# Patient Record
Sex: Male | Born: 1980 | Race: White | Hispanic: No | Marital: Single | Smoking: Former smoker
Health system: Southern US, Community
[De-identification: ages and names within clinical notes are randomized; demographics above are authoritative.]

## PROBLEM LIST (undated history)

## (undated) VITALS — BP 144/95 | HR 96 | Temp 97.9°F | Resp 18 | Ht 71.0 in | Wt 200.0 lb

## (undated) DIAGNOSIS — F319 Bipolar disorder, unspecified: Secondary | ICD-10-CM

## (undated) DIAGNOSIS — F32A Depression, unspecified: Secondary | ICD-10-CM

## (undated) DIAGNOSIS — R011 Cardiac murmur, unspecified: Secondary | ICD-10-CM

## (undated) DIAGNOSIS — F329 Major depressive disorder, single episode, unspecified: Secondary | ICD-10-CM

## (undated) DIAGNOSIS — F1910 Other psychoactive substance abuse, uncomplicated: Secondary | ICD-10-CM

---
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
## 2002-09-04 ENCOUNTER — Ambulatory Visit (HOSPITAL_COMMUNITY): Admission: RE | Admit: 2002-09-04 | Discharge: 2002-09-04 | Payer: Self-pay | Admitting: Family Medicine

## 2002-09-04 ENCOUNTER — Encounter: Payer: Self-pay | Admitting: Family Medicine

## 2003-05-18 ENCOUNTER — Inpatient Hospital Stay (HOSPITAL_COMMUNITY): Admission: EM | Admit: 2003-05-18 | Discharge: 2003-05-21 | Payer: Self-pay | Admitting: Psychiatry

## 2003-12-31 ENCOUNTER — Emergency Department (HOSPITAL_COMMUNITY): Admission: EM | Admit: 2003-12-31 | Discharge: 2003-12-31 | Payer: Self-pay

## 2006-07-13 ENCOUNTER — Ambulatory Visit: Payer: Self-pay | Admitting: Psychiatry

## 2006-07-13 ENCOUNTER — Inpatient Hospital Stay (HOSPITAL_COMMUNITY): Admission: AD | Admit: 2006-07-13 | Discharge: 2006-07-15 | Payer: Self-pay | Admitting: Psychiatry

## 2008-03-31 ENCOUNTER — Emergency Department (HOSPITAL_COMMUNITY): Admission: EM | Admit: 2008-03-31 | Discharge: 2008-03-31 | Payer: Self-pay | Admitting: Emergency Medicine

## 2009-01-22 ENCOUNTER — Emergency Department (HOSPITAL_COMMUNITY): Admission: EM | Admit: 2009-01-22 | Discharge: 2009-01-22 | Payer: Self-pay | Admitting: Emergency Medicine

## 2009-06-04 HISTORY — PX: BACK SURGERY: SHX140

## 2009-09-02 ENCOUNTER — Ambulatory Visit (HOSPITAL_COMMUNITY): Payer: Self-pay | Admitting: Psychiatry

## 2009-09-21 ENCOUNTER — Ambulatory Visit (HOSPITAL_COMMUNITY): Payer: Self-pay | Admitting: Psychiatry

## 2009-10-12 ENCOUNTER — Ambulatory Visit (HOSPITAL_COMMUNITY): Payer: Self-pay | Admitting: Psychiatry

## 2010-02-14 ENCOUNTER — Ambulatory Visit (HOSPITAL_COMMUNITY): Admission: RE | Admit: 2010-02-14 | Discharge: 2010-02-14 | Payer: Self-pay | Admitting: Psychiatry

## 2010-05-07 ENCOUNTER — Ambulatory Visit (HOSPITAL_COMMUNITY)
Admission: RE | Admit: 2010-05-07 | Discharge: 2010-05-07 | Payer: Self-pay | Source: Home / Self Care | Admitting: Psychiatry

## 2010-05-07 ENCOUNTER — Emergency Department (HOSPITAL_COMMUNITY)
Admission: EM | Admit: 2010-05-07 | Discharge: 2010-05-08 | Payer: Self-pay | Source: Home / Self Care | Admitting: Emergency Medicine

## 2010-06-25 ENCOUNTER — Encounter: Payer: Self-pay | Admitting: Family Medicine

## 2010-08-05 ENCOUNTER — Emergency Department (HOSPITAL_COMMUNITY)
Admission: EM | Admit: 2010-08-05 | Discharge: 2010-08-06 | Disposition: A | Discharge status: Home / Self Care | Payer: Medicare Other | Attending: Emergency Medicine | Admitting: Emergency Medicine

## 2010-08-05 DIAGNOSIS — S025XXA Fracture of tooth (traumatic), initial encounter for closed fracture: Secondary | ICD-10-CM | POA: Insufficient documentation

## 2010-08-05 DIAGNOSIS — Y9229 Other specified public building as the place of occurrence of the external cause: Secondary | ICD-10-CM | POA: Insufficient documentation

## 2010-08-05 DIAGNOSIS — S0083XA Contusion of other part of head, initial encounter: Secondary | ICD-10-CM | POA: Insufficient documentation

## 2010-08-05 DIAGNOSIS — S0003XA Contusion of scalp, initial encounter: Secondary | ICD-10-CM | POA: Insufficient documentation

## 2010-08-05 DIAGNOSIS — IMO0002 Reserved for concepts with insufficient information to code with codable children: Secondary | ICD-10-CM | POA: Insufficient documentation

## 2010-08-05 DIAGNOSIS — S01501A Unspecified open wound of lip, initial encounter: Secondary | ICD-10-CM | POA: Insufficient documentation

## 2010-08-06 ENCOUNTER — Emergency Department (HOSPITAL_COMMUNITY): Payer: Medicare Other

## 2010-08-15 LAB — COMPREHENSIVE METABOLIC PANEL WITH GFR
ALT: 54 U/L — ABNORMAL HIGH (ref 0–53)
AST: 30 U/L (ref 0–37)
Albumin: 4.8 g/dL (ref 3.5–5.2)
Alkaline Phosphatase: 74 U/L (ref 39–117)
BUN: 11 mg/dL (ref 6–23)
CO2: 26 meq/L (ref 19–32)
Calcium: 9.8 mg/dL (ref 8.4–10.5)
Chloride: 102 meq/L (ref 96–112)
Creatinine, Ser: 1.14 mg/dL (ref 0.4–1.5)
GFR calc Af Amer: >60 mL/min (ref >60)
GFR calc non Af Amer: >60 mL/min (ref >60)
Glucose, Bld: 93 mg/dL (ref 70–99)
Potassium: 4.6 meq/L (ref 3.5–5.1)
Sodium: 139 meq/L (ref 135–145)
Total Bilirubin: 1.4 mg/dL — ABNORMAL HIGH (ref 0.3–1.2)
Total Protein: 7.8 g/dL (ref 6.0–8.3)

## 2010-08-15 LAB — DIFFERENTIAL
Basophils Relative: 0 % (ref 0–1)
Lymphocytes Relative: 30 % (ref 12–46)
Monocytes Absolute: 0.9 10*3/uL (ref 0.1–1.0)
Monocytes Relative: 9 % (ref 3–12)
Neutro Abs: 6.2 10*3/uL (ref 1.7–7.7)
Neutrophils Relative %: 61 % (ref 43–77)

## 2010-08-15 LAB — RAPID URINE DRUG SCREEN, HOSP PERFORMED
Amphetamines: NOT DETECTED
Barbiturates: NOT DETECTED
Benzodiazepines: NOT DETECTED
Cocaine: POSITIVE — AB
Opiates: NOT DETECTED
Tetrahydrocannabinol: NOT DETECTED

## 2010-08-15 LAB — CBC
HCT: 46.5 % (ref 39.0–52.0)
Hemoglobin: 15.9 g/dL (ref 13.0–17.0)
MCH: 30.7 pg (ref 26.0–34.0)
MCHC: 34.2 g/dL (ref 30.0–36.0)
MCV: 89.8 fL (ref 78.0–100.0)
Platelets: 244 K/uL (ref 150–400)
RBC: 5.18 MIL/uL (ref 4.22–5.81)
RDW: 13.2 % (ref 11.5–15.5)
WBC: 10.2 K/uL (ref 4.0–10.5)

## 2010-10-20 NOTE — Discharge Summary (Signed)
[name], [name]              ACCOUNT NO.:  [id number]   MEDICAL RECORD NO.:  [id number]          PATIENT TYPE:  IPS   LOCATION:  0506                          FACILITY:  [facility]   PHYSICIAN:  Geoffery Lyons, M.D.      DATE OF BIRTH:  1981/04/08   DATE OF ADMISSION:  07/13/2006  DATE OF DISCHARGE:  07/15/2006                               DISCHARGE SUMMARY   CHIEF COMPLAINT AND PRESENT ILLNESS:  This was the first admission to  [facility] for this 30 year old single white male.  Presented as a walk-in.  Reported he has had a history of bipolar  disorder.  Outpatient treatment with [name] at [facility].  His medications were increased two weeks prior to  this admission despite this.  He still has increased depression,  irritability, insomnia, poor appetite.  Reporting weight loss.  Currently homeless, living in his car.  He text messaged his family  saying he was going to kill himself but he had no plan but he felt  unstable.   PAST PSYCHIATRIC HISTORY:  He was admitted in 2004.  Seeing [name]  on an outpatient basis.   ALCOHOL/DRUG HISTORY:  Smokes one-third pack of cigarettes per day.  Denies any other substances.   MEDICAL HISTORY:  Noncontributory.   MEDICATIONS:  Wellbutrin XL 300 mg in the morning, Depakote ER 1000 mg  at bedtime.   PHYSICAL EXAMINATION:  Performed and failed to show any acute findings.   LABORATORY DATA:  Not available in the chart.   MENTAL STATUS EXAM:  Alert, cooperative male.  Appropriately groomed and  dressed.  Speech was not pressured, normal rate, tempo and production.  Feeling depressed.  Affect was depressed.  Thought processes were clear,  rational and goal-oriented.  Wanted his medications to be changed.  No  active delusions.  No active suicidal or homicidal ideation.  No  hallucinations.  Cognition was well-preserved.   ADMISSION DIAGNOSES:  AXIS I:  Bipolar disorder, depressed  without  psychotic features.  AXIS II:  No diagnosis.  AXIS III:  No diagnosis.  AXIS IV:  Moderate.  AXIS V:  GAF upon admission 35; highest GAF in the last year 60.   HOSPITAL COURSE:  He was admitted.  He was started in individual and  group psychotherapy.  He refused the Depakote, started on Abilify 30 mg  per day.  He did endorse initially upon admission that he was suicidal.  Stressors were that he was homeless, kicked out from his mother's house  as he was accused by her sister that his stole $30.  Did admit to using  alcohol.  Decreased sleep, decreased appetite.  Felt Depakote was not  working.  He endorsed he felt better after one dose of Abilify 30 mg.  On July 14, 2006, he was not wanting to talk about any of his  issues.  Wanted to be discharged.  On the increased dose of Depakote, he  felt he was out of control.  It was prescribed by [name].  Doing  much better on Abilify,  motivated to pursue medications further.  Wanted  to leave as he was having to get back to his job.  Family session with  his parents.  They did say that he needed to take more responsibility  for his illness.  The family confronted him that it was very emotionally  draining when he was tries to hurt himself.  They wanted him to find a  job and be proactive in his treatments.  On July 15, 2006, he was in  full contact with reality.  There were no active suicidal or homicidal  ideation.  No hallucinations.  No delusions.  He endorsed he was not  doing very well on the Depakote.  Also he was with friends.  He drank  three beers.  Endorsed he is not used to drinking and this might set the  stage to have more mood fluctuation.  Stopped feeling like killing  himself.  Denied suicidal ideation.  Endorsed that he really did not  want to hurt himself.  Felt that the Abilify was working well.  He felt  he was ready to go.  Needed to get a job.  He was going to stay with a  friend.  Endorsed that he  was going to be in a stable place.   DISCHARGE DIAGNOSES:  AXIS I:  Bipolar disorder, depressed.  AXIS II:  No diagnosis.  AXIS III:  No diagnosis.  AXIS IV:  Moderate.  AXIS V:  GAF upon discharge 55-60.   DISCHARGE MEDICATIONS:  1. Abilify 30 mg per day.  2. Wellbutrin XL 300 mg in the morning.   FOLLOWUP:  [facility].      Geoffery Lyons, M.D.  Electronically Signed     IL/MEDQ  D:  08/05/2006  T:  08/06/2006  Job:  [id number]

## 2010-10-20 NOTE — H&P (Signed)
[name], [name]              ACCOUNT NO.:  [id number]   MEDICAL RECORD NO.:  [id number]          PATIENT TYPE:  IPS   LOCATION:  0506                          FACILITY:  BH   PHYSICIAN:  Geoffery Lyons, M.D.      DATE OF BIRTH:  10-11-1980   DATE OF ADMISSION:  07/13/2006  DATE OF DISCHARGE:                       PSYCHIATRIC ADMISSION ASSESSMENT   DIAGNOSES:   AXIS I:  Bipolar, depressed, without psychotic features.   AXIS II:  Deferred.   AXIS III:  None known.   AXIS IV:  Severe, homeless, unemployed.   AXIS V:  Thirty-five.   This is a voluntary admission to the services of Dr. Geoffery Lyons.   IDENTIFYING INFORMATION:  This is a 30 year old, single, white male.  He  presented as a walk-in last evening.  He reported that he has a history  for bipolar disorder.  He is on outpatient treatment with [name] at  [facility].  He states his meds were increased two  weeks ago and despite this, he still has increased depression,  irritability, insomnia, and poor appetite, and he is reporting weight  loss.  He is currently homeless and living in his car.  Apparently he  text-messaged his family saying he was going to kill himself, but he had  no plan, but he felt unstable.   PAST PSYCHIATRIC HISTORY:  He was here in 2004.  As already stated, he  is in outpatient therapy with [name] at [facility].   SOCIAL HISTORY:  He completed high school in '02, he has never married,  he has no children.  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   FAMILY HISTORY:  He states that both sides have depression.   ALCOHOL AND DRUG HISTORY:  He acknowledges smoking one-third pack of  cigarettes per day.   MEDICAL CARE [name]:  Dr. Tiburcio Pea at [place]; his psychiatrist is Dr.  Allyne Gee at [facility].   He has  no known medical problems.   CURRENTLY PRESCRIBED MEDICATIONS:  1. Wellbutrin-XL 300 mg p.o. q.a.m.  2. Depakote-ER 1000 mg at h.s.   DRUG ALLERGIES:  No known drug allergies.   PHYSICAL EXAMINATION:  GENERAL:  A well-developed, well-nourished, white  male in no acute distress.  He does not appear to have worrisome weight  loss.  He had no positive physical findings.  VITAL SIGNS:  His vital  signs on admission show he is 71 inches tall, he weighs 165, his  temperature is 97.2, blood pressure is 126/81, pulse is 85.   I have a few labs.  His CBC was within normal limits, and his other labs  are currently pending.   MENTAL STATUS EXAM:  Today, he is alert and oriented x3.  He is  appropriately groomed, dressed, and nourished.  His speech is not  pressured.  He reports feeling depressed 3 out of a scale of 10.  His  affect has a normal  range.  His thought processes are clear, rational,  and goal oriented.  He wants to change medications.  Judgment and  insight are intact.  Concentration and memory are intact.  Intelligence  is at least average.  He denies being suicidal or homicidal at this  time.  He denies auditory or visual hallucinations.   PLAN:  Admit for safety and stabilization.  As he has taken Wellbutrin  and Depakote for over a year and despite recent increase in dosage, he  denies any response to these meds.  We will change them; also, he  refuses to take them.  We will try Abilify and will have a family  session with the counselor for discharge planning.      Mickie Leonarda Salon, P.A.-C.    ______________________________  Geoffery Lyons, M.D.    MD/MEDQ  D:  07/13/2006  T:  07/13/2006  Job:  [id number]

## 2010-10-20 NOTE — Discharge Summary (Signed)
NAME:  [name], [name]                        ACCOUNT NO.:  [id number]   MEDICAL RECORD NO.:  [id number]                   PATIENT TYPE:  IPS   LOCATION:  0505                                 FACILITY:  BH   PHYSICIAN:  Geoffery Lyons, M.D.                   DATE OF BIRTH:  15-Sep-1980   DATE OF ADMISSION:  05/18/2003  DATE OF DISCHARGE:  05/21/2003                                 DISCHARGE SUMMARY   CHIEF COMPLAINT AND PRESENT ILLNESS:  This was the first admission to [facility] for this 30 year old single white male.  He  presented accompanied by the parents.  He told them he needed to go to the  hospital, feeling agitated.  He was fed up with life.  He felt that he would  hurt himself.  He endorsed depression and hopelessness after learning the  day before that he was rejected from [facility].   PAST PSYCHIATRIC HISTORY:  This was the first time at [facility].  He had seen Christene Slates. Katrinka Blazing, M.D., on an outpatient basis.  He was  in [facility] at age 27 and diagnosed bipolar.   SUBSTANCE ABUSE HISTORY:  Occasional use of alcohol; claimed no persistent  use.  No other substances.   PAST MEDICAL HISTORY:  Noncontributory.   MEDICATIONS:  1. Paxil 20 mg per day.  2. Topamax 100 mg at night.  3. Risperdal 2 mg twice a day.   PHYSICAL EXAMINATION:  Physical examination was performed, failed to show  any acute findings.   MENTAL STATUS EXAM:  Mental status exam revealed a fully alert, calm,  focused, polite, cooperative male.  Speech: Normal pace, tone.  Mood:  Depressed, frustrated.  Affect: Depressed.  Thought processes: Logical,  coherent, and relevant.  No evidence of delusions, positive suicidal  ideation without a plan, no homicidal ideas.  Cognitive: Cognition was well  preserved.   ADMISSION DIAGNOSES:   AXIS I:  1. Bipolar disorder.  2. Mood disorder, not otherwise specified.   AXIS II:  No diagnosis.   AXIS III:  No  diagnosis.   AXIS IV:  Moderate.   AXIS V:  Global assessment of functioning upon admission 36, highest global  assessment of functioning in the last year 65.   LABORATORY DATA:  CBC was within normal limits.  Blood chemistries were  within normal limits.  Hemoglobin A2 2.6, hemoglobin F 0.  Lipid profile:  Within normal limits.   HOSPITAL COURSE:  He was admitted and started in intensive individual and  group psychotherapy.  He was given Risperdal 2 mg twice a day and Paxil 20  mg per day.  He was placed on Topamax 100 mg at night.  He did endorse a  past history of irritability, loss of control, anger for not being able to  go to a rest home.  He has had other  episodes of anger and loss of control,  intimidating toward other people.  Still willing to be placed, expressed  some increased insight in terms of the need to learn of ways to control his  emotions.  He did quite well and on December 17, he was in full contact with  reality.  There were no suicidal ideas, no homicidal ideas, no  hallucinations, no delusions.  He was going to stay with the parents and  eventually make it to go to a group home.  He was positive about his plan,  had worked on Counsellor and anger management.  As  he was not suicidal and there was evidence of improvement, we went ahead and  discharged to outpatient followup.   DISCHARGE DIAGNOSES:   AXIS I:  Mood disorder, not otherwise specified.   AXIS II:  No diagnosis.   AXIS III:  No diagnosis.   AXIS IV:  Moderate.   AXIS V:  Global assessment of functioning upon discharge 50.   DISCHARGE MEDICATIONS:  1. Risperdal 2 mg twice a day.  2. Paxil 20 mg a day.  3. Topamax 100 mg at night.   FOLLOW UP:  He was to follow up with Christene Slates. Katrinka Blazing, M.D.                                               Geoffery Lyons, M.D.    IL/MEDQ  D:  06/16/2003  T:  06/17/2003  Job:  [id number]

## 2010-10-22 ENCOUNTER — Emergency Department (HOSPITAL_COMMUNITY)
Admission: EM | Admit: 2010-10-22 | Discharge: 2010-10-22 | Disposition: A | Discharge status: Home / Self Care | Payer: Medicare Other | Attending: Emergency Medicine | Admitting: Emergency Medicine

## 2010-10-22 ENCOUNTER — Emergency Department (HOSPITAL_COMMUNITY): Payer: Medicare Other

## 2010-10-22 DIAGNOSIS — W268XXA Contact with other sharp object(s), not elsewhere classified, initial encounter: Secondary | ICD-10-CM | POA: Insufficient documentation

## 2010-10-22 DIAGNOSIS — F3289 Other specified depressive episodes: Secondary | ICD-10-CM | POA: Insufficient documentation

## 2010-10-22 DIAGNOSIS — IMO0002 Reserved for concepts with insufficient information to code with codable children: Secondary | ICD-10-CM | POA: Insufficient documentation

## 2010-10-22 DIAGNOSIS — F329 Major depressive disorder, single episode, unspecified: Secondary | ICD-10-CM | POA: Insufficient documentation

## 2010-10-22 DIAGNOSIS — Y92009 Unspecified place in unspecified non-institutional (private) residence as the place of occurrence of the external cause: Secondary | ICD-10-CM | POA: Insufficient documentation

## 2011-08-06 ENCOUNTER — Emergency Department (HOSPITAL_COMMUNITY)
Admission: EM | Admit: 2011-08-06 | Discharge: 2011-08-06 | Disposition: A | Discharge status: Home / Self Care | Payer: Medicare Other | Attending: Emergency Medicine | Admitting: Emergency Medicine

## 2011-08-06 ENCOUNTER — Ambulatory Visit (HOSPITAL_COMMUNITY)
Admission: RE | Admit: 2011-08-06 | Discharge: 2011-08-06 | Disposition: A | Discharge status: Home / Self Care | Payer: Medicare Other | Source: Home / Self Care | Attending: Psychiatry | Admitting: Psychiatry

## 2011-08-06 ENCOUNTER — Encounter (HOSPITAL_COMMUNITY): Payer: Self-pay | Admitting: *Deleted

## 2011-08-06 DIAGNOSIS — F3289 Other specified depressive episodes: Secondary | ICD-10-CM | POA: Insufficient documentation

## 2011-08-06 DIAGNOSIS — F329 Major depressive disorder, single episode, unspecified: Secondary | ICD-10-CM | POA: Insufficient documentation

## 2011-08-06 DIAGNOSIS — F1410 Cocaine abuse, uncomplicated: Secondary | ICD-10-CM | POA: Insufficient documentation

## 2011-08-06 HISTORY — DX: Bipolar disorder, unspecified: F31.9

## 2011-08-06 LAB — RAPID URINE DRUG SCREEN, HOSP PERFORMED
Amphetamines: NOT DETECTED
Barbiturates: NOT DETECTED
Benzodiazepines: NOT DETECTED
Cocaine: POSITIVE — AB
Opiates: NOT DETECTED
Tetrahydrocannabinol: NOT DETECTED

## 2011-08-06 LAB — COMPREHENSIVE METABOLIC PANEL
ALT: 12 U/L (ref 0–53)
Alkaline Phosphatase: 81 U/L (ref 39–117)
BUN: 8 mg/dL (ref 6–23)
GFR calc Af Amer: >90 mL/min (ref >90)
Potassium: 4.3 mEq/L (ref 3.5–5.1)
Sodium: 140 mEq/L (ref 135–145)
Total Protein: 7.4 g/dL (ref 6.0–8.3)

## 2011-08-06 LAB — ETHANOL: Alcohol, Ethyl (B): <11 mg/dL (ref 0–11)

## 2011-08-06 LAB — CBC
HCT: 45.9 % (ref 39.0–52.0)
Hemoglobin: 15.5 g/dL (ref 13.0–17.0)
MCH: 30.8 pg (ref 26.0–34.0)
MCHC: 33.8 g/dL (ref 30.0–36.0)
MCV: 91.1 fL (ref 78.0–100.0)
Platelets: 282 K/uL (ref 150–400)
RBC: 5.04 MIL/uL (ref 4.22–5.81)
RDW: 13.3 % (ref 11.5–15.5)
WBC: 7.5 K/uL (ref 4.0–10.5)

## 2011-08-06 LAB — DIFFERENTIAL
Basophils Absolute: 0 K/uL (ref 0.0–0.1)
Basophils Relative: 0 % (ref 0–1)
Eosinophils Absolute: 0.1 K/uL (ref 0.0–0.7)
Eosinophils Relative: 2 % (ref 0–5)
Lymphocytes Relative: 24 % (ref 12–46)
Lymphs Abs: 1.8 K/uL (ref 0.7–4.0)
Monocytes Absolute: 0.3 K/uL (ref 0.1–1.0)
Monocytes Relative: 5 % (ref 3–12)
Neutro Abs: 5.2 K/uL (ref 1.7–7.7)
Neutrophils Relative %: 69 % (ref 43–77)

## 2011-08-06 MED ORDER — ACETAMINOPHEN 325 MG PO TABS: 650.0000 mg | ORAL_TABLET | ORAL | Status: DC | PRN

## 2011-08-06 MED ORDER — LORAZEPAM 1 MG PO TABS
1.0000 mg | ORAL_TABLET | Freq: Three times a day (TID) | ORAL | Status: DC | PRN
  Administered 2011-08-06: 1 mg via ORAL
  Filled 2011-08-06: qty 1

## 2011-08-06 MED ORDER — NICOTINE 21 MG/24HR TD PT24: 21.0000 mg | MEDICATED_PATCH | Freq: Every day | TRANSDERMAL | Status: DC

## 2011-08-06 MED ORDER — ESCITALOPRAM OXALATE 10 MG PO TABS: 10.0000 mg | ORAL_TABLET | Freq: Every day | ORAL | Status: DC

## 2011-08-06 MED ORDER — ZOLPIDEM TARTRATE 5 MG PO TABS: 5.0000 mg | ORAL_TABLET | Freq: Every evening | ORAL | Status: DC | PRN

## 2011-08-06 MED ORDER — IBUPROFEN 600 MG PO TABS: 600.0000 mg | ORAL_TABLET | Freq: Three times a day (TID) | ORAL | Status: DC | PRN

## 2011-08-06 MED ORDER — ONDANSETRON HCL 4 MG PO TABS: 4.0000 mg | ORAL_TABLET | Freq: Three times a day (TID) | ORAL | Status: DC | PRN

## 2011-08-06 NOTE — BH Assessment (Signed)
Assessment Note   [name] is an 31 y.o. male. PT PRESENTS WITH INCREASE DEPRESSION & SUICIDAL THOUGHTS WITH A PLAN TO OVERDOSE. PT EXPRESSED THAT HE FELT LIKE HE HAS HIT ROCK BOTTOM AFTER GIRLFRIEND OF 4 YRS BROKE UP WITH HIM & HIM WHICH DURING THAT TIME HE WAS ABUSING COCAINE TO COPE WITH NOT BEING ON MEDS. PT STATES HE ALSO JUST RECENTLY LOST HIS JOBS WHICH HAS ALSO CAUSE FINANCIAL STRAIN. PT WAS EMOTIONAL & TEARFUL, FEELS HELPLESS AS WELL AS LONELY. PT ADMITS HE HAS NT BEEN COMPLAINT WITH MEDS. PT SAYS HE HAS BEEN SUICIDAL X 3 WEEKS & RECENTLY FOUND A BOTTLE OF RESPERIDAL WHICH HE WANTED TO  OVERDOSE ON. PT SAYS HE DRINKS ETOH OCCASIONALLY ON WEEKENDS 2-3(24OZ) & THC 3(JOINTS); PT SAYS LAST USE OF COCAINE WAS 2 YRS AGO. PT DENIES HI OR AV. PT EXPRESSED THAT HE COULD NOT CONTRACT FOR SAFETY BUT ADMITS TO UNSTABLE MOODS. PT DENIES HI OR AV.   Axis I: Bipolar, mixed and Substance Induced Mood Disorder Axis II: Deferred Axis III: No past medical history on file. Axis IV: economic problems, occupational problems, other psychosocial or environmental problems, problems related to social environment and problems with primary support group Axis V: 11-20 some danger of hurting self or others possible OR occasionally fails to maintain minimal personal hygiene OR gross impairment in communication  Past Medical History: No past medical history on file.  No past surgical history on file.  Family History: No family history on file.  Social History:  does not have a smoking history on file. He does not have any smokeless tobacco history on file. His alcohol and drug histories not on file.  Additional Social History:    Allergies: Allergies not on file  Home Medications:  No current facility-administered medications on file as of 08/06/2011.   No current outpatient prescriptions on file as of 08/06/2011.    OB/GYN Status:  No LMP for male patient.  General Assessment Data Location of  Assessment: [facility] Living Arrangements: Alone Can pt return to current living arrangement?: Yes Admission Status: Voluntary Is patient capable of signing voluntary admission?: Yes Transfer from: Acute Hospital Referral Source: Self/Family/Friend     Risk to self Suicidal Ideation: Yes-Currently Present Suicidal Intent: Yes-Currently Present Is patient at risk for suicide?: Yes Suicidal Plan?: Yes-Currently Present Specify Current Suicidal Plan: OVERDOSE ON PILLS Access to Means: Yes Specify Access to Suicidal Means: PILLS What has been your use of drugs/alcohol within the last 12 months?: PT ADMITS TO OCCASIONAL THC & LAS T USE WAS 2 WEEKS AGO; ETOH USE WHICH HE DRINKS 3(24OZ) ON WEEKENDS & LAST USE WAS OVER THE WEEKEND Previous Attempts/Gestures: Yes How many times?: 8  Other Self Harm Risks: NA Triggers for Past Attempts: Unpredictable;Other personal contacts Intentional Self Injurious Behavior: None Family Suicide History: Unknown Recent stressful life event(s): Loss (Comment);Financial Problems;Turmoil (Comment) Persecutory voices/beliefs?: No Depression: Yes Depression Symptoms: Loss of interest in usual pleasures;Feeling worthless/self pity Substance abuse history and/or treatment for substance abuse?: Yes Suicide prevention information given to non-admitted patients: Not applicable  Risk to Others Homicidal Ideation: No Thoughts of Harm to Others: No Current Homicidal Intent: No Current Homicidal Plan: No Access to Homicidal Means: No Identified Victim: NA History of harm to others?: No Assessment of Violence: None Noted Violent Behavior Description: CALM, COOPERATIVE, EMOTIONAL & TEARFUL Does patient have access to weapons?: No Criminal Charges Pending?: No Does patient have a court date: No  Psychosis Hallucinations: None noted  Delusions: None noted  Mental Status Report Appear/Hygiene: Body odor Eye Contact: Good Motor Activity: Freedom  of movement Speech: Logical/coherent Level of Consciousness: Alert Mood: Depressed;Helpless;Sad Affect: Depressed;Sad Anxiety Level: Moderate Thought Processes: Coherent;Relevant Judgement: Unimpaired Orientation: Person;Place;Time;Situation Obsessive Compulsive Thoughts/Behaviors: None  Cognitive Functioning Concentration: Decreased Memory: Recent Intact;Remote Intact IQ: Average Insight: Poor Impulse Control: Poor Appetite: Fair Weight Loss: 0  Weight Gain: 0  Sleep: Decreased Total Hours of Sleep: 4  Vegetative Symptoms: None  Prior Inpatient Therapy Prior Inpatient Therapy: Yes Prior Therapy Dates: UNK Prior Therapy Facilty/Provider(s): [facility]; [place] Reason for Treatment: STABILIZATION  Prior Outpatient Therapy Prior Outpatient Therapy: No Prior Therapy Dates: NA Prior Therapy Facilty/Provider(s): NA Reason for Treatment: NA                     Additional Information 1:1 In Past 12 Months?: No CIRT Risk: No Elopement Risk: No Does patient have medical clearance?: Yes     Disposition:  Disposition Disposition of Patient: Inpatient treatment program;Referred to (ACCEPTED BY Rogelia Rohrer, NP PENDING MEDICAL CLEARANCE) Type of inpatient treatment program: Adult  On Site Evaluation by:   Reviewed with Physician:     Waldron Session 08/06/2011 1:40 PM

## 2011-08-06 NOTE — ED Notes (Signed)
Pt initially requested to leave, now says he wants to stay and get treatment, pt tearful/crying, feels like he has let his family down because he relasped on drugs and now has lost his girlfriend who he wants to marry.

## 2011-08-06 NOTE — ED Notes (Signed)
C/o +SI earlier today but at this time is no longer SI but does states he has increased depression. All this began about 3 weeks ago b/c he and his GF broke up, he denies Si or HI at this time as well as AV/H. Has been on Risperdal 2 mg every day. Is pleasant and cooperative. Hopes he can get things worked out w/his GF., needs to change his living arrangements, is FT on his job but needs to change to PT.

## 2011-08-06 NOTE — ED Provider Notes (Signed)
History     CSN: [id number]  Arrival date & time 08/06/11  1325   First MD Initiated Contact with Patient 08/06/11 1504      Chief Complaint  Patient presents with  . Medical Clearance    The history is provided by the patient.   patient reports a long-standing history of depression bipolar disorder.  He is currently on rest for down 2 mg daily.  He reports recently breaking up with his girlfriend and 4 days ago lost his current employment.  He reports ongoing depression and vague suicidal thoughts today.  He has no plan.  He reports "I'll never kill myself because I'm too chicken".  He denies daily alcohol use.  He denies drug abuse.  He comes the ER today requesting help with his depression and his medications.  Past Medical History  Diagnosis Date  . Bipolar 1 disorder     Past Surgical History  Procedure Date  . Back surgery     History reviewed. No pertinent family history.  History  Substance Use Topics  . Smoking status: Current Everyday Smoker  . Smokeless tobacco: Never Used  . Alcohol Use: Yes     occa      Review of Systems  All other systems reviewed and are negative.    Allergies  Review of patient's allergies indicates no known allergies.  Home Medications   Current Outpatient Rx  Name Route Sig Dispense Refill  . ALBUTEROL SULFATE HFA 108 (90 BASE) MCG/ACT IN AERS Inhalation Inhale 2 puffs into the lungs every 6 (six) hours as needed. For shortness of breath    . RISPERIDONE 2 MG PO TABS Oral Take 2 mg by mouth daily.    Marland Kitchen VITAMIN C 500 MG PO TABS Oral Take 500 mg by mouth daily.      BP 135/84  Pulse 68  Temp(Src) 97.7 F (36.5 C) (Oral)  Resp 20  Ht 5\' 11"  (1.803 m)  Wt 170 lb (77.111 kg)  BMI 23.71 kg/m2  SpO2 99%  Physical Exam  Nursing note and vitals reviewed. Constitutional: He is oriented to person, place, and time. He appears well-developed and well-nourished.  HENT:  Head: Normocephalic and atraumatic.  Eyes: EOM are  normal.  Neck: Normal range of motion.  Cardiovascular: Normal rate, regular rhythm, normal heart sounds and intact distal pulses.   Pulmonary/Chest: Effort normal and breath sounds normal. No respiratory distress.  Abdominal: Soft. He exhibits no distension. There is no tenderness.  Musculoskeletal: Normal range of motion.  Neurological: He is alert and oriented to person, place, and time.  Skin: Skin is warm and dry.  Psychiatric: Judgment normal.       Flat affect    ED Course  Procedures (including critical care time)  Labs Reviewed  URINE RAPID DRUG SCREEN (HOSP PERFORMED) - Abnormal; Notable for the following:    Cocaine POSITIVE (*)    All other components within normal limits  ETHANOL  CBC  DIFFERENTIAL  COMPREHENSIVE METABOLIC PANEL   No results found.   1. Depression 2. Cocaine abuse    MDM  Depression with vague SI.  Am not completely convinced this patient will require admission to behavioral health this am not convinced he is a threat to himself or to others.  I will ask the the psychiatrist to evaluate the patient  10:44 PM The patient was evaluated by Dr Sela Hua MD, telepsychiatry.  His recommendations are as follows X. #1 discharge to self #2 followup with  mental health #3 continuous results milligrams daily #4 start Lexapro 10 mg daily        Lyanne Co, MD 08/06/11 2248

## 2011-08-06 NOTE — ED Notes (Signed)
Pt states he has started to be depressed and si thoughts. Pt denies hi. Pt states he does not have a plan he wants help. Pt states he went to b[facility] he was accepted but does not have a bed

## 2011-09-19 ENCOUNTER — Telehealth (HOSPITAL_COMMUNITY): Payer: Self-pay

## 2011-09-19 NOTE — BH Assessment (Signed)
Assessment Note      [name] is an 31 y.o. male. PT PRESENTS WITH INCREASE DEPRESSION & SUICIDAL THOUGHTS WITH A PLAN TO OVERDOSE. PT EXPRESSED THAT HE FELT LIKE HE HAS HIT ROCK BOTTOM AFTER GIRLFRIEND OF 4 YRS BROKE UP WITH HIM & HIM WHICH DURING THAT TIME HE WAS ABUSING COCAINE TO COPE WITH NOT BEING ON MEDS. PT STATES HE ALSO JUST RECENTLY LOST HIS JOBS WHICH HAS ALSO CAUSE FINANCIAL STRAIN. PT WAS EMOTIONAL & TEARFUL, FEELS HELPLESS AS WELL AS LONELY. PT ADMITS HE HAS NT BEEN COMPLAINT WITH MEDS. PT SAYS HE HAS BEEN SUICIDAL X 3 WEEKS & RECENTLY FOUND A BOTTLE OF RESPERIDAL WHICH HE WANTED TO OVERDOSE ON. PT SAYS HE DRINKS ETOH OCCASIONALLY ON WEEKENDS 2-3(24OZ) & THC 3(JOINTS); PT SAYS LAST USE OF COCAINE WAS 2 YRS AGO. PT DENIES HI OR AV. PT EXPRESSED THAT HE COULD NOT CONTRACT FOR SAFETY BUT ADMITS TO UNSTABLE MOODS. PT DENIES HI OR AV.  Axis I: Bipolar, mixed and Substance Induced Mood Disorder  Axis II: Deferred  Axis III: No past medical history on file.  Axis IV: economic problems, occupational problems, other psychosocial or environmental problems, problems related to social environment and problems with primary support group  Axis V: 11-20 some danger of hurting self or others possible OR occasionally fails to maintain minimal personal hygiene OR gross impairment in communication  Past Medical History: No past medical history on file.  No past surgical history on file.  Family History: No family history on file.  Social History: does not have a smoking history on file. He does not have any smokeless tobacco history on file. His alcohol and drug histories not on file.  Additional Social History:  Allergies: Allergies not on file  Home Medications:   No current facility-administered medications on file as of 08/06/2011.   No current outpatient prescriptions on file as of 08/06/2011.    OB/GYN Status: No LMP for male patient.  General Assessment Data  Location of Assessment: [facility]  Assessment Services  Living Arrangements: Alone  Can pt return to current living arrangement?: Yes  Admission Status: Voluntary  Is patient capable of signing voluntary admission?: Yes  Transfer from: Acute Hospital  Referral Source: Self/Family/Friend   Risk to self  Suicidal Ideation: Yes-Currently Present  Suicidal Intent: Yes-Currently Present  Is patient at risk for suicide?: Yes  Suicidal Plan?: Yes-Currently Present  Specify Current Suicidal Plan: OVERDOSE ON PILLS  Access to Means: Yes  Specify Access to Suicidal Means: PILLS  What has been your use of drugs/alcohol within the last 12 months?: PT ADMITS TO OCCASIONAL THC & LAS T USE WAS 2 WEEKS AGO; ETOH USE WHICH HE DRINKS 3(24OZ) ON WEEKENDS & LAST USE WAS OVER THE WEEKEND  Previous Attempts/Gestures: Yes  How many times?: 8  Other Self Harm Risks: NA  Triggers for Past Attempts: Unpredictable;Other personal contacts  Intentional Self Injurious Behavior: None  Family Suicide History: Unknown  Recent stressful life event(s): Loss (Comment);Financial Problems;Turmoil (Comment)  Persecutory voices/beliefs?: No  Depression: Yes  Depression Symptoms: Loss of interest in usual pleasures;Feeling worthless/self pity  Substance abuse history and/or treatment for substance abuse?: Yes  Suicide prevention information given to non-admitted patients: Not applicable  Risk to Others  Homicidal Ideation: No  Thoughts of Harm to Others: No  Current Homicidal Intent: No  Current Homicidal Plan: No  Access to Homicidal Means: No  Identified Victim: NA  History of harm to others?: No  Assessment of  Violence: None Noted  Violent Behavior Description: CALM, COOPERATIVE, EMOTIONAL & TEARFUL  Does patient have access to weapons?: No  Criminal Charges Pending?: No  Does patient have a court date: No  Psychosis  Hallucinations: None noted  Delusions: None noted  Mental Status Report  Appear/Hygiene: Body odor  Eye Contact: Good  Motor  Activity: Freedom of movement  Speech: Logical/coherent  Level of Consciousness: Alert  Mood: Depressed;Helpless;Sad  Affect: Depressed;Sad  Anxiety Level: Moderate  Thought Processes: Coherent;Relevant  Judgement: Unimpaired  Orientation: Person;Place;Time;Situation  Obsessive Compulsive Thoughts/Behaviors: None  Cognitive Functioning  Concentration: Decreased  Memory: Recent Intact;Remote Intact  IQ: Average  Insight: Poor  Impulse Control: Poor  Appetite: Fair  Weight Loss: 0  Weight Gain: 0  Sleep: Decreased  Total Hours of Sleep: 4  Vegetative Symptoms: None  Prior Inpatient Therapy  Prior Inpatient Therapy: Yes  Prior Therapy Dates: UNK  Prior Therapy Facilty/Provider(s): [facility]; [place]  Reason for Treatment: STABILIZATION  Prior Outpatient Therapy  Prior Outpatient Therapy: No  Prior Therapy Dates: NA  Prior Therapy Facilty/Provider(s): NA  Reason for Treatment: NA          Additional Information  1:1 In Past 12 Months?: No  CIRT Risk: No  Elopement Risk: No  Does patient have medical clearance?: Yes   Disposition:  Disposition  Disposition of Patient: Inpatient treatment program;Referred to (ACCEPTED BY Rogelia Rohrer, NP PENDING MEDICAL CLEARANCE)  Type of inpatient treatment program: Adult  On Site Evaluation by:  Reviewed with Physician:  Waldron Session  08/06/2011 1:40 PM

## 2012-11-01 ENCOUNTER — Encounter (HOSPITAL_COMMUNITY): Payer: Self-pay | Admitting: *Deleted

## 2012-11-01 ENCOUNTER — Telehealth (HOSPITAL_COMMUNITY): Payer: Self-pay | Admitting: *Deleted

## 2012-11-01 ENCOUNTER — Encounter (HOSPITAL_COMMUNITY): Payer: Self-pay

## 2012-11-01 ENCOUNTER — Inpatient Hospital Stay (HOSPITAL_COMMUNITY)
Admission: AD | Admit: 2012-11-01 | Discharge: 2012-11-03 | DRG: 885 | Disposition: A | Discharge status: Home / Self Care | Payer: Medicare Other | Attending: Psychiatry | Admitting: Psychiatry

## 2012-11-01 DIAGNOSIS — F411 Generalized anxiety disorder: Secondary | ICD-10-CM

## 2012-11-01 DIAGNOSIS — Z79899 Other long term (current) drug therapy: Secondary | ICD-10-CM

## 2012-11-01 DIAGNOSIS — F3130 Bipolar disorder, current episode depressed, mild or moderate severity, unspecified: Principal | ICD-10-CM | POA: Diagnosis present

## 2012-11-01 DIAGNOSIS — F319 Bipolar disorder, unspecified: Secondary | ICD-10-CM

## 2012-11-01 LAB — CBC WITH DIFFERENTIAL/PLATELET
Basophils Absolute: 0 10*3/uL (ref 0.0–0.1)
Eosinophils Absolute: 0.2 10*3/uL (ref 0.0–0.7)
Eosinophils Relative: 2 % (ref 0–5)
HCT: 45 % (ref 39.0–52.0)
Lymphocytes Relative: 33 % (ref 12–46)
MCH: 30.5 pg (ref 26.0–34.0)
MCV: 92.6 fL (ref 78.0–100.0)
Monocytes Absolute: 0.8 10*3/uL (ref 0.1–1.0)
Platelets: 235 10*3/uL (ref 150–400)
RDW: 13.1 % (ref 11.5–15.5)
WBC: 9.1 10*3/uL (ref 4.0–10.5)

## 2012-11-01 MED ORDER — ACETAMINOPHEN 325 MG PO TABS: 650.0000 mg | ORAL_TABLET | Freq: Four times a day (QID) | ORAL | Status: DC | PRN

## 2012-11-01 MED ORDER — ALUM & MAG HYDROXIDE-SIMETH 200-200-20 MG/5ML PO SUSP: 30.0000 mL | ORAL | Status: DC | PRN

## 2012-11-01 MED ORDER — VITAMIN C 500 MG PO TABS
500.0000 mg | ORAL_TABLET | Freq: Every day | ORAL | Status: DC
  Administered 2012-11-01 – 2012-11-03 (×3): 500 mg via ORAL
  Filled 2012-11-01 (×7): qty 1

## 2012-11-01 MED ORDER — GABAPENTIN 100 MG PO CAPS
100.0000 mg | ORAL_CAPSULE | Freq: Three times a day (TID) | ORAL | Status: DC
  Administered 2012-11-01 – 2012-11-03 (×6): 100 mg via ORAL
  Filled 2012-11-01 (×4): qty 1
  Filled 2012-11-01 (×3): qty 9
  Filled 2012-11-01 (×7): qty 1

## 2012-11-01 MED ORDER — DIVALPROEX SODIUM 500 MG PO DR TAB
500.0000 mg | DELAYED_RELEASE_TABLET | Freq: Every day | ORAL | Status: DC
  Administered 2012-11-01 – 2012-11-03 (×3): 500 mg via ORAL
  Filled 2012-11-01: qty 3
  Filled 2012-11-01 (×6): qty 1

## 2012-11-01 MED ORDER — ESCITALOPRAM OXALATE 10 MG PO TABS
10.0000 mg | ORAL_TABLET | Freq: Every day | ORAL | Status: DC
  Administered 2012-11-01 – 2012-11-03 (×3): 10 mg via ORAL
  Filled 2012-11-01 (×5): qty 1
  Filled 2012-11-01: qty 3
  Filled 2012-11-01 (×2): qty 1

## 2012-11-01 MED ORDER — MAGNESIUM HYDROXIDE 400 MG/5ML PO SUSP: 30.0000 mL | Freq: Every day | ORAL | Status: DC | PRN

## 2012-11-01 MED ORDER — TRAZODONE HCL 50 MG PO TABS
50.0000 mg | ORAL_TABLET | Freq: Every evening | ORAL | Status: DC | PRN
  Filled 2012-11-01: qty 3

## 2012-11-01 MED ORDER — NICOTINE 21 MG/24HR TD PT24
21.0000 mg | MEDICATED_PATCH | Freq: Every day | TRANSDERMAL | Status: DC
  Filled 2012-11-01 (×5): qty 1

## 2012-11-01 MED ORDER — RISPERIDONE 1 MG PO TABS
1.0000 mg | ORAL_TABLET | Freq: Every day | ORAL | Status: DC
  Administered 2012-11-02: 1 mg via ORAL
  Filled 2012-11-01 (×3): qty 1

## 2012-11-01 MED ORDER — ALBUTEROL SULFATE HFA 108 (90 BASE) MCG/ACT IN AERS: 2.0000 | INHALATION_SPRAY | Freq: Four times a day (QID) | RESPIRATORY_TRACT | Status: DC | PRN

## 2012-11-01 MED ORDER — RISPERIDONE 2 MG PO TABS
2.0000 mg | ORAL_TABLET | Freq: Every day | ORAL | Status: DC
  Filled 2012-11-01 (×2): qty 1

## 2012-11-01 MED ORDER — RISPERIDONE 0.5 MG PO TABS
0.5000 mg | ORAL_TABLET | Freq: Two times a day (BID) | ORAL | Status: DC
  Administered 2012-11-01 – 2012-11-03 (×4): 0.5 mg via ORAL
  Filled 2012-11-01 (×2): qty 1
  Filled 2012-11-01: qty 12
  Filled 2012-11-01 (×5): qty 1
  Filled 2012-11-01: qty 12
  Filled 2012-11-01: qty 1

## 2012-11-01 NOTE — BH Assessment (Deleted)
Assessment Note   [name] is a 32 y.o. single white male.  He goes by "[name]."  He presents to [facility] unaccompanied complaining of SI.  Pt reports several recent stressors, mostly related to relocating from [place] to [place] in 09/2012 after having moved there with his girlfriend in 01/2012.  Their relationship had persisted for 5 years, but they broke up around the same time that pt moved back to [place].  Pt attributes the break-up to excessive partying on his part.  Pt decided to return to [place] and move in with his father to be a support for him after his mother left the father for another man.  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  As a result, pt states, "I'm just tired of everything."  Pt reports that he has had SI "since I was 32 years old."  Recently he has had a plan to hang himself or overdose "and just not wake up."  He has never made a suicide attempt or engaged in self-mutilation.  When asked what prompted him to seek treatment rather than hurt himself today, pt states, "I know that things are going to get better.  There's a light at the end of the tunnel, I just can't see it yet."  Pt endorses depressed mood with symptoms noted in the "risk to self" assessment below.  He also reports problems with anxiety, increasing at times to near-panic level.  Pt denies HI or physical aggression, and is cooperative and calm, other than fidgeting with his cap.  Pt denies any problems with hallucinations, and he exhibits no signs of delusional thought or internal stimuli; his reality testing appears to be intact.  Pt denies any current substance abuse problems, but later divulged that he had used a gram of  cocaine last night and the night before.  He does not appear to be intoxicated or in withdrawal at this time.  Pt identifies his father as his only social support.  While they generally get along well, they both get aggravated and yell at each other from time to time.  Pt is currently receiving outpatient psychiatry from [facility], and has been prescribed Depakote 500 mg daily since 07/2011.  He has not had a valproic acid level taken recently.  He would like to see an outpatient therapist, but has not seen one since [facility] last year.  He has been admitted to [facility] on three occasions, in 2004, 2008, and 2013, and was also admitted to [facility] and to [facility] about 4 years ago.  He is requesting to be admitted to [facility] today.   Axis I: Bipolar Disorder NOS 296.80 Axis II: Deferred 799.9 Axis III:  Past Medical History  Diagnosis Date  . Bipolar 1 disorder    Axis IV: economic problems, occupational problems, problems with access to health care services and problems with primary support group Axis V: GAF = 35  Past Medical History:  Past Medical History  Diagnosis Date  . Bipolar 1 disorder     Past Surgical History  Procedure Laterality Date  . Back surgery      Family History: No family history on file.  Social History:  reports that  he has been smoking Cigarettes.  He has a 2.5 pack-year smoking history. He has never used smokeless tobacco. He reports that  drinks alcohol. He reports that he does not use illicit drugs.  Additional Social History:  Alcohol / Drug Use Pain Medications: Denies Prescriptions: Denies Over the Counter: Denies  CIWA:   COWS:    Allergies: No Known Allergies  Home Medications:  Medications Prior to Admission  Medication Sig Dispense Refill  . albuterol (PROVENTIL HFA;VENTOLIN HFA) 108 (90 BASE) MCG/ACT inhaler Inhale 2 puffs into the lungs every 6 (six) hours as needed. For shortness of breath      . divalproex (DEPAKOTE) 500 MG DR tablet  Take 500 mg by mouth daily. Started in 07/2012.  Reports no recent valproic acid level taken.      Marland Kitchen escitalopram (LEXAPRO) 10 MG tablet Take 1 tablet (10 mg total) by mouth daily.  30 tablet  1  . risperiDONE (RISPERDAL) 2 MG tablet Take 2 mg by mouth daily.      . vitamin C (ASCORBIC ACID) 500 MG tablet Take 500 mg by mouth daily.        OB/GYN Status:  No LMP for male patient.  General Assessment Data Location of Assessment: [facility] Assessment Services Living Arrangements: Parent (Father) Can pt return to current living arrangement?: Yes Admission Status: Voluntary Is patient capable of signing voluntary admission?: Yes Transfer from: Home Referral Source: Self/Family/Friend  Education Status Is patient currently in school?: No  Risk to self Suicidal Ideation: Yes-Currently Present Suicidal Intent: No Is patient at risk for suicide?: Yes Suicidal Plan?: Yes-Currently Present Specify Current Suicidal Plan: Hanging, overdose Access to Means: Yes Specify Access to Suicidal Means: Cords, medications What has been your use of drugs/alcohol within the last 12 months?: Denies Previous Attempts/Gestures: No How many times?: 0 Other Self Harm Risks: Limited outpatient support; social isolation including recent alienation from mother and break-up with girlfriend of 5 years. Triggers for Past Attempts: None known (Not applicable) Intentional Self Injurious Behavior: None Family Suicide History: No ("All of them are nuts."  Denies suicide attempts.) Recent stressful life event(s): Loss (Comment);Other (Comment);Financial Problems (Occupational/financial; estrangement from mother; isolation) Persecutory voices/beliefs?: No Depression: Yes Depression Symptoms: Fatigue;Guilt;Loss of interest in usual pleasures;Feeling worthless/self pity;Feeling angry/irritable Substance abuse history and/or treatment for substance abuse?: No Suicide prevention information given to non-admitted patients:  Yes  Risk to Others Homicidal Ideation: No Thoughts of Harm to Others: No Current Homicidal Intent: No Current Homicidal Plan: No Access to Homicidal Means: No Identified Victim: None History of harm to others?: No Assessment of Violence: None Noted Violent Behavior Description: Calm/cooperative Does patient have access to weapons?: No (Denies having access to firearms.) Criminal Charges Pending?: No Does patient have a court date: No  Psychosis Hallucinations: None noted Delusions: None noted  Mental Status Report Appear/Hygiene: Other (Comment) (Casual) Eye Contact: Poor Motor Activity: Restlessness (Fidgeting with hat.) Speech: Other (Comment) (Normal rate/rhythm/volume; flat prosody.) Level of Consciousness: Alert Mood: Depressed;Other (Comment) (Aloof) Affect: Other (Comment) (Constricted) Anxiety Level: Severe (Increased to severe level over past year; none currently) Thought Processes: Coherent;Relevant Judgement: Unimpaired Orientation: Person;Place;Time;Situation Obsessive Compulsive Thoughts/Behaviors: None  Cognitive Functioning Concentration: Decreased Memory: Recent Intact;Remote Intact IQ: Average Insight: Fair Impulse Control: Fair Appetite: Good Weight Loss: 0 Weight Gain: 0 Sleep: No Change Total Hours of Sleep: 7 (6 - 7 hrs/night) Vegetative Symptoms: None  ADLScreening [facility] Assessment Services) Patient's cognitive ability adequate to safely complete daily activities?: Yes Patient able to express need  for assistance with ADLs?: Yes Independently performs ADLs?: Yes (appropriate for developmental age)  Abuse/Neglect [facility]) Physical Abuse: Denies Verbal Abuse: Denies Sexual Abuse: Denies  Prior Inpatient Therapy Prior Inpatient Therapy: Yes Prior Therapy Dates: 08/2011; 07/2006; 05/2003: [facility] for SI Prior Therapy Facilty/Provider(s): 4 years ago: [facility] Reason for Treatment: 4 years ago: [facility]  Prior Outpatient Therapy Prior  Outpatient Therapy: Yes Prior Therapy Dates: Past year [facility] for psychiatry Prior Therapy Facilty/Provider(s): >1 year ago: [facility] Reason for Treatment: NA  ADL Screening (condition at time of admission) Patient's cognitive ability adequate to safely complete daily activities?: Yes Patient able to express need for assistance with ADLs?: Yes Independently performs ADLs?: Yes (appropriate for developmental age) Weakness of Legs: None Weakness of Arms/Hands: None  Home Assistive Devices/Equipment Home Assistive Devices/Equipment: None    Abuse/Neglect Assessment (Assessment to be complete while patient is alone) Physical Abuse: Denies Verbal Abuse: Denies Sexual Abuse: Denies Exploitation of patient/patient's resources: Denies Self-Neglect: Denies     Merchant navy officer (For Healthcare) Advance Directive: Patient does not have advance directive;Patient would not like information Pre-existing out of facility DNR order (yellow form or pink MOST form): No Nutrition Screen- [facility] Adult/WL/AP Have you recently lost weight without trying?: No Have you been eating poorly because of a decreased appetite?: No Malnutrition Screening Tool Score: 0  Additional Information 1:1 In Past 12 Months?: No CIRT Risk: No Elopement Risk: No Does patient have medical clearance?: No     Disposition:  Disposition Initial Assessment Completed for this Encounter: Yes Disposition of Patient: Inpatient treatment program Type of inpatient treatment program: Adult Reviewed pt with Serena Colonel, NP.  She believes pt presents a current danger to himself, and that he would benefit from admission to [facility].  Pt accepted to the service of Geoffery Lyons, MD, Rm 305-1.  Pt signed Voluntary Admission and Consent for Treatment.  Because he is being admitted, no MSE is required at this time.  On Site Evaluation by:   Reviewed with Physician:  Serena Colonel, NP @ 11:11  Doylene Canning, MA Assessment  Counselor Tayte, Mcwherter 11/01/2012 11:41 AM

## 2012-11-01 NOTE — H&P (Signed)
Psychiatric Admission Assessment Adult  Patient Identification:  [name]  Date of Evaluation:  11/01/2012  Chief Complaint:  BIPOLAR D/O,NOS  History of Present Illness: This is a 32 year old Caucasian male. Admitted to [facility] as a walk-in with complaints of increased anxiety levels. Patient reports, "I have bipolar disorder. I was diagnosed at age 58. Over the years, I have tried a lot of medicines. but I take Depakote 500 mg twice daily until about a week and half ago. And I don't think that it is working. I have called my outpatient provider, but they can't get me any appointment. I got frustrated and stopped my medicines about a week and half ago. Now my mind is all over the place. My thoughts are constantly going and going. I did not know what to do than to come in for help. I am not suicidal. I used to use drugs, but no more. I need something to calm my nerves".  Elements:  Location:  [facility] adult unit. Quality:  Increased anxiety, agitation, reslessness. Severity:  Rated anxiety at #4. Timing:  Started over 2 weeks ago. Duration:  I have had bipolar disorder since 16. Context:  Distractibility, anxiety, fatigue, poor concentration.  Associated Signs/Synptoms:  Depression Symptoms:  psychomotor agitation, difficulty concentrating, anxiety, loss of energy/fatigue,  (Hypo) Manic Symptoms:  Distractibility, Elevated Mood, Impulsivity, Labiality of Mood,  Anxiety Symptoms:  Excessive Worry, Panic Symptoms,  Psychotic Symptoms:  Hallucinations: Denies  PTSD Symptoms: Had a traumatic exposure:  Denies  Psychiatric Specialty Exam: Physical Exam  Constitutional: He is oriented to person, place, and time. He appears well-developed and well-nourished.  HENT:  Head: Normocephalic.  Eyes: Pupils are equal, round, and reactive to light.  Neck: Normal range of motion.  Cardiovascular: Normal rate.   Respiratory: Effort normal.  GI: Soft.  Musculoskeletal: Normal range of  motion.  Neurological: He is alert and oriented to person, place, and time.  Skin: Skin is warm.  Psychiatric: His speech is normal and behavior is normal. Judgment and thought content normal. His mood appears anxious (Rated #4). Cognition and memory are normal.    Review of Systems  Constitutional: Negative.   HENT: Negative.   Eyes: Negative.   Respiratory: Negative.   Cardiovascular: Negative.   Gastrointestinal: Negative.   Genitourinary: Negative.   Musculoskeletal: Negative.   Skin: Negative.   Neurological: Negative.   Endo/Heme/Allergies: Negative.   Psychiatric/Behavioral: Positive for substance abuse (Hx of). Negative for depression, suicidal ideas, hallucinations and memory loss. The patient is nervous/anxious (Rated #4). The patient does not have insomnia.     Blood pressure 116/78, pulse 96, temperature 97.8 F (36.6 C), temperature source Oral, height 5\' 9"  (1.753 m), weight 77.111 kg (170 lb).Body mass index is 25.09 kg/(m^2).  General Appearance: Casual  Eye Contact::  Good  Speech:  Clear and Coherent  Volume:  Normal  Mood:  Anxious  Affect:  Congruent  Thought Process:  Coherent and Intact  Orientation:  Full (Time, Place, and Person)  Thought Content:  Rumination  Suicidal Thoughts:  No  Homicidal Thoughts:  No  Memory:  Immediate;   Good Recent;   Good Remote;   Good  Judgement:  Fair  Insight:  Fair  Psychomotor Activity:  Restlessness  Concentration:  Fair  Recall:  Good  Akathisia:  No  Handed:  Right  AIMS (if indicated):     Assets:  Desire for Improvement  Sleep:       Past Psychiatric History: Diagnosis:  Bipolar affective disorder  Hospitalizations: [facility]  Outpatient Care: [facility]  Substance Abuse Care: None reported  Self-Mutilation: Denies  Suicidal Attempts: Denies attempts and or thoughts  Violent Behaviors: None reported   Past Medical History:   Past Medical History  Diagnosis Date  . Bipolar 1 disorder     None.  Allergies:  No Known Allergies  PTA Medications: Prescriptions prior to admission  Medication Sig Dispense Refill  . albuterol (PROVENTIL HFA;VENTOLIN HFA) 108 (90 BASE) MCG/ACT inhaler Inhale 2 puffs into the lungs every 6 (six) hours as needed. For shortness of breath      . divalproex (DEPAKOTE) 500 MG DR tablet Take 500 mg by mouth daily. Started in 07/2012.  Reports no recent valproic acid level taken.      Marland Kitchen escitalopram (LEXAPRO) 10 MG tablet Take 1 tablet (10 mg total) by mouth daily.  30 tablet  1  . risperiDONE (RISPERDAL) 2 MG tablet Take 2 mg by mouth daily.      . vitamin C (ASCORBIC ACID) 500 MG tablet Take 500 mg by mouth daily.        Previous Psychotropic Medications:  Medication/Dose  See medication lists               Substance Abuse History in the last 12 months:  "No"  Consequences of Substance Abuse: Medical Consequences:  Liver damage, Possible death by overdose Legal Consequences:  Arrests, jail time, Loss of driving privilege. Family Consequences:  Family discord, divorce and or separation.  Social History:  reports that he has been smoking Cigarettes.  He has a 2.5 pack-year smoking history. He has never used smokeless tobacco. He reports that  drinks alcohol. He reports that he does not use illicit drugs. Additional Social History: Pain Medications: none History of alcohol / drug use?: Yes Negative Consequences of Use: Financial;Personal relationships  Current Place of Residence: [place], [place]    Place of Birth: [place], [place]   Family Members: None reported  Marital Status:  Single  Children: 0  Sons:  Daughters:  Relationships: Single  Education:  [employer] Financial planner Problems/Performance: Completed high school  Religious Beliefs/Practices: NA  History of Abuse (Emotional/Phsycial/Sexual): Denies  Occupational Experiences: Employed  Hotel manager History:  None.  Legal History: None reported  Hobbies/Interests:  None reporte  Family History:  No family history on file.  No results found for this or any previous visit (from the past 72 hour(s)). Psychological Evaluations:  Assessment:   AXIS I:  Bipolar affective disorder, GAD AXIS II:  Deferred AXIS III:   Past Medical History  Diagnosis Date  . Bipolar 1 disorder    AXIS IV:  other psychosocial or environmental problems AXIS V:  41-50 serious symptoms  Treatment Plan/Recommendations: 1. Admit for crisis management and stabilization, estimated length of stay 3-5 days.  2. Medication management to reduce current symptoms to base line and improve the patient's overall level of functioning (a). Depakote ER 500 mg                                (b). Risperdal 1 mg Q bedtime, 0.5 mg bid for anxiety.                                (c). Obtain Tegretol levels, and other pertinent labs.                                (  d). Neurontin 100 mg tid        3. Treat health problems as indicated.  4. Develop treatment plan to decrease risk of relapse upon discharge and the need for readmission.  5. Psycho-social education regarding relapse prevention and self care.  6. Health care follow up as needed for medical problems.  7. Review, reconcile, and reinstate any pertinent home medications for other health issues where appropriate. 8. Call for consults with hospitalist for any additional specialty patient care services as needed.  Treatment Plan Summary: Daily contact with patient to assess and evaluate symptoms and progress in treatment Medication management  Current Medications:  Current Facility-Administered Medications  Medication Dose Route Frequency Provider Last Rate Last Dose  . albuterol (PROVENTIL HFA;VENTOLIN HFA) 108 (90 BASE) MCG/ACT inhaler 2 puff  2 puff Inhalation Q6H PRN Sanjuana Kava, NP      . divalproex (DEPAKOTE) DR tablet 500 mg  500 mg Oral Daily Sanjuana Kava, NP      . escitalopram (LEXAPRO) tablet 10 mg  10 mg Oral Daily Sanjuana Kava, NP      . risperiDONE (RISPERDAL) tablet 2 mg  2 mg Oral Daily Sanjuana Kava, NP      . vitamin C (ASCORBIC ACID) tablet 500 mg  500 mg Oral Daily Sanjuana Kava, NP        Observation Level/Precautions:  15 minute checks  Laboratory:  Obtain CBC with diff, CMP, UDS, Toxicology tests, U/A, TSH  Psychotherapy:  Group sessions  Medications:  See medication lists  Consultations:  As needed  Discharge Concerns:  Safety/sobriety  Estimated LOS: 3-5 days  Other:     I certify that inpatient services furnished can reasonably be expected to improve the patient's condition.   Sanjuana Kava, PMH-Np 5/31/20142:24 PM  I have examined the patient an agreed with the findings of H&P

## 2012-11-01 NOTE — Progress Notes (Signed)
Pt is a 32 year old male admitted with depression and substance abuse   He admitted to being suicidal with no plan  He said his depakote wasn't working for his bipolar so he relapsed on cocaine   He denies any other drug use but has a history of polysubstance abuse  He has been hospitalized here before  He was cooperative and pleasant during the assessment and seemed to have some insight   Verbal support given  Pt oriented to the unit and given nourishment  Orders received   Q 15 min checks  Pt is adjusting well

## 2012-11-01 NOTE — Progress Notes (Signed)
Psychoeducational Group Note  Date:  11/01/2012 Time:  0945 am  Group Topic/Focus:  Identifying Needs:   The focus of this group is to help patients identify their personal needs that have been historically problematic and identify healthy behaviors to address their needs.  Participation Level:  Minimal  Participation Quality:  Appropriate  Affect:  Appropriate  Cognitive:  Appropriate  Insight:  Improving  Engagement in Group:  Improving  Additional Comments:    Andrena Mews 11/01/2012,3:18 PM

## 2012-11-01 NOTE — [facility] Suicide Risk Assessment (Signed)
Suicide Risk Assessment  Admission Assessment     Nursing information obtained from:    Demographic factors:    Current Mental Status:    Loss Factors:    Historical Factors:    Risk Reduction Factors:     CLINICAL FACTORS:   Bipolar Disorder:   Mixed State Unstable or Poor Therapeutic Relationship Previous Psychiatric Diagnoses and Treatments  COGNITIVE FEATURES THAT CONTRIBUTE TO RISK:  Polarized thinking    SUICIDE RISK:   Minimal: No identifiable suicidal ideation.  Patients presenting with no risk factors but with morbid ruminations; may be classified as minimal risk based on the severity of the depressive symptoms  PLAN OF CARE:  I certify that inpatient services furnished can reasonably be expected to improve the patient's condition.  [name] T. 11/01/2012, 3:41 PM

## 2012-11-01 NOTE — Progress Notes (Signed)
Please note: this column is intended for the 10/14/2012 encounter, and was erroneously entered under the 08/06/2011 encounter.

## 2012-11-01 NOTE — Tx Team (Signed)
Initial Interdisciplinary Treatment Plan  PATIENT STRENGTHS: (choose at least two) Ability for insight Active sense of humor Average or above average intelligence  PATIENT STRESSORS: Financial difficulties Marital or family conflict Medication change or noncompliance   PROBLEM LIST: Problem List/Patient Goals Date to be addressed Date deferred Reason deferred Estimated date of resolution  Depression       Poly substance abuse                                                 DISCHARGE CRITERIA:  Adequate post-discharge living arrangements Improved stabilization in mood, thinking, and/or behavior Verbal commitment to aftercare and medication compliance  PRELIMINARY DISCHARGE PLAN: Attend aftercare/continuing care group Attend 12-step recovery group  PATIENT/FAMIILY INVOLVEMENT: This treatment plan has been presented to and reviewed with the patient, DOIL KAMARA, and/or family member, .  The patient and family have been given the opportunity to ask questions and make suggestions.  Andrena Mews 11/01/2012, 1:06 PM

## 2012-11-01 NOTE — BH Assessment (Signed)
Assessment Note   [name] is a 32 y.o. single white male.  He goes by "[name]."  He presents to [facility] unaccompanied complaining of SI.  Pt reports several recent stressors, mostly related to relocating from [place] to [place] in 09/2012 after having moved there with his girlfriend in 01/2012.  Their relationship had persisted for 5 years, but they broke up around the same time that pt moved back to [place].  Pt attributes the break-up to excessive partying on his part.  Pt decided to return to [place] and move in with his father to be a support for him after his mother left the father for another man.  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  As a result, pt states, "I'm just tired of everything."  Pt reports that he has had SI "since I was 32 years old."  Recently he has had a plan to hang himself or overdose "and just not wake up."  He has never made a suicide attempt or engaged in self-mutilation.  When asked what prompted him to seek treatment rather than hurt himself today, pt states, "I know that things are going to get better.  There's a light at the end of the tunnel, I just can't see it yet."  Pt endorses depressed mood with symptoms noted in the "risk to self" assessment below.  He also reports problems with anxiety, increasing at times to near-panic level.  Pt denies HI or physical aggression, and is cooperative and calm, other than fidgeting with his cap.  Pt denies any problems with hallucinations, and he exhibits no signs of delusional thought or internal stimuli; his reality testing appears to be intact.  Pt denies any current substance abuse problems, but later divulged that he had used a gram of  cocaine last night and the night before.  He does not appear to be intoxicated or in withdrawal at this time.  Pt identifies his father as his only social support.  While they generally get along well, they both get aggravated and yell at each other from time to time.  Pt is currently receiving outpatient psychiatry from [facility], and has been prescribed Depakote 500 mg daily since 07/2011.  He has not had a valproic acid level taken recently.  He would like to see an outpatient therapist, but has not seen one since [name] last year.  He has been admitted to [facility] on three occasions, in 2004, 2008, and 2013, and was also admitted to [facility] and to [facility] about 4 years ago.  He is requesting to be admitted to [facility] today.   Axis I: Bipolar Disorder NOS 296.80 Axis II: Deferred 799.9 Axis III:  Past Medical History  Diagnosis Date  . Bipolar 1 disorder    Axis IV: economic problems, occupational problems, problems with access to health care services and problems with primary support group Axis V: GAF = 35  Past Medical History:  Past Medical History  Diagnosis Date  . Bipolar 1 disorder     Past Surgical History  Procedure Laterality Date  . Back surgery      Family History: No family history on file.  Social History:  reports that   he has been smoking Cigarettes.  He has a 2.5 pack-year smoking history. He has never used smokeless tobacco. He reports that  drinks alcohol. He reports that he does not use illicit drugs.  Additional Social History:  Alcohol / Drug Use Pain Medications: Denies Prescriptions: Denies Over the Counter: Denies  CIWA:   COWS:    Allergies: No Known Allergies  Home Medications:  Medications Prior to Admission  Medication Sig Dispense Refill  . albuterol (PROVENTIL HFA;VENTOLIN HFA) 108 (90 BASE) MCG/ACT inhaler Inhale 2 puffs into the lungs every 6 (six) hours as needed. For shortness of breath      . divalproex (DEPAKOTE) 500 MG DR tablet  Take 500 mg by mouth daily. Started in 07/2012.  Reports no recent valproic acid level taken.      . escitalopram (LEXAPRO) 10 MG tablet Take 1 tablet (10 mg total) by mouth daily.  30 tablet  1  . risperiDONE (RISPERDAL) 2 MG tablet Take 2 mg by mouth daily.      . vitamin C (ASCORBIC ACID) 500 MG tablet Take 500 mg by mouth daily.        OB/GYN Status:  No LMP for male patient.  General Assessment Data Location of Assessment: [facility] Services Living Arrangements: Parent (Father) Can pt return to current living arrangement?: Yes Admission Status: Voluntary Is patient capable of signing voluntary admission?: Yes Transfer from: Home Referral Source: Self/Family/Friend  Education Status Is patient currently in school?: No  Risk to self Suicidal Ideation: Yes-Currently Present Suicidal Intent: No Is patient at risk for suicide?: Yes Suicidal Plan?: Yes-Currently Present Specify Current Suicidal Plan: Hanging, overdose Access to Means: Yes Specify Access to Suicidal Means: Cords, medications What has been your use of drugs/alcohol within the last 12 months?: Denies Previous Attempts/Gestures: No How many times?: 0 Other Self Harm Risks: Limited outpatient support; social isolation including recent alienation from mother and break-up with girlfriend of 5 years. Triggers for Past Attempts: None known (Not applicable) Intentional Self Injurious Behavior: None Family Suicide History: No ("All of them are nuts."  Denies suicide attempts.) Recent stressful life event(s): Loss (Comment);Other (Comment);Financial Problems (Occupational/financial; estrangement from mother; isolation) Persecutory voices/beliefs?: No Depression: Yes Depression Symptoms: Fatigue;Guilt;Loss of interest in usual pleasures;Feeling worthless/self pity;Feeling angry/irritable Substance abuse history and/or treatment for substance abuse?: No Suicide prevention information given to non-admitted patients:  Yes  Risk to Others Homicidal Ideation: No Thoughts of Harm to Others: No Current Homicidal Intent: No Current Homicidal Plan: No Access to Homicidal Means: No Identified Victim: None History of harm to others?: No Assessment of Violence: None Noted Violent Behavior Description: Calm/cooperative Does patient have access to weapons?: No (Denies having access to firearms.) Criminal Charges Pending?: No Does patient have a court date: No  Psychosis Hallucinations: None noted Delusions: None noted  Mental Status Report Appear/Hygiene: Other (Comment) (Casual) Eye Contact: Poor Motor Activity: Restlessness (Fidgeting with hat.) Speech: Other (Comment) (Normal rate/rhythm/volume; flat prosody.) Level of Consciousness: Alert Mood: Depressed;Other (Comment) (Aloof) Affect: Other (Comment) (Constricted) Anxiety Level: Severe (Increased to severe level over past year; none currently) Thought Processes: Coherent;Relevant Judgement: Unimpaired Orientation: Person;Place;Time;Situation Obsessive Compulsive Thoughts/Behaviors: None  Cognitive Functioning Concentration: Decreased Memory: Recent Intact;Remote Intact IQ: Average Insight: Fair Impulse Control: Fair Appetite: Good Weight Loss: 0 Weight Gain: 0 Sleep: No Change Total Hours of Sleep: 7 (6 - 7 hrs/night) Vegetative Symptoms: None  ADLScreening ([facility] Assessment Services) Patient's cognitive ability adequate to safely complete daily activities?: Yes Patient able to express need   for assistance with ADLs?: Yes Independently performs ADLs?: Yes (appropriate for developmental age)  Abuse/Neglect ([facility]) Physical Abuse: Denies Verbal Abuse: Denies Sexual Abuse: Denies  Prior Inpatient Therapy Prior Inpatient Therapy: Yes Prior Therapy Dates: 08/2011; 07/2006; 05/2003: [facility] for SI Prior Therapy Facilty/Provider(s): 4 years ago: [facility] Reason for Treatment: 4 years ago: [facility]  Prior Outpatient Therapy Prior  Outpatient Therapy: Yes Prior Therapy Dates: Past year [facility] for psychiatry Prior Therapy Facilty/Provider(s): >1 year ago: [name] Reason for Treatment: NA  ADL Screening (condition at time of admission) Patient's cognitive ability adequate to safely complete daily activities?: Yes Patient able to express need for assistance with ADLs?: Yes Independently performs ADLs?: Yes (appropriate for developmental age) Weakness of Legs: None Weakness of Arms/Hands: None  Home Assistive Devices/Equipment Home Assistive Devices/Equipment: None    Abuse/Neglect Assessment (Assessment to be complete while patient is alone) Physical Abuse: Denies Verbal Abuse: Denies Sexual Abuse: Denies Exploitation of patient/patient's resources: Denies Self-Neglect: Denies     Advance Directives (For Healthcare) Advance Directive: Patient does not have advance directive;Patient would not like information Pre-existing out of facility DNR order (yellow form or pink MOST form): No Nutrition Screen- [facility] Adult/WL/AP Have you recently lost weight without trying?: No Have you been eating poorly because of a decreased appetite?: No Malnutrition Screening Tool Score: 0  Additional Information 1:1 In Past 12 Months?: No CIRT Risk: No Elopement Risk: No Does patient have medical clearance?: No     Disposition:  Disposition Initial Assessment Completed for this Encounter: Yes Disposition of Patient: Inpatient treatment program Type of inpatient treatment program: Adult Reviewed pt with Aggie Nwoko, NP.  She believes pt presents a current danger to himself, and that he would benefit from admission to [facility].  Pt accepted to the service of Irving Lugo, MD, Rm 305-1.  Pt signed Voluntary Admission and Consent for Treatment.  Because he is being admitted, no MSE is required at this time.  On Site Evaluation by:   Reviewed with Physician:  Aggie Nwoko, NP @ 11:11  Karle Desrosier, MA Assessment  Counselor Gianno Volner Ruby 11/01/2012 11:41 AM 

## 2012-11-01 NOTE — [facility] Group Notes (Signed)
[facility] Group Notes: (Clinical Social Work)   11/01/2012      Type of Therapy:  Group Therapy   Participation Level:  Did Not Attend    Ambrose Mantle, LCSW 11/01/2012, 12:57 PM

## 2012-11-02 DIAGNOSIS — F319 Bipolar disorder, unspecified: Secondary | ICD-10-CM

## 2012-11-02 LAB — COMPREHENSIVE METABOLIC PANEL
AST: 13 U/L (ref 0–37)
CO2: 30 mEq/L (ref 19–32)
Calcium: 9.4 mg/dL (ref 8.4–10.5)
Creatinine, Ser: 1.22 mg/dL (ref 0.50–1.35)
GFR calc Af Amer: >90 mL/min (ref >90)
GFR calc non Af Amer: 78 mL/min — ABNORMAL LOW (ref >90)
Glucose, Bld: 88 mg/dL (ref 70–99)
Sodium: 140 mEq/L (ref 135–145)
Total Protein: 7 g/dL (ref 6.0–8.3)

## 2012-11-02 NOTE — Progress Notes (Signed)
Psychoeducational Group Note  Date:  11/02/2012 Time:  0945 am  Group Topic/Focus:  Making Healthy Choices:   The focus of this group is to help patients identify negative/unhealthy choices they were using prior to admission and identify positive/healthier coping strategies to replace them upon discharge.  Participation Level:  Active  Participation Quality:  Appropriate and Attentive  Affect:  Appropriate  Cognitive:  Alert  Insight:  Developing/Improving  Engagement in Group:  Developing/Improving  Additional Comments:    Andrena Mews 11/02/2012, 10:29 AM

## 2012-11-02 NOTE — Progress Notes (Signed)
Patient did attend the evening speaker AA meeting.  

## 2012-11-02 NOTE — [facility] Group Notes (Signed)
[facility] Group Notes: (Clinical Social Work)   11/02/2012      Type of Therapy:  Group Therapy   Participation Level:  Did Not Attend    Ambrose Mantle, LCSW 11/02/2012, 1:28 PM

## 2012-11-02 NOTE — Progress Notes (Signed)
Met with pt 1:1 who denies needs at this time. Minimal information forwarded. Pt denied need for trazadone prn stating, "I just took a ton of meds. I'm not having any withdrawal or anything." Pt somewhat restless. Anxious affect with congruent mood. Supported, encouraged. Denies SI/HI/AVH and remains safe. Lawrence Marseilles

## 2012-11-02 NOTE — Progress Notes (Signed)
D   Pt has been appropriate and pleasant  He has attended groups and is compliant with treatment  He appears to be gaining insight into his addiction   He does say the medications are making him drowsy but he also realizes when his body gets used to it he will not be as sleepy A   Verbal support given   Medications administered and effectiveness monitored   Q 15 min checks R   Pt safe at present

## 2012-11-02 NOTE — Progress Notes (Signed)
[facility] Group Notes:  (Nursing/MHT/Case Management/Adjunct)  Date:  11/01/2012   Time:  2100  Type of Therapy:  wrap up group  Participation Level:  Active  Participation Quality:  Appropriate, Attentive and Sharing  Affect:  Appropriate  Cognitive:  Alert and Appropriate  Insight:  Appropriate  Engagement in Group:  Engaged  Modes of Intervention:  Clarification, Education and Support  Summary of Progress/Problems: Pt reports being here is a positive thing for him. He has relocated from [place] and moved in with his father while his father goes through a divorce.  Pt reports having supportive family and friends and plans to discharge back to his father's house.  This is not pt's first time in treatment.   Shelah Lewandowsky 11/02/2012, 4:11 AM

## 2012-11-02 NOTE — Progress Notes (Signed)
Psychoeducational Group Note  Date:  11/02/2012 Time:  0945 am  Group Topic/Focus:  Making Healthy Choices:   The focus of this group is to help patients identify negative/unhealthy choices they were using prior to admission and identify positive/healthier coping strategies to replace them upon discharge.  Participation Level:  Did Not Attend  Kerrick Miler J 11/02/2012, 10:29 AM 

## 2012-11-02 NOTE — Progress Notes (Addendum)
[facility] MD Progress Note  11/02/2012 3:33 PM [name]  MRN:  [id number]  Subjective:  "Feeling a little tired today. The fatigue may be coming from my medicines. However, I like the combination that I'm on right now. I can tell the difference from when I came in here. I would like to stay on this combination till my mother gets adjusted to them again". Denies any SIHI, AVH.   Diagnosis:   Axis I: Bipolar affective disorder Axis II: Deferred Axis III:  Past Medical History  Diagnosis Date  . Bipolar 1 disorder    Axis IV: other psychosocial or environmental problems Axis V: 51-60 moderate symptoms  ADL's:  Intact  Sleep: Fair  Appetite:  Good  Suicidal Ideation:  Plan:  Denies Intent:  Denies Means:  Denies Homicidal Ideation:  Plan:  DEnies Intent:  Denies Means:  Denies  AEB (as evidenced by): Per patient's reports  Psychiatric Specialty Exam: Review of Systems  Constitutional: Negative.   HENT: Negative.   Eyes: Negative.   Respiratory: Negative.   Cardiovascular: Negative.   Gastrointestinal: Negative.   Genitourinary: Negative.   Musculoskeletal: Negative.   Skin: Negative.   Neurological: Negative.   Endo/Heme/Allergies: Negative.   Psychiatric/Behavioral: Positive for depression (Currently being stabilized with medication) and substance abuse (Hx alcoholism). Negative for suicidal ideas, hallucinations and memory loss. The patient is nervous/anxious (Currently being stabilized with medication) and has insomnia (Currently being stabilized with medication).     Blood pressure 127/76, pulse 86, temperature 97.6 F (36.4 C), temperature source Oral, resp. rate 16, height 5\' 9"  (1.753 m), weight 77.111 kg (170 lb).Body mass index is 25.09 kg/(m^2).  General Appearance: Fairly Groomed  Patent attorney::  Good  Speech:  Clear and Coherent  Volume:  Normal  Mood:  "Feeling better"  Affect:  Appropriate and Congruent  Thought Process:  Coherent, Goal Directed,  Intact and Logical  Orientation:  Full (Time, Place, and Person)  Thought Content:  Rumination  Suicidal Thoughts:  No  Homicidal Thoughts:  No  Memory:  Immediate;   Good Recent;   Good Remote;   Good  Judgement:  Good  Insight:  Good  Psychomotor Activity:  "Tired"  Concentration:  Good  Recall:  Good  Akathisia:  No  Handed:  Right  AIMS (if indicated):     Assets:  Desire for Improvement  Sleep:  Number of Hours: 4   Current Medications: Current Facility-Administered Medications  Medication Dose Route Frequency Provider Last Rate Last Dose  . acetaminophen (TYLENOL) tablet 650 mg  650 mg Oral Q6H PRN Sanjuana Kava, NP      . albuterol (PROVENTIL HFA;VENTOLIN HFA) 108 (90 BASE) MCG/ACT inhaler 2 puff  2 puff Inhalation Q6H PRN Sanjuana Kava, NP      . alum & mag hydroxide-simeth (MAALOX/MYLANTA) 200-200-20 MG/5ML suspension 30 mL  30 mL Oral Q4H PRN Sanjuana Kava, NP      . divalproex (DEPAKOTE) DR tablet 500 mg  500 mg Oral Daily Sanjuana Kava, NP   500 mg at 11/02/12 0804  . escitalopram (LEXAPRO) tablet 10 mg  10 mg Oral Daily Sanjuana Kava, NP   10 mg at 11/02/12 0804  . gabapentin (NEURONTIN) capsule 100 mg  100 mg Oral TID Sanjuana Kava, NP   100 mg at 11/02/12 0804  . magnesium hydroxide (MILK OF MAGNESIA) suspension 30 mL  30 mL Oral Daily PRN Sanjuana Kava, NP      .  nicotine (NICODERM CQ - dosed in mg/24 hours) patch 21 mg  21 mg Transdermal Q0600 Sanjuana Kava, NP      . risperiDONE (RISPERDAL) tablet 0.5 mg  0.5 mg Oral BID Sanjuana Kava, NP   0.5 mg at 11/02/12 0807  . risperiDONE (RISPERDAL) tablet 1 mg  1 mg Oral QHS Sanjuana Kava, NP      . traZODone (DESYREL) tablet 50 mg  50 mg Oral QHS PRN Sanjuana Kava, NP      . vitamin C (ASCORBIC ACID) tablet 500 mg  500 mg Oral Daily Sanjuana Kava, NP   500 mg at 11/02/12 1308    Lab Results:  Results for orders placed during the hospital encounter of 11/01/12 (from the past 48 hour(s))  CBC WITH DIFFERENTIAL      Status: None   Collection Time    11/01/12  6:55 PM      Result Value Range   WBC 9.1  4.0 - 10.5 K/uL   RBC 4.86  4.22 - 5.81 MIL/uL   Hemoglobin 14.8  13.0 - 17.0 g/dL   HCT 65.7  84.6 - 96.2 %   MCV 92.6  78.0 - 100.0 fL   MCH 30.5  26.0 - 34.0 pg   MCHC 32.9  30.0 - 36.0 g/dL   RDW 95.2  84.1 - 32.4 %   Platelets 235  150 - 400 K/uL   Neutrophils Relative % 56  43 - 77 %   Neutro Abs 5.1  1.7 - 7.7 K/uL   Lymphocytes Relative 33  12 - 46 %   Lymphs Abs 3.0  0.7 - 4.0 K/uL   Monocytes Relative 9  3 - 12 %   Monocytes Absolute 0.8  0.1 - 1.0 K/uL   Eosinophils Relative 2  0 - 5 %   Eosinophils Absolute 0.2  0.0 - 0.7 K/uL   Basophils Relative 0  0 - 1 %   Basophils Absolute 0.0  0.0 - 0.1 K/uL  COMPREHENSIVE METABOLIC PANEL     Status: Abnormal   Collection Time    11/02/12  6:49 AM      Result Value Range   Sodium 140  135 - 145 mEq/L   Potassium 4.3  3.5 - 5.1 mEq/L   Chloride 103  96 - 112 mEq/L   CO2 30  19 - 32 mEq/L   Glucose, Bld 88  70 - 99 mg/dL   BUN 12  6 - 23 mg/dL   Creatinine, Ser 4.01  0.50 - 1.35 mg/dL   Calcium 9.4  8.4 - 02.7 mg/dL   Total Protein 7.0  6.0 - 8.3 g/dL   Albumin 3.8  3.5 - 5.2 g/dL   AST 13  0 - 37 U/L   ALT 19  0 - 53 U/L   Alkaline Phosphatase 73  39 - 117 U/L   Total Bilirubin 0.7  0.3 - 1.2 mg/dL   GFR calc non Af Amer 78 (*) >90 mL/min   GFR calc Af Amer >90  >90 mL/min   Comment:            The eGFR has been calculated     using the CKD EPI equation.     This calculation has not been     validated in all clinical     situations.     eGFR's persistently     <90 mL/min signify     possible Chronic Kidney Disease.  Physical Findings: AIMS: Facial and Oral Movements Muscles of Facial Expression: None, normal Lips and Perioral Area: None, normal Jaw: None, normal Tongue: None, normal,Extremity Movements Upper (arms, wrists, hands, fingers): None, normal Lower (legs, knees, ankles, toes): None, normal, Trunk  Movements Neck, shoulders, hips: None, normal, Overall Severity Severity of abnormal movements (highest score from questions above): None, normal Incapacitation due to abnormal movements: None, normal Patient's awareness of abnormal movements (rate only patient's report): No Awareness, Dental Status Current problems with teeth and/or dentures?: No Does patient usually wear dentures?: No  CIWA:  CIWA-Ar Total: 0 COWS:     Treatment Plan Summary: Daily contact with patient to assess and evaluate symptoms and progress in treatment Medication management  Plan: Supportive approach/coping skills/relapse prevention. Encouraged out of room, participation in group sessions and application of coping skills when distressed. Will continue to monitor response to/adverse effects of medications in use to assure effectiveness. Continue to monitor mood, behavior and interaction with staff and other patients. Continue current plan of care.  Medical Decision Making Problem Points:  Established problem, stable/improving (1), Review of last therapy session (1) and Review of psycho-social stressors (1) Data Points:  Review of medication regiment & side effects (2) Review of new medications or change in dosage (2)  I certify that inpatient services furnished can reasonably be expected to improve the patient's condition.   Armandina Stammer I, PMHNP-BC 11/02/2012, 3:33 PM I am agreed with the findings and involve in the treatment plan.

## 2012-11-02 NOTE — Progress Notes (Signed)
Adult Psychoeducational Group Note  Date:  11/02/2012 Time:  7:05 PM  Group Topic/Focus:  Goals Group:   The focus of this group is to help patients establish daily goals to achieve during treatment and discuss how the patient can incorporate goal setting into their daily lives to aide in recovery.  Participation Level:  Minimal  Participation Quality:  Appropriate  Affect:  Resistant  Cognitive:  Appropriate  Insight: Appropriate  Engagement in Group:  Lacking  Modes of Intervention:  Discussion and Education  Additional Comments:  Group read "Ending the cycle" from Sunday programming workbook out loud and then discussed. Group was then asked to form a goal, thinking about the "Ending the cycle" reading and what it meant to them.    Dalia Heading 11/02/2012, 7:05 PM

## 2012-11-03 ENCOUNTER — Encounter (HOSPITAL_COMMUNITY): Payer: Self-pay | Admitting: Psychiatry

## 2012-11-03 DIAGNOSIS — F3130 Bipolar disorder, current episode depressed, mild or moderate severity, unspecified: Principal | ICD-10-CM

## 2012-11-03 MED ORDER — DIVALPROEX SODIUM 500 MG PO DR TAB: 500.0000 mg | DELAYED_RELEASE_TABLET | Freq: Every day | ORAL | Status: DC

## 2012-11-03 MED ORDER — GABAPENTIN 100 MG PO CAPS: 100.0000 mg | ORAL_CAPSULE | Freq: Three times a day (TID) | ORAL | Status: DC

## 2012-11-03 MED ORDER — ESCITALOPRAM OXALATE 10 MG PO TABS: 10.0000 mg | ORAL_TABLET | Freq: Every day | ORAL | Status: DC

## 2012-11-03 MED ORDER — RISPERIDONE 0.5 MG PO TABS: ORAL_TABLET | ORAL | Status: DC

## 2012-11-03 NOTE — Progress Notes (Signed)
Pt up and around milieu though attended only half of his groups today. Does report some fatigue which he attributes to the initiation of his med regimen. No needs voiced. No physical complaints. Medicated per orders. Supported and encouraged. No SI/HI/AVh and remains safe. [name]

## 2012-11-03 NOTE — Progress Notes (Signed)
[facility] Adult Case Management Discharge Plan :  Will you be returning to the same living situation after discharge: Yes,  home with father At discharge, do you have transportation home?:Yes,  father Do you have the ability to pay for your medications:Yes  Release of information consent forms completed and in the chart;  Patient's signature needed at discharge.  Patient to Follow up at: Follow-up Information   Follow up with [facility]  On 11/05/2012. (Follow up at [facility] through walk in clinic either Wednesday or Thursday of this week between the hours of 8AM and 3PM)    Contact information:   [address]  [place] [address] [facility] [phone] Valinda Hoar [phone]      Patient denies SI/HI:   Yes,  denies both    Safety Planning and Suicide Prevention discussed:  Yes,  with patient as he refused to give consent for [place] to contact family  Clide Dales 11/03/2012, 6:28 PM

## 2012-11-03 NOTE — BHH Group Notes (Signed)
[facility] LCSW Aftercare Discharge Planning Group Note   11/03/2012  8:45 AM  Participation Quality:  Appropriate  Mood/Affect:  Appropriate  Depression Rating:  1  Anxiety Rating:  1  Thoughts of Suicide:  No Will you contract for safety?   NA  Current AVH:  No  Plan for Discharge/Comments:  Follow up at [facility] for medication Management  Transportation Means: Walk as lives within 1 mile, or father will pick up  Supports: Family and friends  Dyane Dustman, Julious Payer

## 2012-11-03 NOTE — Progress Notes (Signed)
Psychoeducational Group Note  Date:  11/03/2012 Time:  1100  Group Topic/Focus:  Wellness Toolbox:   The focus of this group is to discuss various aspects of wellness, balancing those aspects and exploring ways to increase the ability to experience wellness.  Patients will create a wellness toolbox for use upon discharge.  Participation Level: Did Not Attend  Participation Quality:  Not Applicable  Affect:  Not Applicable  Cognitive:  Not Applicable  Insight:  Not Applicable  Engagement in Group: Not Applicable  Additional Comments:  Pt did not attend group.   Sharyn Lull 11/03/2012, 12:08 PM

## 2012-11-03 NOTE — BHH Counselor (Signed)
Adult Comprehensive Assessment  Patient ID: CORKY BLUMSTEIN, male   DOB: 1981-05-29, 32 y.o.   MRN: [id number]  Information Source: Information source: Patient  Current Stressors:  Educational / Learning stressors: NA Employment / Job issues: NA Family Relationships: NA Surveyor, quantity / Lack of resources (include bankruptcy): NA Housing / Lack of housing: NA Physical health (include injuries & life threatening diseases): Off meds one week Social relationships: NA Substance abuse: NA Bereavement / Loss: Uncle and Aunt   Living/Environment/Situation:  Living Arrangements: Parent Living conditions (as described by patient or guardian): Lives with Dad since 09/02/12 How long has patient lived in current situation?: 2 months, reports he returned to [place] from [place] where he had lives for 10 months in order to be support to father as parents recently separated What is atmosphere in current home: Comfortable;Supportive  Family History:  Marital status: Single Does patient have children?: No  Childhood History:  By whom was/is the patient raised?: Both parents Additional childhood history information: "[place].  All my needs were met." Description of patient's relationship with caregiver when they were a child: Good with both Patient's description of current relationship with people who raised him/her: [place] with father, not so good with mother Does patient have siblings?: Yes Number of Siblings: 4 Description of patient's current relationship with siblings: Good with all Did patient suffer any verbal/emotional/physical/sexual abuse as a child?: No Did patient suffer from severe childhood neglect?: No Has patient ever been sexually abused/assaulted/raped as an adolescent or adult?: No Was the patient ever a victim of a crime or a disaster?: No Witnessed domestic violence?: No Has patient been effected by domestic violence as an adult?: No  Education:  Highest grade of school patient has  completed: 12 Currently a Consulting civil engineer?: No Learning disability?: No  Employment/Work Situation:   Employment situation: On disability Why is patient on disability: for Bipolar Disorder How long has patient been on disability: 16 years Patient's job has been impacted by current illness: No What is the longest time patient has a held a job?: 6 months  Where was the patient employed at that time?: Restaurant Has patient ever been in the [employer]?: No Has patient ever served in Buyer, retail?: No  Financial Resources:   Surveyor, quantity resources: Insurance claims handler Does patient have a Lawyer or guardian?: No  Alcohol/Substance Abuse:   What has been your use of drugs/alcohol within the last 12 months?: NA Alcohol/Substance Abuse Treatment Hx: Denies past history Has alcohol/substance abuse ever caused legal problems?: No  Social Support System:   Conservation officer, nature Support System: Production assistant, radio System: Family, friends, church, customers (does Radiation protection practitioner) Type of faith/religion: Church  How does patient's faith help to cope with current illness?: Attendance helps  Leisure/Recreation:   Leisure and Hobbies: Sports  Strengths/Needs:   What things does the patient do well?: Good with people In what areas does patient struggle / problems for patient: Money, consistency and eye contact  Discharge Plan:   Does patient have access to transportation?: Yes Will patient be returning to same living situation after discharge?: Yes Currently receiving community mental health services: Yes (From Whom) Vesta Mixer) Does patient have financial barriers related to discharge medications?: No  Summary/Recommendations:   Summary and Recommendations (to be completed by the evaluator): Patient is 32 YO single disabled caucasian male admitted with diagnosis of  Bipolar Disorder NOS. Patient would benefit from crisis stabilization, medication evaluation, therapy groups for processing  thoughts/feelings/experiences, psycho ed groups for coping skills, and case  management for discharge planning    Clide Dales. 11/03/2012

## 2012-11-03 NOTE — BHH Suicide Risk Assessment (Signed)
BHH INPATIENT:  Family/Significant Other Suicide Prevention Education  Suicide Prevention Education:  Patient Refusal for Family/Significant Other Suicide Prevention Education: The patient [name] has refused to provide written consent for family/significant other to be provided Family/Significant Other Suicide Prevention Education during admission and/or prior to discharge.  Physician notified.  Writer provided suicide prevention education directly to patient; conversation included risk factors, warning signs and resources to contact for help. Mobile crisis services explained and contact card placed in chart for pt to receive at discharge.  Clide Dales 11/03/2012 11:10 AM

## 2012-11-03 NOTE — BHH Suicide Risk Assessment (Signed)
Suicide Risk Assessment  Discharge Assessment     Demographic Factors:  Male and Caucasian  Mental Status Per Nursing Assessment::   On Admission:     Current Mental Status by Physician: In full contact with reality. There are no suicidal ideas, plans or intent. His mood is euthymic. His affect is appropriate. He is willing and motivated to pursue further outpatient treatment. He is back on his medications and is comfortable with the way he is feeling   Loss Factors: NA  Historical Factors: NA  Risk Reduction Factors:   Sense of responsibility to family, Living with another person, especially a relative and Positive social support  Continued Clinical Symptoms:  Bipolar Disorder:   Depressive phase  Cognitive Features That Contribute To Risk: None identifed   Suicide Risk:  Minimal: No identifiable suicidal ideation.  Patients presenting with no risk factors but with morbid ruminations; may be classified as minimal risk based on the severity of the depressive symptoms  Discharge Diagnoses:   AXIS I:  Bipolar, Depressed AXIS II:  Deferred AXIS III:   Past Medical History  Diagnosis Date  . Bipolar 1 disorder    AXIS IV:  other psychosocial or environmental problems AXIS V:  61-70 mild symptoms  Plan Of Care/Follow-up recommendations:  Activity:  as tolerated Diet:  regular Will follow up outpatient basis Is patient on multiple antipsychotic therapies at discharge:  No   Has Patient had three or more failed trials of antipsychotic monotherapy by history:  No  Recommended Plan for Multiple Antipsychotic Therapies: N/A   Lauralynn Loeb A 11/03/2012, 12:42 PM

## 2012-11-03 NOTE — Progress Notes (Signed)
Patient ID: [name], male   DOB: 08/14/1980, 32 y.o.   MRN: [id number]  Patient discharged as ordered by MD. Pt denies SI/HI at this time; no s/s of distress noted currently. Pt given all of his belongings and provided with his discharge instructions, follow up appointment information. Pt given his medication samples provided by pharmacy as well as his prescriptions. Pt verbalizes an understanding of instructions.

## 2012-11-03 NOTE — Progress Notes (Signed)
Psychoeducational Group Note  Date:  11/03/2012 Time: 1000  Group Topic/Focus:  Therapeutic Activity   Additional Comments:  Khian did not attend group.  Karleen Hampshire Brittini 11/03/2012, 11:18 AM

## 2012-11-03 NOTE — Tx Team (Addendum)
Interdisciplinary Treatment Plan Update (Adult)  Date: 11/03/2012  Time Reviewed: 10:10 AM   Progress in Treatment: Attending groups: Yes Participating in groups: Yes Taking medication as prescribed:  Yes Tolerating medication:  Yes Family/Significant othe contact made: No, patient refused Patient understands diagnosis: Yes Discussing patient identified problems/goals with staff: Yes Medical problems stabilized or resolved:  Yes Denies suicidal/homicidal ideation: Yes Patient has not harmed self or Others: Yes  New problem(s) identified: None Identified  Discharge Plan or Barriers:  CSW to referr patient to [facility]  Additional comments: N/A  Reason for Continuation of Hospitalization:   Estimated length of stay: Discharge today  For review of initial/current patient goals, please see plan of care.  Attendees: Patient:     Family:     Physician:  Geoffery Lyons 11/03/2012 10:10 AM   Nursing:   Roswell Miners, RN 11/03/2012 10:10 AM   Clinical Social Worker Ronda Fairly 11/03/2012 10:10 AM   Other:  Jannifer Hick, Sherrie Sport PA Student 11/03/2012 10:10 AM   Other:  11/03/2012 10:10 AM   Other:   11/03/2012 10:10 AM   Other:   11/03/2012 10:10 AM    Scribe for Treatment Team:   Carney Bern, LCSWA  11/03/2012 10:10 AM

## 2012-11-03 NOTE — Discharge Summary (Signed)
Physician Discharge Summary Note  Patient:  [name] is an 32 y.o., male MRN:  [id number] DOB:  05/25/1981 Patient phone:  [phone] (home)  Patient address:   [address] [place] [address],   Date of Admission:  11/01/2012 Date of Discharge: 11/03/2012   Reason for Admission:  Bipolar affective disorder  Discharge Diagnoses: Principal Problem:   Bipolar affective disorder  Review of Systems  Constitutional: Negative.  Negative for fever, chills, weight loss, malaise/fatigue and diaphoresis.  HENT: Negative for congestion and sore throat.   Eyes: Negative for blurred vision, double vision and photophobia.  Respiratory: Negative for cough, shortness of breath and wheezing.   Cardiovascular: Negative for chest pain, palpitations and PND.  Gastrointestinal: Negative for heartburn, nausea, vomiting, abdominal pain, diarrhea and constipation.  Musculoskeletal: Negative for myalgias, joint pain and falls.  Neurological: Negative for dizziness, tingling, tremors, sensory change, speech change, focal weakness, seizures, loss of consciousness, weakness and headaches.  Endo/Heme/Allergies: Negative for polydipsia. Does not bruise/bleed easily.  Psychiatric/Behavioral: Negative for depression, suicidal ideas, hallucinations, memory loss and substance abuse. The patient is not nervous/anxious and does not have insomnia.   Discharge Diagnoses:  AXIS I: Bipolar, Depressed  AXIS II: Deferred  AXIS III:  Past Medical History   Diagnosis  Date   .  Bipolar 1 disorder     AXIS IV: other psychosocial or environmental problems  AXIS V: 61-70 mild symptoms Associated Signs/Synptoms:  Depression Symptoms: psychomotor agitation,  difficulty concentrating,  anxiety,  loss of energy/fatigue,  (Hypo) Manic Symptoms: Distractibility,  Elevated Mood,  Impulsivity,  Labiality of Mood,  Anxiety Symptoms: Excessive Worry,  Panic Symptoms,     Level of Care:  OP  Hospital Course:   Dontavian was admitted as a walk in to [facility] where he presented reporting extreme anxiety and racing thoughts with inability to concentrate. He reported that he could not get in touch with his out patient provider and that he had stopped all of his medication.     He was admitted for stabilization and crisis management. Upon further evaluation he reported symptoms of fatigue, distractibility, elevated mood, impulsivity, mood lability, excessive worry, with symptoms of panic.  He denied SI/HI as well as AVH.     Bengie was started on Depakote 500mg , Lexapro, Gabapentin, and risperidone.  He was evaluated each day by a clinical provider to assess his response to treatment.  Progress was noted by his affect, his reports of decreasing or diminishing symptoms especially in mood, thought process, improved sleep and appetite.    By the day of discharge Herberto was in much improved condition. He was reporting a good reduction in symptoms and felt ready to go home. He denied SI/HI and AVH. His mood was positive and up beat and optimistic for the future.  He was discharged out with the plan for follow up care as noted below.  Consults:  None  Significant Diagnostic Studies:  CBC, CMP  Discharge Vitals:   Blood pressure 113/77, pulse 80, temperature 97.5 F (36.4 C), temperature source Oral, resp. rate 16, height 5\' 9"  (1.753 m), weight 77.111 kg (170 lb). Body mass index is 25.09 kg/(m^2). Lab Results:   Results for orders placed during the hospital encounter of 11/01/12 (from the past 72 hour(s))  CBC WITH DIFFERENTIAL     Status: None   Collection Time    11/01/12  6:55 PM      Result Value Range   WBC 9.1  4.0 - 10.5 K/uL  RBC 4.86  4.22 - 5.81 MIL/uL   Hemoglobin 14.8  13.0 - 17.0 g/dL   HCT 16.1  09.6 - 04.5 %   MCV 92.6  78.0 - 100.0 fL   MCH 30.5  26.0 - 34.0 pg   MCHC 32.9  30.0 - 36.0 g/dL   RDW 40.9  81.1 - 91.4 %   Platelets 235  150 - 400 K/uL   Neutrophils Relative % 56  43 - 77 %    Neutro Abs 5.1  1.7 - 7.7 K/uL   Lymphocytes Relative 33  12 - 46 %   Lymphs Abs 3.0  0.7 - 4.0 K/uL   Monocytes Relative 9  3 - 12 %   Monocytes Absolute 0.8  0.1 - 1.0 K/uL   Eosinophils Relative 2  0 - 5 %   Eosinophils Absolute 0.2  0.0 - 0.7 K/uL   Basophils Relative 0  0 - 1 %   Basophils Absolute 0.0  0.0 - 0.1 K/uL  COMPREHENSIVE METABOLIC PANEL     Status: Abnormal   Collection Time    11/02/12  6:49 AM      Result Value Range   Sodium 140  135 - 145 mEq/L   Potassium 4.3  3.5 - 5.1 mEq/L   Chloride 103  96 - 112 mEq/L   CO2 30  19 - 32 mEq/L   Glucose, Bld 88  70 - 99 mg/dL   BUN 12  6 - 23 mg/dL   Creatinine, Ser 7.82  0.50 - 1.35 mg/dL   Calcium 9.4  8.4 - 95.6 mg/dL   Total Protein 7.0  6.0 - 8.3 g/dL   Albumin 3.8  3.5 - 5.2 g/dL   AST 13  0 - 37 U/L   ALT 19  0 - 53 U/L   Alkaline Phosphatase 73  39 - 117 U/L   Total Bilirubin 0.7  0.3 - 1.2 mg/dL   GFR calc non Af Amer 78 (*) >90 mL/min   GFR calc Af Amer >90  >90 mL/min   Comment:            The eGFR has been calculated     using the CKD EPI equation.     This calculation has not been     validated in all clinical     situations.     eGFR's persistently     <90 mL/min signify     possible Chronic Kidney Disease.  TSH     Status: None   Collection Time    11/02/12  6:49 AM      Result Value Range   TSH 2.555  0.350 - 4.500 uIU/mL    Physical Findings: AIMS: Facial and Oral Movements Muscles of Facial Expression: None, normal Lips and Perioral Area: None, normal Jaw: None, normal Tongue: None, normal,Extremity Movements Upper (arms, wrists, hands, fingers): None, normal Lower (legs, knees, ankles, toes): None, normal, Trunk Movements Neck, shoulders, hips: None, normal, Overall Severity Severity of abnormal movements (highest score from questions above): None, normal Incapacitation due to abnormal movements: None, normal Patient's awareness of abnormal movements (rate only patient's report): No  Awareness, Dental Status Current problems with teeth and/or dentures?: No Does patient usually wear dentures?: No  CIWA:  CIWA-Ar Total: 0 COWS:     Psychiatric Specialty Exam: See Psychiatric Specialty Exam and Suicide Risk Assessment completed by Attending Physician prior to discharge.  Discharge destination:  Home  Is patient on multiple antipsychotic therapies at discharge:  No   Has Patient had three or more failed trials of antipsychotic monotherapy by history:  No  Recommended Plan for Multiple Antipsychotic Therapies: Not applicable  Discharge Orders   Future Orders Complete By Expires     Diet - low sodium heart healthy  As directed     Discharge instructions  As directed     Comments:      Take all of your medications as directed. Be sure to keep all of your follow up appointments.  If you are unable to keep your follow up appointment, call your Doctor's office to let them know, and reschedule.  Make sure that you have enough medication to last until your appointment. Be sure to get plenty of rest. Going to bed at the same time each night will help. Try to avoid sleeping during the day.  Increase your activity as tolerated. Regular exercise will help you to sleep better and improve your mental health. Eating a heart healthy diet is recommended. Try to avoid salty or fried foods. Be sure to avoid all alcohol and illegal drugs.    Increase activity slowly  As directed         Medication List    STOP taking these medications       divalproex 500 MG 24 hr tablet  Commonly known as:  DEPAKOTE ER      TAKE these medications     Indication   albuterol 108 (90 BASE) MCG/ACT inhaler  Commonly known as:  PROVENTIL HFA;VENTOLIN HFA  Inhale 2 puffs into the lungs every 6 (six) hours as needed for wheezing.    for wheezing   divalproex 500 MG DR tablet  Commonly known as:  DEPAKOTE  Take 1 tablet (500 mg total) by mouth daily. For mood stabilization and prevention of  seizures.   Indication:  mood disorder     escitalopram 10 MG tablet  Commonly known as:  LEXAPRO  Take 1 tablet (10 mg total) by mouth daily. For anxiety and depression.   Indication:  Depression     gabapentin 100 MG capsule  Commonly known as:  NEURONTIN  Take 1 capsule (100 mg total) by mouth 3 (three) times daily. For anxiety, agitation, neurogenic pain.   Indication:  Agitation, Neurogenic Pain     risperiDONE 0.5 MG tablet  Commonly known as:  RISPERDAL  Take one tablet each morning (0.5mg ) and evening (0.5mg ) and two tablets at bedtime (1mg ) for anxiety and sleep.   Indication:  Manic-Depression, Easily Angered or Annoyed         Follow-up recommendations:   Activities: Resume activity as tolerated. Diet: Heart healthy low sodium diet Tests: Follow up testing will be determined by your out patient provider. Comments:   Continue to work on the life style changes and pursue the therapeutic interventions that would facilitate the remission of symptoms. Comply with taking your medications daily Total Discharge Time:  Greater than 30 minutes.  Signed: Rona Ravens. [place]burn RPAC 11:18 AM 11/03/2012

## 2012-11-05 NOTE — Progress Notes (Signed)
Patient Discharge Instructions:  After Visit Summary (AVS):   Faxed to:  11/05/12 Discharge Summary Note:   Faxed to:  11/05/12 Psychiatric Admission Assessment Note:   Faxed to:  11/05/12 Suicide Risk Assessment - Discharge Assessment:   Faxed to:  11/05/12 Faxed/Sent to the Next Level Care provider:  11/05/12 Faxed to [facility] @ [phone]  Jerelene Redden, 11/05/2012, 4:07 PM

## 2013-01-04 IMAGING — CR DG HAND COMPLETE 3+V*R*
3 series · 3 of 3 positions shown · non-contrast
Comparison: None.

CLINICAL DATA: Injury

RIGHT HAND - COMPLETE 3+ VIEW

[x hand ap right]
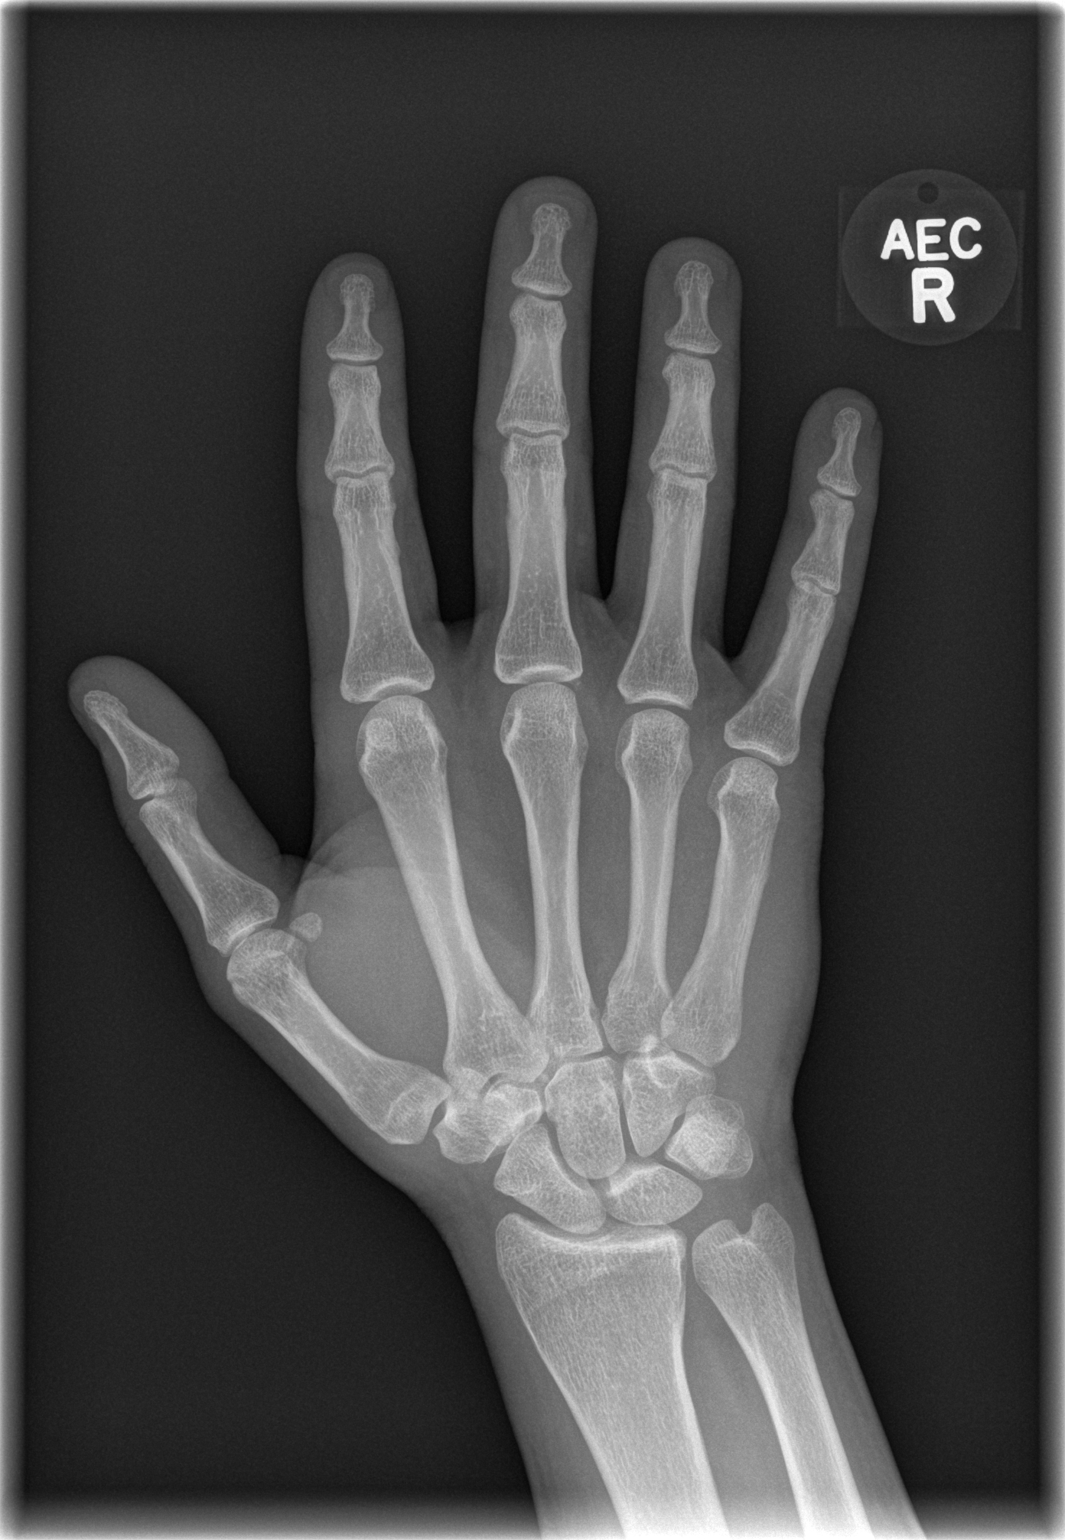

[x hand oblique right]
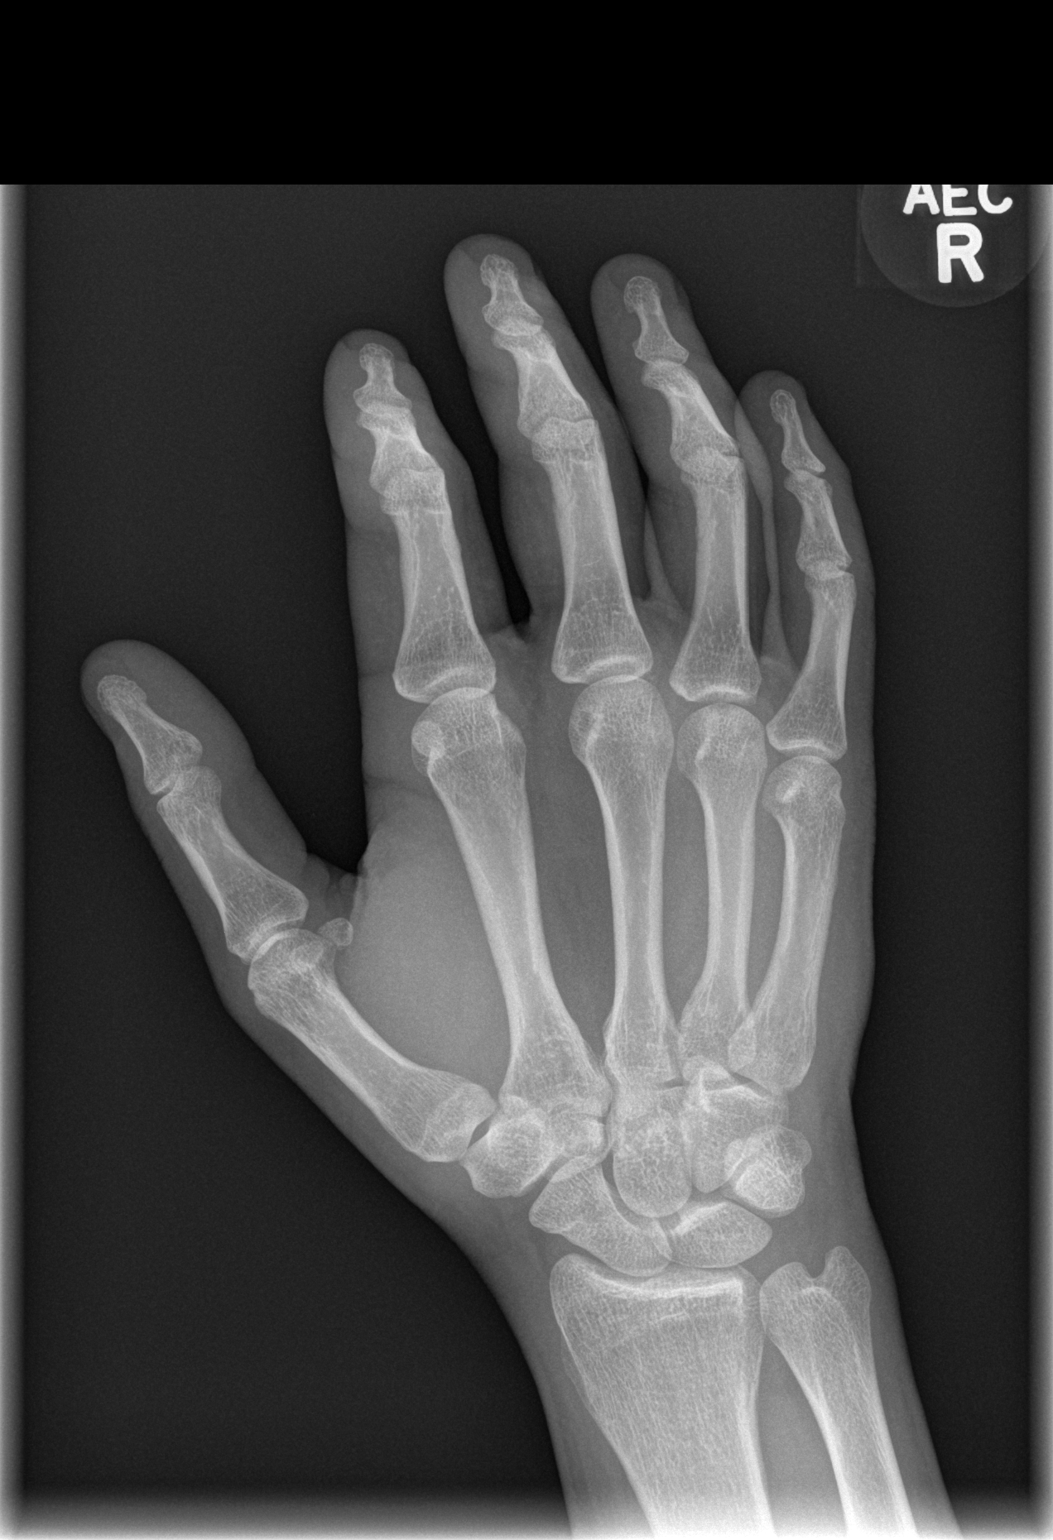

[x hand lat right]
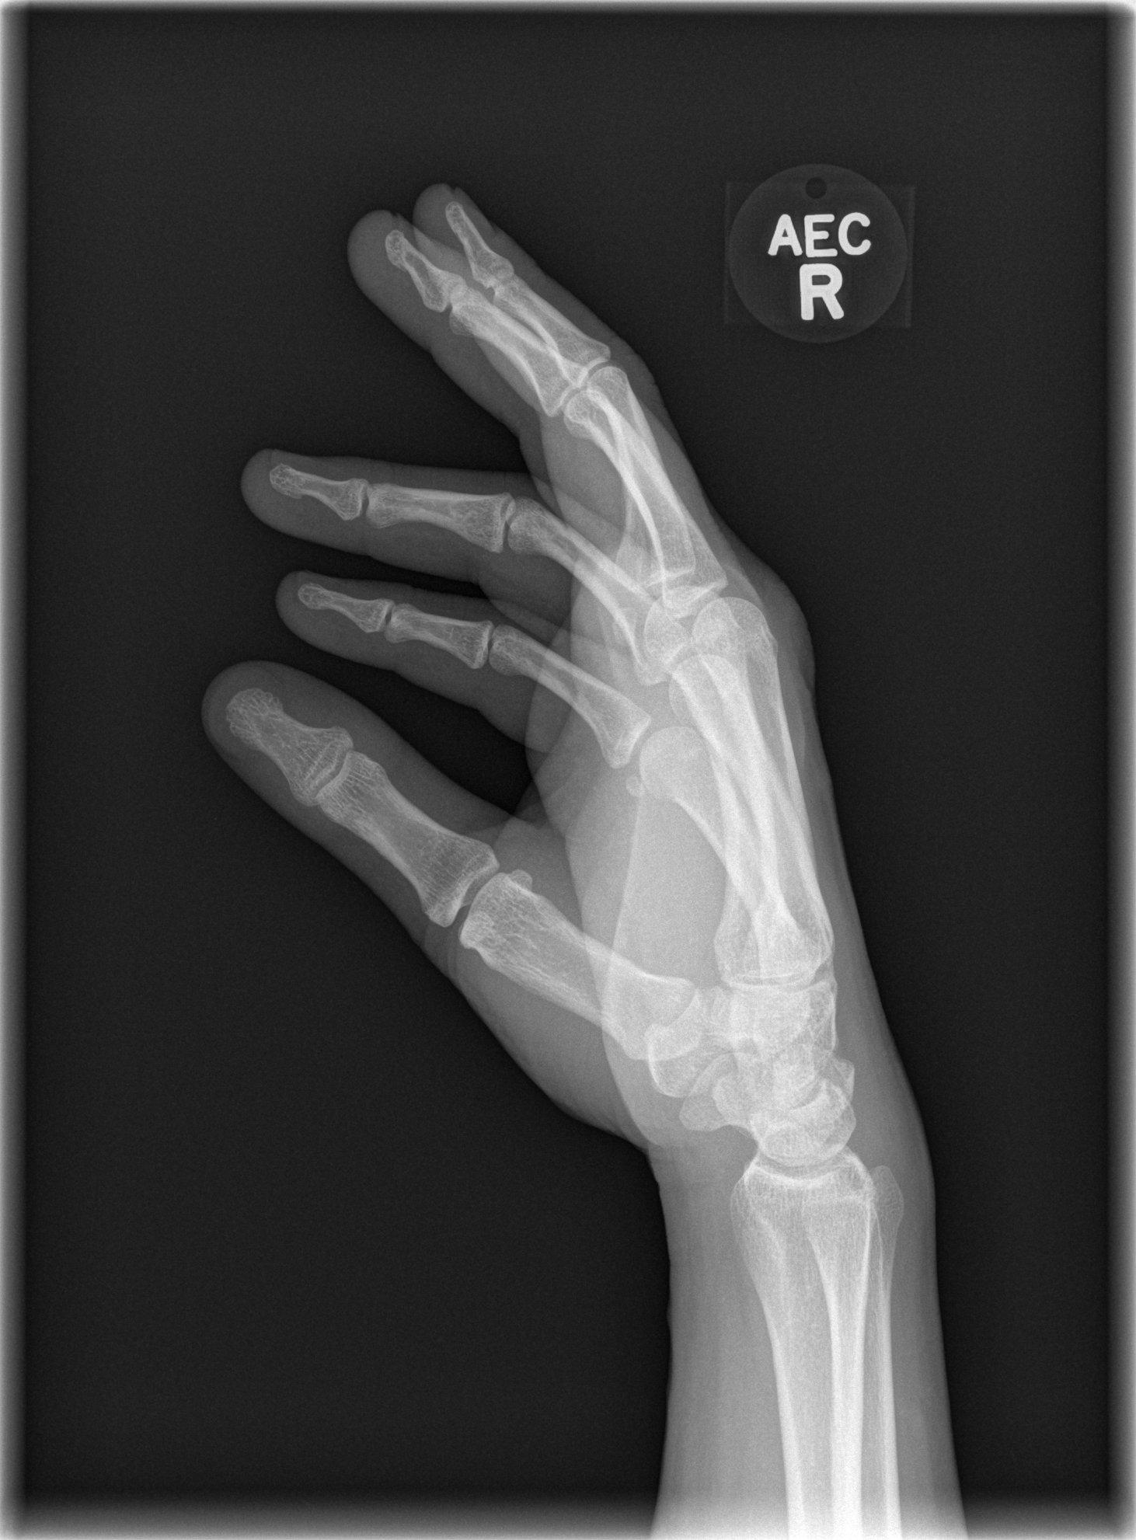

[3 of 3 positions shown; findings below may reference images not displayed]

FINDINGS: No acute fracture and no dislocation.
IMPRESSION: No acute bony injury.

## 2013-05-18 ENCOUNTER — Emergency Department (HOSPITAL_COMMUNITY)
Admission: EM | Admit: 2013-05-18 | Discharge: 2013-05-18 | Disposition: A | Discharge status: Home / Self Care | Payer: Medicare Other | Attending: Emergency Medicine | Admitting: Emergency Medicine

## 2013-05-18 ENCOUNTER — Encounter (HOSPITAL_COMMUNITY): Payer: Self-pay | Admitting: Emergency Medicine

## 2013-05-18 ENCOUNTER — Emergency Department (HOSPITAL_COMMUNITY): Payer: Medicare Other

## 2013-05-18 DIAGNOSIS — F319 Bipolar disorder, unspecified: Secondary | ICD-10-CM | POA: Insufficient documentation

## 2013-05-18 DIAGNOSIS — F17200 Nicotine dependence, unspecified, uncomplicated: Secondary | ICD-10-CM | POA: Insufficient documentation

## 2013-05-18 DIAGNOSIS — Z79899 Other long term (current) drug therapy: Secondary | ICD-10-CM | POA: Insufficient documentation

## 2013-05-18 DIAGNOSIS — M79609 Pain in unspecified limb: Secondary | ICD-10-CM | POA: Insufficient documentation

## 2013-05-18 DIAGNOSIS — M79671 Pain in right foot: Secondary | ICD-10-CM

## 2013-05-18 MED ORDER — MELOXICAM 7.5 MG PO TABS: 15.0000 mg | ORAL_TABLET | Freq: Every day | ORAL | Status: DC

## 2013-05-18 MED ORDER — PREDNISONE 20 MG PO TABS: 40.0000 mg | ORAL_TABLET | Freq: Every day | ORAL | Status: DC

## 2013-05-18 NOTE — ED Provider Notes (Signed)
CSN: [id number]     Arrival date & time 05/18/13  1349 History  This chart was scribed for non-physician practitioner Antony Madura, PA-C working with Dagmar Hait, MD by Clydene Laming, ED Scribe. This patient was seen in room WTR9/WTR9 and the patient's care was started at 4:23 PM.   Chief Complaint  Patient presents with  . Foot Pain   The history is provided by the patient. No language interpreter was used.   HPI Comments: [name] is a 32 y.o. male who presents to the Emergency Department complaining of intermittent, shooting right foot pain that is radiating up the right leg to the knee onset two weeks ago. The pain wakes him up screaming. Pt denies any trauma or injury. There is no pain when area palpation. 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 Pt is ambulatory. He denies pallor, numbness, weakness, and an inability to ambulate.  Past Medical History  Diagnosis Date  . Bipolar 1 disorder    Past Surgical History  Procedure Laterality Date  . Back surgery     No family history on file. History  Substance Use Topics  . Smoking status: Current Every Day Smoker -- 0.50 packs/day for 5 years    Types: Cigarettes  . Smokeless tobacco: Never Used  . Alcohol Use: Yes     Comment: occa    Review of Systems  Musculoskeletal: Positive for myalgias (right foot/leg). Negative for joint swelling.    Allergies  Review of patient's allergies indicates no known allergies.  Home Medications   Current Outpatient Rx  Name  Route  Sig  Dispense  Refill  . albuterol (PROVENTIL HFA;VENTOLIN HFA) 108 (90 BASE) MCG/ACT inhaler   Inhalation   Inhale 2 puffs into the lungs every 6 (six) hours as needed for wheezing.         . citalopram (CELEXA) 10 MG tablet   Oral   Take 10 mg by mouth daily.         . divalproex (DEPAKOTE) 500 MG DR tablet   Oral   Take 1 tablet (500 mg total) by mouth daily. For mood stabilization and prevention of  seizures.   30 tablet   0   . gabapentin (NEURONTIN) 100 MG capsule   Oral   Take 1 capsule (100 mg total) by mouth 3 (three) times daily. For anxiety, agitation, neurogenic pain.   90 capsule   0   . meloxicam (MOBIC) 7.5 MG tablet   Oral   Take 2 tablets (15 mg total) by mouth daily.   30 tablet   0   . predniSONE (DELTASONE) 20 MG tablet   Oral   Take 2 tablets (40 mg total) by mouth daily.   10 tablet   0    Triage Vitals:BP 131/71  Pulse 69  Temp(Src) 98.1 F (36.7 C) (Oral)  Resp 16  SpO2 100% Physical Exam  Nursing note and vitals reviewed. Constitutional: He is oriented to person, place, and time. He appears well-developed and well-nourished. No distress.  HENT:  Head: Normocephalic and atraumatic.  Eyes: Conjunctivae and EOM are normal. No scleral icterus.  Neck: Normal range of motion.  Cardiovascular: Normal rate, regular rhythm and intact distal pulses.   Pulses:      Dorsalis pedis pulses are 2+ on the right side.       Posterior tibial pulses are 2+ on the right side.  Pulmonary/Chest: Effort normal. No respiratory distress.  Musculoskeletal:  Normal range of motion.       Right ankle: Normal.       Right foot: He exhibits normal range of motion, no tenderness, no bony tenderness, no swelling, normal capillary refill, no crepitus and no deformity.  Neurological: He is alert and oriented to person, place, and time. He has normal strength and normal reflexes. No sensory deficit.  Reflex Scores:      Patellar reflexes are 2+ on the right side and 2+ on the left side.      Achilles reflexes are 2+ on the right side and 2+ on the left side. No numbness, tingling or weakness of the affected extremity. Patient ambulatory with normal gait.  Skin: Skin is warm and dry. No rash noted. He is not diaphoretic. No erythema. No pallor.  Psychiatric: He has a normal mood and affect. His behavior is normal.    ED Course  Procedures (including critical care  time) DIAGNOSTIC STUDIES: Oxygen Saturation is 100% on RA, normal by my interpretation.    COORDINATION OF CARE: 4:28 PM- Discussed treatment plan with pt at bedside. Pt verbalized understanding and agreement with plan.   Labs Review Labs Reviewed - No data to display Imaging Review Dg Foot Complete Right  05/18/2013   CLINICAL DATA:  Pain.  EXAM: RIGHT FOOT COMPLETE - 3+ VIEW  COMPARISON:  None.  FINDINGS: There is no evidence of fracture or dislocation. There is no evidence of arthropathy or other focal bone abnormality. Soft tissues are unremarkable.  IMPRESSION: Negative.   Electronically Signed   By: Maisie Fus  Register   On: 05/18/2013 16:43    EKG Interpretation   None       MDM   1. Foot pain, right    Uncomplicated R foot pain. Pain atraumatic in onset, sporadic, and resolved spontaneously. Physical exam unremarkable; no specific point tenderness palpated. Patient neurovascularly intact without joint effusion, crepitus, or deformity. He is ambulatory with normal gait. Imaging unremarkable. No evidence of septic joint. Will prescribe mobic and prednisone for symptoms as pain sounds, to some extent, to be neurologic in origin. Orthopedic f/u advised. Patient agreeable to plan with no unaddressed concerns; he is stable for d/c.  I personally performed the services described in this documentation, which was scribed in my presence. The recorded information has been reviewed and is accurate.     Antony Madura, PA-C 05/27/13 1324

## 2013-05-18 NOTE — ED Notes (Signed)
Pt left without discharge paperwork and prescriptions. 

## 2013-05-18 NOTE — ED Notes (Signed)
Pt c/o right foot pain; no known injury; symptoms x 2 wks

## 2013-05-18 NOTE — ED Notes (Signed)
Pt states he has sharp shooting foot pain that last 3 minutes at a time. Pt states he is awaken from his sleep. Pt states it happens when he is walking and sitting. Pt states his inner calf will go numb at times. Pt states his dad had the same symptoms and he has circulatory problems.

## 2013-05-27 NOTE — ED Provider Notes (Signed)
Medical screening examination/treatment/procedure(s) were performed by non-physician practitioner and as supervising physician I was immediately available for consultation/collaboration.  EKG Interpretation   None         William Rushi Chasen, MD 05/27/13 1642 

## 2013-10-26 ENCOUNTER — Encounter (HOSPITAL_COMMUNITY): Payer: Self-pay | Admitting: Emergency Medicine

## 2013-10-26 ENCOUNTER — Emergency Department (INDEPENDENT_AMBULATORY_CARE_PROVIDER_SITE_OTHER)
Admission: EM | Admit: 2013-10-26 | Discharge: 2013-10-26 | Disposition: A | Discharge status: Home / Self Care | Payer: Medicare Other | Source: Home / Self Care | Attending: Family Medicine | Admitting: Family Medicine

## 2013-10-26 DIAGNOSIS — L738 Other specified follicular disorders: Secondary | ICD-10-CM

## 2013-10-26 DIAGNOSIS — L739 Follicular disorder, unspecified: Secondary | ICD-10-CM

## 2013-10-26 MED ORDER — MINOCYCLINE HCL 100 MG PO CAPS: 100.0000 mg | ORAL_CAPSULE | Freq: Two times a day (BID) | ORAL | Status: DC

## 2013-10-26 NOTE — ED Provider Notes (Signed)
[name] is a 33 y.o. male who presents to Urgent Care today for left forearm pain and swelling. Present for 2 weeks. Worsened recently. No fevers chills nausea vomiting or diarrhea. 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 He feels well otherwise. No medications tried.   Past Medical History  Diagnosis Date  . Bipolar 1 disorder    History  Substance Use Topics  . Smoking status: Former Smoker -- 0.50 packs/day for 5 years    Types: Cigarettes    Quit date: 06/04/2013  . Smokeless tobacco: Never Used  . Alcohol Use: No     Comment: occa   ROS as above Medications: No current facility-administered medications for this encounter.   Current Outpatient Prescriptions  Medication Sig Dispense Refill  . citalopram (CELEXA) 10 MG tablet Take 10 mg by mouth daily.      . divalproex (DEPAKOTE) 500 MG DR tablet Take 1 tablet (500 mg total) by mouth daily. For mood stabilization and prevention of seizures.  30 tablet  0  . gabapentin (NEURONTIN) 100 MG capsule Take 1 capsule (100 mg total) by mouth 3 (three) times daily. For anxiety, agitation, neurogenic pain.  90 capsule  0  . albuterol (PROVENTIL HFA;VENTOLIN HFA) 108 (90 BASE) MCG/ACT inhaler Inhale 2 puffs into the lungs every 6 (six) hours as needed for wheezing.      . minocycline (MINOCIN,DYNACIN) 100 MG capsule Take 1 capsule (100 mg total) by mouth 2 (two) times daily.  20 capsule  0    Exam:  BP 126/62  Pulse 60  Temp(Src) 98.9 F (37.2 C) (Oral)  Resp 16  SpO2 100% Gen: Well NAD Skin: Nickel-sized erythematous indurated tender papule left forearm. No fluctuance or surrounding erythema.  No results found for this or any previous visit (from the past 24 hour(s)). No results found.  Assessment and Plan: 33 y.o. male with folliculitis versus cellulitis. Not yet an abscess. Plan to treat with minocycline. Warned about sun exposure.  Discussed warning signs or symptoms. Please see discharge  instructions. Patient expresses understanding.    Rodolph Bong, MD 10/26/13 [phone]

## 2013-10-26 NOTE — ED Notes (Signed)
C/o abscess L forearm onset 2 weeks ago.

## 2013-10-26 NOTE — Discharge Instructions (Signed)
Thank you for coming in today. Take the minocycline twice daily for 10 days.  Use sunscreen.   Folliculitis  Folliculitis is redness, soreness, and swelling (inflammation) of the hair follicles. This condition can occur anywhere on the body. People with weakened immune systems, diabetes, or obesity have a greater risk of getting folliculitis. CAUSES  Bacterial infection. This is the most common cause.  Fungal infection.  Viral infection.  Contact with certain chemicals, especially oils and tars. Long-term folliculitis can result from bacteria that live in the nostrils. The bacteria may trigger multiple outbreaks of folliculitis over time. SYMPTOMS Folliculitis most commonly occurs on the scalp, thighs, legs, back, buttocks, and areas where hair is shaved frequently. An early sign of folliculitis is a small, white or yellow, pus-filled, itchy lesion (pustule). These lesions appear on a red, inflamed follicle. They are usually less than 0.2 inches (5 mm) wide. When there is an infection of the follicle that goes deeper, it becomes a boil or furuncle. A group of closely packed boils creates a larger lesion (carbuncle). Carbuncles tend to occur in hairy, sweaty areas of the body. DIAGNOSIS  Your caregiver can usually tell what is wrong by doing a physical exam. A sample may be taken from one of the lesions and tested in a lab. This can help determine what is causing your folliculitis. TREATMENT  Treatment may include:  Applying warm compresses to the affected areas.  Taking antibiotic medicines orally or applying them to the skin.  Draining the lesions if they contain a large amount of pus or fluid.  Laser hair removal for cases of long-lasting folliculitis. This helps to prevent regrowth of the hair. HOME CARE INSTRUCTIONS  Apply warm compresses to the affected areas as directed by your caregiver.  If antibiotics are prescribed, take them as directed. Finish them even if you start to  feel better.  You may take over-the-counter medicines to relieve itching.  Do not shave irritated skin.  Follow up with your caregiver as directed. SEEK IMMEDIATE MEDICAL CARE IF:   You have increasing redness, swelling, or pain in the affected area.  You have a fever. MAKE SURE YOU:  Understand these instructions.  Will watch your condition.  Will get help right away if you are not doing well or get worse. Document Released: 07/30/2001 Document Revised: 11/20/2011 Document Reviewed: 08/21/2011 [facility] Patient Information 2014 [place], [place].

## 2014-03-29 ENCOUNTER — Encounter (HOSPITAL_COMMUNITY): Payer: Self-pay | Admitting: Emergency Medicine

## 2014-03-29 ENCOUNTER — Emergency Department (HOSPITAL_COMMUNITY)
Admission: EM | Admit: 2014-03-29 | Discharge: 2014-03-29 | Disposition: A | Discharge status: Home / Self Care | Payer: Medicare Other | Attending: Emergency Medicine | Admitting: Emergency Medicine

## 2014-03-29 DIAGNOSIS — Z792 Long term (current) use of antibiotics: Secondary | ICD-10-CM | POA: Diagnosis not present

## 2014-03-29 DIAGNOSIS — Z79899 Other long term (current) drug therapy: Secondary | ICD-10-CM | POA: Insufficient documentation

## 2014-03-29 DIAGNOSIS — F319 Bipolar disorder, unspecified: Secondary | ICD-10-CM | POA: Diagnosis not present

## 2014-03-29 DIAGNOSIS — F1120 Opioid dependence, uncomplicated: Secondary | ICD-10-CM | POA: Diagnosis not present

## 2014-03-29 DIAGNOSIS — Z87891 Personal history of nicotine dependence: Secondary | ICD-10-CM | POA: Insufficient documentation

## 2014-03-29 MED ORDER — PROMETHAZINE HCL 12.5 MG PO TABS: 12.5000 mg | ORAL_TABLET | Freq: Four times a day (QID) | ORAL | Status: DC | PRN

## 2014-03-29 MED ORDER — ALPRAZOLAM 0.25 MG PO TABS: 0.2500 mg | ORAL_TABLET | Freq: Three times a day (TID) | ORAL | Status: DC | PRN

## 2014-03-29 NOTE — ED Notes (Signed)
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  States that he has been using opiate pills.  States that he has been drinking more than he used to but does not drink daily.  Last drink was 2 wks ago.  Last cocaine was Friday.  Last opiate was Monday or Tuesday of last week.  Denies SI/HI.

## 2014-03-29 NOTE — Discharge Instructions (Signed)
Opioid Use Disorder °Opioid use disorder is a mental disorder. It is the continued nonmedical use of opioids in spite of risks to health and well-being. Misused opioids include the street drug heroin. They also include pain medicines such as morphine, hydrocodone, oxycodone, and fentanyl. Opioids are very addictive. People who misuse opioids get an exaggerated feeling of well-being. Opioid use disorder often disrupts activities at home, work, or school. It may cause mental or physical problems.  °A family history of opioid use disorder puts you at higher risk of it. People with opioid use disorder often misuse other drugs or have mental illness such as depression, posttraumatic stress disorder, or antisocial personality disorder. They also are at risk of suicide and death from overdose. °SIGNS AND SYMPTOMS  °Signs and symptoms of opioid use disorder include: °· Use of opioids in larger amounts or over a longer period than intended. °· Unsuccessful attempts to cut down or control opioid use. °· A lot of time spent obtaining, using, or recovering from the effects of opioids. °· A strong desire or urge to use opioids (craving). °· Continued use of opioids in spite of major problems at work, school, or home because of use. °· Continued use of opioids in spite of relationship problems because of use. °· Giving up or cutting down on important life activities because of opioid use. °· Use of opioids over and over in situations when it is physically hazardous, such as driving a car. °· Continued use of opioids in spite of a physical problem that is likely related to use. Physical problems can include: °¨ Severe constipation. °¨ Poor nutrition. °¨ Infertility. °¨ Tuberculosis. °¨ Aspiration pneumonia. °¨ Infections such as human immunodeficiency virus (HIV) and hepatitis (from injecting opioids). °· Continued use of opioids in spite of a mental problem that is likely related to use. Mental problems can  include: °¨ Depression. °¨ Anxiety. °¨ Hallucinations. °¨ Sleep problems. °¨ Loss of sexual function. °· Need to use more and more opioids to get the same effect, or lessened effect over time with use of the same amount (tolerance). °· Having withdrawal symptoms when opioid use is stopped, or using opioids to reduce or avoid withdrawal symptoms. Withdrawal symptoms include: °¨ Depressed, anxious, or irritable mood. °¨ Nausea, vomiting, diarrhea, or intestinal cramping. °¨ Muscle aches or spasms. °¨ Excessive tearing or runny nose. °¨ Dilated pupils, sweating, or hairs standing on end. °¨ Yawning. °¨ Fever, raised blood pressure, or fast pulse. °¨ Restlessness or trouble sleeping. This does not apply to people taking opioids for medical reasons only. °DIAGNOSIS °Opioid use disorder is diagnosed by your health care provider. You may be asked questions about your opioid use and and how it affects your life. A physical exam may be done. A drug screen may be ordered. You may be referred to a mental health professional. The diagnosis of opioid use disorder requires at least two symptoms within 12 months. The type of opioid use disorder you have depends on the number of signs and symptoms you have. The type may be: °· Mild. Two or three signs and symptoms.    °· Moderate. Four or five signs and symptoms.   °· Severe. Six or more signs and symptoms. °TREATMENT  °Treatment is usually provided by mental health professionals with training in substance use disorders. The following options are available: °· Detoxification. This is the first step in treatment for withdrawal. It is medically supervised withdrawal with the use of medicines. These medicines lessen withdrawal symptoms. They also raise the chance   of becoming opioid free. °· Counseling, also known as talk therapy. Talk therapy addresses the reasons you use opioids. It also addresses ways to keep you from using again (relapse). The goals of talk therapy are to avoid  relapse by: °¨ Identifying and avoiding triggers for use. °¨ Finding healthy ways to cope with stress. °¨ Learning how to handle cravings. °· Support groups. Support groups provide emotional support, advice, and guidance. °· A medicine that blocks opioid receptors in your brain. This medicine can reduce opioid cravings that lead to relapse. This medicine also blocks the desired opioid effect when relapse occurs. °· Opioids that are taken by mouth in place of the misused opioid (opioid maintenance treatment). These medicines satisfy cravings but are safer than commonly misused opioids. This often is the best option for people who continue to relapse with other treatments. °HOME CARE INSTRUCTIONS  °· Take medicines only as directed by your health care provider. °· Check with your health care provider before starting new medicines. °· Keep all follow-up visits as directed by your health care provider. °SEEK MEDICAL CARE IF: °· You are not able to take your medicines as directed. °· Your symptoms get worse. °SEEK IMMEDIATE MEDICAL CARE IF: °· You have serious thoughts about hurting yourself or others. °· You may have taken an overdose of opioids. °FOR MORE INFORMATION °· National Institute on Drug Abuse: www.drugabuse.gov °· Substance Abuse and Mental Health Services Administration: www.samhsa.gov °Document Released: 03/18/2007 Document Revised: 10/05/2013 Document Reviewed: 06/03/2013 °ExitCare® Patient Information ©2015 [employer]. This information is not intended to replace advice given to you by your health care provider. Make sure you discuss any questions you have with your health care provider. °Substance Abuse Treatment Programs ° °Intensive Outpatient Programs °[facility]     °[address]      °[place], [place]                   °[phone]      ° °[facility] °[address] °[place], [place] °[phone] ° ° Behavioral Health Outpatient     °(Inpatient  and outpatient)     °[address]           °[phone]   ° °[facility] °[phone] (Suboxone and Methadone) ° °[address]      °[place], [place] [address]      °[phone]      ° °[address] °[place], [place] °[phone] ° °[facility] (Outpatient/Inpatient, [facility])    °(insurance only) [phone]      °       °[facility] (Groups & Residential) °[place], [place] °[phone] ° °   °[facility]     °[address]     °[place], [place]      °[phone]      ° °[facility] (for caregivers and family) °[address] °[place], [place] °[phone] ° ° ° ° ° °Residential Treatment Programs °[facility]      °[address], [place], [place] [address]  °[phone]      ° °[facility] °[address] [place], [place] [address] °[phone] ° °[facility]        °[phone]      ° °[facility] °[phone] ° °[facility]             °[address]                                         °  [place], [place]                                                [phone] or [phone]                               [facility] [place] [address] [place], [address] [phone]  [facility]    [address]      [place], [place]     [phone]       [facility] [address] [place], [place] [phone]  [facility]   [address] [place], [place] [address]     [phone]      Admissions: 8am-3pm M-F  [facility] ([facility] [address] [place], [place] [phone]  [facility]: Residential Program [phone] Days)   [place], [place]      [phone] or [phone]     [facility]: [facility] [place], [place] (Walk in Hours over the weekend or by referral)  [facility] [address] [employer], [place], [place] [address] [phone]  Crisis Mobile:  [facility]:  [phone] (for crisis response 24 hours a day) [facility] Hotline:      (915)058-8508 Outpatient Psychiatry and Counseling  [facility]: Mobile Crisis Management 24 hours:  [phone]  [facility] of the [employer] sliding scale fee and walk in schedule: M-F 8am-12pm/1pm-3pm [address]  [place], [place] [address] [phone]  [facility] [address] [place], [place] [address] [phone]  [facility] new patient walk-in appointments available Monday - Friday 8am -3pm.          [address] [place], [place] [address] [phone] or crisis line- [phone]  [facility]/ Intensive Outpatient Therapy Program [address] [place], [place] [address] [phone]  [facility]                  Crisis Services      [phone]     [place], [place] [address]                 [facility]   [facility] [phone]. [address] [place], [place] [address]   [facility] of Care          [address] [place]  [place], [place] [address]       [phone]  [facility] [address] [place], [place] [address] [phone]  [facility]    [address]    [place], [place] [address]     [phone]       Andee Poles, MD     [address]     [place] [address]     [phone]       [facility] [address] [place] [address]  [facility]     [address]     [place], [place]      [phone]       [facility] Eulogio D[place], [place] [phone]  [place], [place] [address] [place]     [address]     [place], [place]  [address]     [phone]       [facility] [address] [employer], [place] [address] [phone] Resources for Temporary Residential Assistance/Crisis [facility]) M-F 8am-3pm   [address] [place], [place] [address]   [phone] Services include: laundry, barbering, support groups, case management, phone  & computer access, showers, AA/NA mtgs, mental health/substance abuse nurse, job skills class, disability information, VA assistance, spiritual classes, etc.   [facility]   [address], [place]     [place]              [employer] (women and children)       [place]. [place], [place] [address] [phone] [email or web address] for application and process Application Required  [facility]   [address]    [place] [address]     [phone]                    [place], [place] [address] [phone](FHLKTGYB application appt.) Application Required  [facility] (women only)    [address]     [place], [place] [address]     [phone]      Intake starts 6pm daily Need valid ID, SSC, & Police report [facility] [address] [place], [place] [phone] Application Required  [employer] (men only)     [place].      [place], [place]       [facility] (Pregnant women only) [address] [place], [place]  [facility]      [place] [address]      [place], [place] [address]     [phone]             [facility] [address] [place], [place] 90 day commitment/SA/Application process  [facility](men only)     [address]     [place],  [place]       Check-in at [facility] [address] [place],  [address] [phone] Men/Women/Women and Children must be there by 7 pm  [place], [place]

## 2014-03-29 NOTE — ED Provider Notes (Signed)
CSN: [id number]     Arrival date & time 03/29/14  [id number] History   First MD Initiated Contact with Patient 03/29/14 [phone]     Chief Complaint  Patient presents with  . 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 Denies any abdominal pain or vomiting. Has had some nausea. No suicidal or homicidal ideations. No daily use of alcohol. Was in rehabilitation 1 year ago for 28 days was started using acid and she left rehabilitation. Call outside facilities and told to come here. Symptoms are persistent and worsening uses drugs. No treatment use prior to arrival  The history is provided by the patient and a friend.    Past Medical History  Diagnosis Date  . Bipolar 1 disorder    Past Surgical History  Procedure Laterality Date  . Back surgery  2011    had melanoma mole removed   Family History  Problem Relation Age of Onset  . Heart Problems Father    History  Substance Use Topics  . Smoking status: Former Smoker -- 0.50 packs/day for 5 years    Types: Cigarettes    Quit date: 06/04/2013  . Smokeless tobacco: Never Used  . Alcohol Use: Yes     Comment: occasionally    Review of Systems  All other systems reviewed and are negative.     Allergies  Review of patient's allergies indicates no known allergies.  Home Medications   Prior to Admission medications   Medication Sig Start Date End Date Taking? Authorizing Provider  albuterol (PROVENTIL HFA;VENTOLIN HFA) 108 (90 BASE) MCG/ACT inhaler Inhale 2 puffs into the lungs every 6 (six) hours as needed for wheezing.    Historical Provider, MD  citalopram (CELEXA) 10 MG tablet Take 10 mg by mouth daily.    Historical Provider, MD  divalproex (DEPAKOTE) 500 MG DR tablet Take 1 tablet (500 mg total) by mouth daily. For mood stabilization and prevention of seizures. 11/03/12   Verne SpurrNeil  Mashburn, PA-C  gabapentin (NEURONTIN) 100 MG capsule Take 1 capsule (100 mg total) by mouth 3 (three) times daily. For anxiety, agitation, neurogenic pain. 11/03/12   Verne SpurrNeil Mashburn, PA-C  minocycline (MINOCIN,DYNACIN) 100 MG capsule Take 1 capsule (100 mg total) by mouth 2 (two) times daily. 10/26/13   Rodolph BongEvan S Corey, MD   BP 148/78  Pulse 76  Temp(Src) 98.4 F (36.9 C) (Oral)  Resp 18  SpO2 100% Physical Exam  Nursing note and vitals reviewed. Constitutional: He is oriented to person, place, and time. He appears well-developed and well-nourished.  Non-toxic appearance. No distress.  HENT:  Head: Normocephalic and atraumatic.  Eyes: Conjunctivae, EOM and lids are normal. Pupils are equal, round, and reactive to light.  Neck: Normal range of motion. Neck supple. No tracheal deviation present. No mass present.  Cardiovascular: Normal rate, regular rhythm and normal heart sounds.  Exam reveals no gallop.   No murmur heard. Pulmonary/Chest: Effort normal and breath sounds normal. No stridor. No respiratory distress. He has no decreased breath sounds. He has no wheezes. He has no rhonchi. He has no rales.  Abdominal: Soft. Normal appearance and bowel sounds are normal. He exhibits no distension. There is no tenderness. There is no rebound and no CVA tenderness.  Musculoskeletal: Normal range of motion. He exhibits no edema and no tenderness.  Neurological: He is alert and oriented to person, place, and time. He has normal strength. No cranial nerve  deficit or sensory deficit. GCS eye subscore is 4. GCS verbal subscore is 5. GCS motor subscore is 6.  Skin: Skin is warm and dry. No abrasion and no rash noted.  Psychiatric: He has a normal mood and affect. His speech is normal and behavior is normal. He expresses homicidal ideation. He expresses no suicidal plans and no homicidal plans.    ED Course  Procedures (including critical care time) Labs Review Labs Reviewed - No data to display  Imaging  Review No results found.   EKG Interpretation None      MDM   Final diagnoses:  None    Patient to be given outpatient resources as well as prescription for nausea medication and medication for anxiety.    Toy BakerAnthony T Ahlijah Raia, MD 03/29/14 [phone]

## 2014-10-11 ENCOUNTER — Ambulatory Visit (HOSPITAL_COMMUNITY)
Admission: AD | Admit: 2014-10-11 | Discharge: 2014-10-11 | Disposition: A | Discharge status: Home / Self Care | Payer: Medicare Other | Attending: Psychiatry | Admitting: Psychiatry

## 2014-10-11 DIAGNOSIS — F1010 Alcohol abuse, uncomplicated: Secondary | ICD-10-CM | POA: Insufficient documentation

## 2014-10-11 DIAGNOSIS — Z814 Family history of other substance abuse and dependence: Secondary | ICD-10-CM | POA: Insufficient documentation

## 2014-10-11 DIAGNOSIS — Z590 Homelessness: Secondary | ICD-10-CM | POA: Insufficient documentation

## 2014-10-11 DIAGNOSIS — F1410 Cocaine abuse, uncomplicated: Secondary | ICD-10-CM | POA: Diagnosis present

## 2014-10-11 DIAGNOSIS — Z87891 Personal history of nicotine dependence: Secondary | ICD-10-CM | POA: Diagnosis not present

## 2014-10-11 DIAGNOSIS — Z818 Family history of other mental and behavioral disorders: Secondary | ICD-10-CM | POA: Insufficient documentation

## 2014-10-11 DIAGNOSIS — F319 Bipolar disorder, unspecified: Secondary | ICD-10-CM | POA: Insufficient documentation
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
## 2014-11-18 ENCOUNTER — Encounter (HOSPITAL_BASED_OUTPATIENT_CLINIC_OR_DEPARTMENT_OTHER): Payer: Self-pay | Admitting: *Deleted

## 2014-11-18 ENCOUNTER — Emergency Department (HOSPITAL_BASED_OUTPATIENT_CLINIC_OR_DEPARTMENT_OTHER)
Admission: EM | Admit: 2014-11-18 | Discharge: 2014-11-18 | Disposition: A | Discharge status: Home / Self Care | Payer: Medicare Other | Attending: Emergency Medicine | Admitting: Emergency Medicine

## 2014-11-18 DIAGNOSIS — F319 Bipolar disorder, unspecified: Secondary | ICD-10-CM | POA: Insufficient documentation

## 2014-11-18 DIAGNOSIS — Z720 Tobacco use: Secondary | ICD-10-CM | POA: Diagnosis not present

## 2014-11-18 DIAGNOSIS — Z79899 Other long term (current) drug therapy: Secondary | ICD-10-CM | POA: Insufficient documentation

## 2014-11-18 DIAGNOSIS — M545 Low back pain, unspecified: Secondary | ICD-10-CM

## 2014-11-18 DIAGNOSIS — Z792 Long term (current) use of antibiotics: Secondary | ICD-10-CM | POA: Diagnosis not present

## 2014-11-18 DIAGNOSIS — R350 Frequency of micturition: Secondary | ICD-10-CM | POA: Diagnosis not present

## 2014-11-18 LAB — URINALYSIS, ROUTINE W REFLEX MICROSCOPIC
BILIRUBIN URINE: NEGATIVE
Glucose, UA: NEGATIVE mg/dL
Hgb urine dipstick: NEGATIVE
KETONES UR: NEGATIVE mg/dL
Leukocytes, UA: NEGATIVE
NITRITE: NEGATIVE
Protein, ur: NEGATIVE mg/dL
SPECIFIC GRAVITY, URINE: 1.023 (ref 1.005–1.030)
UROBILINOGEN UA: 1 mg/dL (ref 0.0–1.0)
pH: 7.5 (ref 5.0–8.0)

## 2014-11-18 MED ORDER — IBUPROFEN 400 MG PO TABS
600.0000 mg | ORAL_TABLET | Freq: Once | ORAL | Status: AC
  Administered 2014-11-18: 600 mg via ORAL
  Filled 2014-11-18 (×2): qty 1

## 2014-11-18 MED ORDER — CYCLOBENZAPRINE HCL 10 MG PO TABS: 10.0000 mg | ORAL_TABLET | Freq: Three times a day (TID) | ORAL | Status: DC | PRN

## 2014-11-18 MED ORDER — IBUPROFEN 600 MG PO TABS: 600.0000 mg | ORAL_TABLET | Freq: Four times a day (QID) | ORAL | Status: DC | PRN

## 2014-11-18 MED ORDER — KETOROLAC TROMETHAMINE 30 MG/ML IJ SOLN
30.0000 mg | Freq: Once | INTRAMUSCULAR | Status: DC
  Filled 2014-11-18: qty 1

## 2014-11-18 NOTE — ED Notes (Signed)
rx x 2 given for flexeril and ibuprofen- pt d/c back to [facility]

## 2014-11-18 NOTE — ED Notes (Signed)
MD at bedside. 

## 2014-11-18 NOTE — ED Notes (Signed)
Pt from [facility]- in treatment for cocaine abuse- reports left side back pain x 1 week now radiating to left lower abdomen- last BM today was normal per pt report

## 2014-11-18 NOTE — ED Provider Notes (Signed)
CSN: [id number]     Arrival date & time 11/18/14  1038 History   First MD Initiated Contact with Patient 11/18/14 1054     Chief Complaint  Patient presents with  . Back Pain     (Consider location/radiation/quality/duration/timing/severity/associated sxs/prior Treatment) Patient is a 34 y.o. male presenting with back pain. The history is provided by the patient.  Back Pain Location:  Lumbar spine Quality:  Stabbing Radiates to:  L thigh Pain severity:  Mild Onset quality:  Gradual Timing:  Intermittent Worsened by:  Movement and ambulation Associated symptoms: abdominal pain   Associated symptoms: no chest pain, no dysuria, no fever, no numbness and no weakness     [name] is a 34 yo M [facility]. The back pain has been there for about a week now. It is described as dull, sharp, achy and a 5 out of 10. The thing that brought him in today is that it was much sharper last night than usual. It is also wrapping around to his front and radiating down to his hip. He has taken Advil and Tylenol with mild improvement. Turning or movement makes the pain worse. He has had increased urinary frequency, abdominal pain in the left lower quadrant. He denies any dysuria, changes in bowel movement, fever, chills, night sweats, chest pain, shortness of breath, weakness, numbness, or tingling.  Past Medical History  Diagnosis Date  . Bipolar 1 disorder    Past Surgical History  Procedure Laterality Date  . Back surgery  2011    had melanoma mole removed   Family History  Problem Relation Age of Onset  . Heart Problems Father    History  Substance Use Topics  . Smoking status: Current Every Day Smoker -- 0.50 packs/day for 5 years    Types: Cigarettes    Last Attempt to Quit: 06/04/2013  . Smokeless tobacco: Never Used  . Alcohol Use: Yes     Comment: occasionally    Review of Systems  Constitutional: Negative for fever and chills.    Respiratory: Negative for cough.   Cardiovascular: Negative for chest pain.  Gastrointestinal: Positive for abdominal pain. Negative for nausea, vomiting, diarrhea and constipation.  Genitourinary: Positive for frequency. Negative for dysuria.  Musculoskeletal: Positive for back pain.  Skin: Negative for color change and rash.  Neurological: Negative for weakness and numbness.      Allergies  Review of patient's allergies indicates no known allergies.  Home Medications   Prior to Admission medications   Medication Sig Start Date End Date Taking? Authorizing Provider  divalproex (DEPAKOTE) 500 MG DR tablet Take 1 tablet (500 mg total) by mouth daily. For mood stabilization and prevention of seizures. 11/03/12  Yes Lloyd Huger T Mashburn, PA-C  hydrOXYzine (ATARAX/VISTARIL) 25 MG tablet Take 25 mg by mouth 3 (three) times daily.   Yes Historical Provider, MD  sertraline (ZOLOFT) 50 MG tablet Take 50 mg by mouth daily.   Yes Historical Provider, MD  albuterol (PROVENTIL HFA;VENTOLIN HFA) 108 (90 BASE) MCG/ACT inhaler Inhale 2 puffs into the lungs every 6 (six) hours as needed for wheezing.    Historical Provider, MD  ALPRAZolam Prudy Feeler) 0.25 MG tablet Take 1 tablet (0.25 mg total) by mouth 3 (three) times daily as needed for anxiety. 03/29/14   Lorre Nick, MD  citalopram (CELEXA) 10 MG tablet Take 10 mg by mouth daily.    Historical Provider, MD  cyclobenzaprine (FLEXERIL) 10 MG tablet Take 1 tablet (  10 mg total) by mouth 3 (three) times daily as needed for muscle spasms. 11/18/14   Myra Rude, MD  gabapentin (NEURONTIN) 100 MG capsule Take 1 capsule (100 mg total) by mouth 3 (three) times daily. For anxiety, agitation, neurogenic pain. 11/03/12   Tamala Julian, PA-C  minocycline (MINOCIN,DYNACIN) 100 MG capsule Take 1 capsule (100 mg total) by mouth 2 (two) times daily. 10/26/13   Rodolph Bong, MD  promethazine (PHENERGAN) 12.5 MG tablet Take 1 tablet (12.5 mg total) by mouth every 6 (six)  hours as needed for nausea or vomiting. 03/29/14   Lorre Nick, MD   BP 150/50 mmHg  Pulse 65  Temp(Src) 98.3 F (36.8 C) (Oral)  Resp 18  Ht  (1.803 m)  Wt 180 lb (81.647 kg)  BMI 25.12 kg/m2  SpO2 98% Physical Exam  Constitutional: He is oriented to person, place, and time. He appears well-developed and well-nourished.  HENT:  Head: Normocephalic and atraumatic.  Eyes: Conjunctivae and EOM are normal.  Neck: Normal range of motion.  Cardiovascular: Normal rate, regular rhythm, normal heart sounds and intact distal pulses.   Pulmonary/Chest: Effort normal and breath sounds normal. He has no wheezes. He has no rales.  Abdominal: Soft. Bowel sounds are normal. He exhibits no distension. There is no tenderness. There is no rebound.  Musculoskeletal: Normal range of motion.       Back:  Normal heel walking Normal ambulation on tiptoes Normal gait Pulses intact distally +3 DTR patellar bilaterally Normal plantar and dorsiflexion Back: Symmetric, no erythema or ecchymosis Tender to palpation over left lumbar region proximal to the iliac crest.  No spinal tenderness Negative straight legs bilaterally 5 out of 5 strength in lower extremities bilaterally Normal sensation in bilaterally lower extremities  HIP: Normal internal and external hip rotation Neg FABER and FADIR   Neurological: He is alert and oriented to person, place, and time.  Skin: Skin is warm. No rash noted.    ED Course  Procedures (including critical care time) Labs Review Labs Reviewed  URINALYSIS, ROUTINE W REFLEX MICROSCOPIC (NOT AT [facility])    Imaging Review No results found.   EKG Interpretation None      Medications  ibuprofen (ADVIL,MOTRIN) tablet 600 mg (600 mg Oral Given 11/18/14 1126)    MDM   Final diagnoses:  Left-sided low back pain without sciatica   [name] is a 34 yo M that with back pain that is most likely a muscle strain. UA is neg for all. Exam is reassuring. Will  advise ibuprofen. Give flexeril and home modalities. Patient agreeable with plan and discharge.    Myra Rude, MD PGY-2, [facility] 11/18/2014, 12:23 PM      Myra Rude, MD 11/18/14 1223  Gwyneth Sprout, MD 11/18/14 1500

## 2014-11-18 NOTE — ED Notes (Signed)
Dr Anitra Lauth at bedside to eval pt with bedside US

## 2014-11-18 NOTE — Discharge Instructions (Signed)
Back Pain, Adult Low back pain is very common. About 1 in 5 people have back pain.The cause of low back pain is rarely dangerous. The pain often gets better over time.About half of people with a sudden onset of back pain feel better in just 2 weeks. About 8 in 10 people feel better by 6 weeks.  CAUSES Some common causes of back pain include:  Strain of the muscles or ligaments supporting the spine.  Wear and tear (degeneration) of the spinal discs.  Arthritis.  Direct injury to the back. DIAGNOSIS Most of the time, the direct cause of low back pain is not known.However, back pain can be treated effectively even when the exact cause of the pain is unknown.Answering your caregiver's questions about your overall health and symptoms is one of the most accurate ways to make sure the cause of your pain is not dangerous. If your caregiver needs more information, he or she may order lab work or imaging tests (X-rays or MRIs).However, even if imaging tests show changes in your back, this usually does not require surgery. HOME CARE INSTRUCTIONS For many people, back pain returns.Since low back pain is rarely dangerous, it is often a condition that people can learn to manageon their own.   Remain active. It is stressful on the back to sit or stand in one place. Do not sit, drive, or stand in one place for more than 30 minutes at a time. Take short walks on level surfaces as soon as pain allows.Try to increase the length of time you walk each day.  Do not stay in bed.Resting more than 1 or 2 days can delay your recovery.  Do not avoid exercise or work.Your body is made to move.It is not dangerous to be active, even though your back may hurt.Your back will likely heal faster if you return to being active before your pain is gone.  Pay attention to your body when you bend and lift. Many people have less discomfortwhen lifting if they bend their knees, keep the load close to their bodies,and  avoid twisting. Often, the most comfortable positions are those that put less stress on your recovering back.  Find a comfortable position to sleep. Use a firm mattress and lie on your side with your knees slightly bent. If you lie on your back, put a pillow under your knees.  Only take over-the-counter or prescription medicines as directed by your caregiver. Over-the-counter medicines to reduce pain and inflammation are often the most helpful.Your caregiver may prescribe muscle relaxant drugs.These medicines help dull your pain so you can more quickly return to your normal activities and healthy exercise.  Put ice on the injured area.  Put ice in a plastic bag.  Place a towel between your skin and the bag.  Leave the ice on for 15-20 minutes, 03-04 times a day for the first 2 to 3 days. After that, ice and heat may be alternated to reduce pain and spasms.  Ask your caregiver about trying back exercises and gentle massage. This may be of some benefit.  Avoid feeling anxious or stressed.Stress increases muscle tension and can worsen back pain.It is important to recognize when you are anxious or stressed and learn ways to manage it.Exercise is a great option. SEEK MEDICAL CARE IF:  You have pain that is not relieved with rest or medicine.  You have pain that does not improve in 1 week.  You have new symptoms.  You are generally not feeling well. SEEK   IMMEDIATE MEDICAL CARE IF:   You have pain that radiates from your back into your legs.  You develop new bowel or bladder control problems.  You have unusual weakness or numbness in your arms or legs.  You develop nausea or vomiting.  You develop abdominal pain.  You feel faint. Document Released: 05/21/2005 Document Revised: 11/20/2011 Document Reviewed: 09/22/2013 [employer] Patient Information 2015 [employer]. This information is not intended to replace advice given to you by your health care provider. Make sure you  discuss any questions you have with your health care provider.  

## 2014-12-26 ENCOUNTER — Emergency Department (HOSPITAL_COMMUNITY)
Admission: EM | Admit: 2014-12-26 | Discharge: 2014-12-26 | Discharge status: Against Medical Advice | Payer: Medicare Other

## 2014-12-26 NOTE — ED Notes (Signed)
No answer when called for room a third and final time.

## 2014-12-26 NOTE — ED Notes (Signed)
No call x one when called for triage room.

## 2014-12-26 NOTE — ED Notes (Signed)
Pt did not answer x 2 when called for triage.

## 2015-01-12 ENCOUNTER — Encounter (HOSPITAL_COMMUNITY): Payer: Self-pay | Admitting: Emergency Medicine

## 2015-01-12 ENCOUNTER — Emergency Department (HOSPITAL_COMMUNITY)
Admission: EM | Admit: 2015-01-12 | Discharge: 2015-01-13 | Disposition: A | Discharge status: Home / Self Care | Payer: Medicare Other | Attending: Emergency Medicine | Admitting: Emergency Medicine

## 2015-01-12 DIAGNOSIS — Z720 Tobacco use: Secondary | ICD-10-CM | POA: Insufficient documentation

## 2015-01-12 DIAGNOSIS — R45851 Suicidal ideations: Secondary | ICD-10-CM

## 2015-01-12 DIAGNOSIS — F319 Bipolar disorder, unspecified: Secondary | ICD-10-CM | POA: Insufficient documentation

## 2015-01-12 DIAGNOSIS — Z79899 Other long term (current) drug therapy: Secondary | ICD-10-CM | POA: Insufficient documentation

## 2015-01-12 DIAGNOSIS — F1410 Cocaine abuse, uncomplicated: Secondary | ICD-10-CM | POA: Insufficient documentation

## 2015-01-12 DIAGNOSIS — R011 Cardiac murmur, unspecified: Secondary | ICD-10-CM | POA: Insufficient documentation

## 2015-01-12 DIAGNOSIS — F329 Major depressive disorder, single episode, unspecified: Secondary | ICD-10-CM

## 2015-01-12 DIAGNOSIS — F32A Depression, unspecified: Secondary | ICD-10-CM

## 2015-01-12 DIAGNOSIS — F1910 Other psychoactive substance abuse, uncomplicated: Secondary | ICD-10-CM

## 2015-01-12 HISTORY — DX: Cardiac murmur, unspecified: R01.1

## 2015-01-12 LAB — SALICYLATE LEVEL

## 2015-01-12 LAB — CBC
HEMATOCRIT: 45.2 % (ref 39.0–52.0)
Hemoglobin: 15.2 g/dL (ref 13.0–17.0)
MCH: 30.7 pg (ref 26.0–34.0)
MCHC: 33.6 g/dL (ref 30.0–36.0)
MCV: 91.3 fL (ref 78.0–100.0)
Platelets: 266 10*3/uL (ref 150–400)
RBC: 4.95 MIL/uL (ref 4.22–5.81)
RDW: 13.6 % (ref 11.5–15.5)
WBC: 11.8 10*3/uL — AB (ref 4.0–10.5)

## 2015-01-12 LAB — COMPREHENSIVE METABOLIC PANEL
ALBUMIN: 4.9 g/dL (ref 3.5–5.0)
ALK PHOS: 70 U/L (ref 38–126)
ALT: 22 U/L (ref 17–63)
AST: 22 U/L (ref 15–41)
Anion gap: 11 (ref 5–15)
BUN: 8 mg/dL (ref 6–20)
CO2: 26 mmol/L (ref 22–32)
Calcium: 9.5 mg/dL (ref 8.9–10.3)
Chloride: 102 mmol/L (ref 101–111)
Creatinine, Ser: 1.15 mg/dL (ref 0.61–1.24)
GFR calc Af Amer: >60 mL/min (ref >60)
GFR calc non Af Amer: >60 mL/min (ref >60)
Glucose, Bld: 99 mg/dL (ref 65–99)
Potassium: 3.1 mmol/L — ABNORMAL LOW (ref 3.5–5.1)
SODIUM: 139 mmol/L (ref 135–145)
TOTAL PROTEIN: 7.4 g/dL (ref 6.5–8.1)
Total Bilirubin: 1.4 mg/dL — ABNORMAL HIGH (ref 0.3–1.2)

## 2015-01-12 LAB — ACETAMINOPHEN LEVEL

## 2015-01-12 LAB — RAPID URINE DRUG SCREEN, HOSP PERFORMED
AMPHETAMINES: NOT DETECTED
BARBITURATES: NOT DETECTED
BENZODIAZEPINES: NOT DETECTED
Cocaine: POSITIVE — AB
Opiates: NOT DETECTED
TETRAHYDROCANNABINOL: NOT DETECTED

## 2015-01-12 LAB — ETHANOL: Alcohol, Ethyl (B): <5 mg/dL (ref <5)

## 2015-01-12 MED ORDER — HYDROXYZINE HCL 25 MG PO TABS: 25.0000 mg | ORAL_TABLET | Freq: Four times a day (QID) | ORAL | Status: DC | PRN

## 2015-01-12 MED ORDER — DICYCLOMINE HCL 20 MG PO TABS: 20.0000 mg | ORAL_TABLET | Freq: Four times a day (QID) | ORAL | Status: DC | PRN

## 2015-01-12 MED ORDER — NAPROXEN 500 MG PO TABS: 500.0000 mg | ORAL_TABLET | Freq: Two times a day (BID) | ORAL | Status: DC | PRN

## 2015-01-12 MED ORDER — LOPERAMIDE HCL 2 MG PO CAPS: 2.0000 mg | ORAL_CAPSULE | ORAL | Status: DC | PRN

## 2015-01-12 MED ORDER — METHOCARBAMOL 500 MG PO TABS: 500.0000 mg | ORAL_TABLET | Freq: Three times a day (TID) | ORAL | Status: DC | PRN

## 2015-01-12 MED ORDER — ONDANSETRON 4 MG PO TBDP: 4.0000 mg | ORAL_TABLET | Freq: Four times a day (QID) | ORAL | Status: DC | PRN

## 2015-01-12 NOTE — ED Provider Notes (Signed)
CSN: [id number]     Arrival date & time 01/12/15  1757 History   First MD Initiated Contact with Patient 01/12/15 1852     Chief Complaint  Patient presents with  . Suicidal     (Consider location/radiation/quality/duration/timing/severity/associated sxs/prior Treatment) HPI    PCP: Johny Blamer, MD Blood pressure 137/79, pulse 77, temperature 98 F (36.7 C), temperature source Oral, resp. rate 17, SpO2 98 %.  [name] is a 34 y.o.male with a significant PMH of bipolar 1 disorder and heart murmur presents to the ER with complaints of polysubstance abuse and suicidal ideation. He reports his medications are not working. He is coming down from a three-day drug binge. He reports using heroin, cocaine, ETOH, pills and anything he could get his hands on. Since coming down from his drug binge he is now suicidal and feels like running into traffic or hanging himself. He said he would shoot himself if he owned a gun. He also admits that he is not sure if he would do it or not. Denies hallucinations or delusions.   The patient denies diaphoresis, fever, headache, weakness (general or focal), confusion, change of vision,  neck pain, dysphagia, aphagia, chest pain, shortness of breath,  back pain, abdominal pains, nausea, vomiting, diarrhea, lower extremity swelling, rash.  Past Medical History  Diagnosis Date  . Bipolar 1 disorder   . Heart murmur    Past Surgical History  Procedure Laterality Date  . Back surgery  2011    had melanoma mole removed   Family History  Problem Relation Age of Onset  . Heart Problems Father    Social History  Substance Use Topics  . Smoking status: Current Every Day Smoker -- 0.50 packs/day for 5 years    Types: Cigarettes    Last Attempt to Quit: 06/04/2013  . Smokeless tobacco: Never Used  . Alcohol Use: Yes     Comment: occasionally    Review of Systems  10 Systems reviewed and are negative for acute change except as noted in the HPI.    Allergies  Review of patient's allergies indicates no known allergies.  Home Medications   Prior to Admission medications   Medication Sig Start Date End Date Taking? Authorizing Provider  albuterol (PROVENTIL HFA;VENTOLIN HFA) 108 (90 BASE) MCG/ACT inhaler Inhale 2 puffs into the lungs every 6 (six) hours as needed for wheezing.   Yes Historical Provider, MD  hydrOXYzine (ATARAX/VISTARIL) 25 MG tablet Take 25 mg by mouth 3 (three) times daily.   Yes Historical Provider, MD  lurasidone (LATUDA) 40 MG TABS tablet Take 40 mg by mouth at bedtime.   Yes Historical Provider, MD  cyclobenzaprine (FLEXERIL) 10 MG tablet Take 1 tablet (10 mg total) by mouth 3 (three) times daily as needed for muscle spasms. Patient not taking: Reported on 01/12/2015 11/18/14   Myra Rude, MD  divalproex (DEPAKOTE) 500 MG DR tablet Take 1 tablet (500 mg total) by mouth daily. For mood stabilization and prevention of seizures. Patient not taking: Reported on 01/12/2015 11/03/12   Tamala Julian, PA-C  gabapentin (NEURONTIN) 100 MG capsule Take 1 capsule (100 mg total) by mouth 3 (three) times daily. For anxiety, agitation, neurogenic pain. Patient not taking: Reported on 01/12/2015 11/03/12   Tamala Julian, PA-C  ibuprofen (ADVIL,MOTRIN) 600 MG tablet Take 1 tablet (600 mg total) by mouth every 6 (six) hours as needed. Patient not taking: Reported on 01/12/2015 11/18/14   Myra Rude, MD  minocycline [place])  100 MG capsule Take 1 capsule (100 mg total) by mouth 2 (two) times daily. Patient not taking: Reported on 01/12/2015 10/26/13   Rodolph Bong, MD   BP 137/79 mmHg  Pulse 77  Temp(Src) 98 F (36.7 C) (Oral)  Resp 17  SpO2 98% Physical Exam  Constitutional: He appears well-developed and well-nourished. No distress.  HENT:  Head: Normocephalic and atraumatic.  Eyes: Pupils are equal, round, and reactive to light.  Neck: Normal range of motion. Neck supple.  Cardiovascular: Normal rate and  regular rhythm.   Pulmonary/Chest: Effort normal.  Abdominal: Soft.  Neurological: He is alert.  Tremulous   Skin: Skin is warm and dry.  Psychiatric: He exhibits a depressed mood.  Nursing note and vitals reviewed.   ED Course  Procedures (including critical care time) Labs Review Labs Reviewed  COMPREHENSIVE METABOLIC PANEL - Abnormal; Notable for the following:    Potassium 3.1 (*)    Total Bilirubin 1.4 (*)    All other components within normal limits  ACETAMINOPHEN LEVEL - Abnormal; Notable for the following:    Acetaminophen (Tylenol), Serum <10 (*)    All other components within normal limits  CBC - Abnormal; Notable for the following:    WBC 11.8 (*)    All other components within normal limits  URINE RAPID DRUG SCREEN, HOSP PERFORMED - Abnormal; Notable for the following:    Cocaine POSITIVE (*)    All other components within normal limits  ETHANOL  SALICYLATE LEVEL    Imaging Review No results found.   EKG Interpretation None      MDM   Final diagnoses:  Polysubstance abuse  Suicidal ideation  Depression    TTS consult ordered Cocaine positive Medically cleared at this time. Home meds reviewed Holding orders placed.  Filed Vitals:   01/12/15 1816  BP: 137/79  Pulse: 77  Temp: 98 F (36.7 C)  Resp: [address] PA-C 01/12/15 2106  Melene Plan, DO 01/12/15 2222

## 2015-01-12 NOTE — ED Notes (Signed)
Pt states that he has been having thoughts of SI that started today. Pt states that he has lots of ways "like running out in traffic, hanging myself, i dont own a gun so i cant shoot myself".  Pt also states that he wants detox from "everything", opioids, ETOH. Pt states that he last use was today.

## 2015-01-12 NOTE — ED Notes (Signed)
Bed: WA30 Expected date:  Expected time:  Means of arrival:  Comments: Triage 2 

## 2015-01-13 DIAGNOSIS — F319 Bipolar disorder, unspecified: Secondary | ICD-10-CM | POA: Diagnosis not present

## 2015-01-13 MED ORDER — DIVALPROEX SODIUM 250 MG PO DR TAB: 250.0000 mg | DELAYED_RELEASE_TABLET | Freq: Every day | ORAL | Status: DC

## 2015-01-13 NOTE — ED Provider Notes (Signed)
Patient with history of polysubstance abuse here with suicidal ideations when abusing cocaine and alcohol. Patient is sober on evaluation in the emergency department and denies any active SI. He states that his symptoms worsened after he stopped his Depakote a week ago. Will restart his Depakote with close outpatient follow-up with [facility] and [facility]. Patient will contract for safety.. Return precautions were discussed.  Tilden Fossa, MD 01/13/15 [phone]

## 2015-01-13 NOTE — Discharge Instructions (Signed)
Polysubstance Abuse When people abuse more than one drug or type of drug it is called polysubstance or polydrug abuse. For example, many smokers also drink alcohol. This is one form of polydrug abuse. Polydrug abuse also refers to the use of a drug to counteract an unpleasant effect produced by another drug. It may also be used to help with withdrawal from another drug. People who take stimulants may become agitated. Sometimes this agitation is countered with a tranquilizer. This helps protect against the unpleasant side effects. Polydrug abuse also refers to the use of different drugs at the same time.  Anytime drug use is interfering with normal living activities, it has become abuse. This includes problems with family and friends. Psychological dependence has developed when your mind tells you that the drug is needed. This is usually followed by physical dependence which has developed when continuing increases of drug are required to get the same feeling or "high". This is known as addiction or chemical dependency. A person's risk is much higher if there is a history of chemical dependency in the family. SIGNS OF CHEMICAL DEPENDENCY  You have been told by friends or family that drugs have become a problem.  You fight when using drugs.  You are having blackouts (not remembering what you do while using).  You feel sick from using drugs but continue using.  You lie about use or amounts of drugs (chemicals) used.  You need chemicals to get you going.  You are suffering in work performance or in school because of drug use.  You get sick from use of drugs but continue to use anyway.  You need drugs to relate to people or feel comfortable in social situations.  You use drugs to forget problems. "Yes" answered to any of the above signs of chemical dependency indicates there are problems. The longer the use of drugs continues, the greater the problems will become. If there is a family history of  drug or alcohol use, it is best not to experiment with these drugs. Continual use leads to tolerance. After tolerance develops more of the drug is needed to get the same feeling. This is followed by addiction. With addiction, drugs become the most important part of life. It becomes more important to take drugs than participate in the other usual activities of life. This includes relating to friends and family. Addiction is followed by dependency. Dependency is a condition where drugs are now needed not just to get high, but to feel normal. Addiction cannot be cured but it can be stopped. This often requires outside help and the care of professionals. Treatment centers are listed in the yellow pages under: Cocaine, Narcotics, and Alcoholics Anonymous. Most hospitals and clinics can refer you to a specialized care center. Talk to your caregiver if you need help. Document Released: 01/10/2005 Document Revised: 08/13/2011 Document Reviewed: 05/21/2005 [facility] Patient Information 2015 [place], [place]. This information is not intended to replace advice given to you by your health care provider. Make sure you discuss any questions you have with your health care provider.  Suicidal Feelings, How to Help Yourself Everyone feels sad or unhappy at times, but depressing thoughts and feelings of hopelessness can lead to thoughts of suicide. It can seem as if life is too tough to handle. If you feel as though you have reached the point where suicide is the only answer, it is time to let someone know immediately.  HOW TO COPE AND PREVENT SUICIDE  Let family, friends, teachers,  or counselors know. Get help. Try not to isolate yourself from those who care about you. Even though you may not feel sociable, talk with someone every day. It is best if it is face-to-face. Remember, they will want to help you.  Eat a regularly spaced and well-balanced diet.  Get plenty of rest.  Avoid alcohol and drugs because they will  only make you feel worse and may also lower your inhibitions. Remove them from the home. If you are thinking of taking an overdose of your prescribed medicines, give your medicines to someone who can give them to you one day at a time. If you are on antidepressants, let your caregiver know of your feelings so he or she can provide a safer medicine, if that is a concern.  Remove weapons or poisons from your home.  Try to stick to routines. Follow a schedule and remind yourself that you have to keep that schedule every day.  Set some realistic goals and achieve them. Make a list and cross things off as you go. Accomplishments give a sense of worth. Wait until you are feeling better before doing things you find difficult or unpleasant to do.  If you are able, try to start exercising. Even half-hour periods of exercise each day will make you feel better. Getting out in the sun or into nature helps you recover from depression faster. If you have a favorite place to walk, take advantage of that.  Increase safe activities that have always given you pleasure. This may include playing your favorite music, reading a good book, painting a picture, or playing your favorite instrument. Do whatever takes your mind off your depression.  Keep your living space well-lighted. GET HELP Contact a suicide hotline, crisis center, or local suicide prevention center for help right away. Local centers may include a hospital, clinic, community service organization, social service provider, or health department.  Call your local emergency services (911 in the [place]).  Call a suicide hotline:  1-800-273-TALK (719-186-8961) in the [place].  1-800-SUICIDE (618)355-6505) in the [place].  714-475-5856 in the [place] for Spanish-speaking counselors.  4-696-295-2WUX 970 129 8833) in the [place] for TTY users.  Visit the following websites for information and help:  National  Suicide Prevention Lifeline: www.suicidepreventionlifeline.org  Hopeline: www.hopeline.com  [employer] for Suicide Prevention: https://www.ayers.com/  For lesbian, gay, bisexual, transgender, or questioning youth, contact [employer]:  3-664-4-I-HKVQQV 737 414 2119) in the [place].  www.thetrevorproject.org  In [place], treatment resources are listed in each province with listings available under [employer] for [employer] or similar titles. Another source for Crisis Centres by [place] is located at http://www.suicideprevention.ca/in-crisis-now/find-a-crisis-centre-now/crisis-centres Document Released: 11/25/2002 Document Revised: 08/13/2011 Document Reviewed: 09/15/2013 [facility] Patient Information 2015 [place], [place]. This information is not intended to replace advice given to you by your health care provider. Make sure you discuss any questions you have with your health care provider.  Emergency Department Resource Guide 1) Find a Doctor and Pay Out of Pocket Although you won't have to find out who is covered by your insurance plan, it is a good idea to ask around and get recommendations. You will then need to call the office and see if the doctor you have chosen will accept you as a new patient and what types of options they offer for patients who are self-pay. Some doctors offer discounts or will set up payment plans for their patients who do not have insurance, but you will need to ask so you  aren't surprised when you get to your appointment.  2) Contact Your Local Health Department Not all health departments have doctors that can see patients for sick visits, but many do, so it is worth a call to see if yours does. If you don't know where your local health department is, you can check in your phone book. The CDC also has a tool to help you locate your state's health department, and many state websites also have listings of all of their local health departments.  3)  Find a Walk-in Clinic If your illness is not likely to be very severe or complicated, you may want to try a walk in clinic. These are popping up all over the country in pharmacies, drugstores, and shopping centers. They're usually staffed by nurse practitioners or physician assistants that have been trained to treat common illnesses and complaints. They're usually fairly quick and inexpensive. However, if you have serious medical issues or chronic medical problems, these are probably not your best option.  No Primary Care Doctor: - Call Health Connect at  [phone] - they can help you locate a primary care doctor that  accepts your insurance, provides certain services, etc. - Physician Referral Service- [phone]  Chronic Pain Problems: Organization         Address  Phone   Notes  [facility]  [phone] Patients need to be referred by their primary care doctor.   Medication Assistance: Organization         Address  Phone   Notes  [facility] Medication [facility] [address] [place], [place] [address] [phone] --Must be a resident of [facility] -- Must have NO insurance coverage whatsoever (no Medicaid/ Medicare, etc.) -- The pt. MUST have a primary care doctor that directs their care regularly and follows them in the community   [facility]  [phone]   [employer]  [phone]    Agencies that provide inexpensive medical care: Organization         Address  Phone   Notes  [facility]  [phone]   [facility]    [phone]   [facility] [address] [place], [place] [address] [phone]   [facility] [address] [place], [place] [phone]   [facility]anned Parenthood    [phone]   [facility]    [phone]   [facility]  [address], [place] Phone:  [phone], Fax:  [phone] Hours of Operation:  9 am - 6 pm, M-F.  Also accepts Medicaid/Medicare and self-pay.  [facility]  [address], [place] Phone: [phone], Fax: [phone]. Hours of Operation:  8:30 am - 5:30 pm, M-F.  Also accepts Medicaid and self-pay.  [facility] [place] [address] [place] Phone: [phone]   [facility] [address] Natasha Bence [place], [place] [phone], Ext. [phone] Mondays & Thursdays: 7-9 AM.  First 15 patients are seen on a first come, first serve basis.    Medicaid-accepting [facility] Providers:  Organization         Address  Phone   Notes  [facility] [address], [place] [phone] Also accepts self-pay patients.  [facility] (1-Rh) [address] Laurell Josephs [place], [place]  [phone]  [facility] [address], [place] ([phone]) [phone]   °[facility] [address] [place] [address], [place] ([phone]) [phone]   °Veita Bland [address], [place]  ° ([phone]) [phone] Only accepts [place] Access Medicaid patients after they have their name applied to their card.  ° °Self-Pay (no insurance) in [place]: ° °Organization         Address  Phone   Notes  °Sickle Cell Patients, [facility] [address], [place] ([phone]) [phone]   °[facility] [address], [place] (336) [phone]   °[facility] [place] ° [address], [place] ([phone]) [phone]   °[facility]/Dr. Osei-Bonsu ° [address] [place] [address], [place] or [address], [place] ([phone]) [phone] Phone number for both [place] and [place] locations is the same.  °[facility] [address], [place] ([phone]) [phone]   °[facility] [address] [place] [address], [place] or [address] (336) [phone] °(336) [phone]     °[facility] [address], [place] (336) [phone], phone; ([phone]) [phone], fax Sees patients 1st and 3rd Saturday of every month.  Must not qualify for public or private insurance (i.e. Medicaid, Medicare, [place] Health Choice, Veterans' Benefits) • Household income should be no more than 200% of the poverty level •The clinic cannot treat you if you are pregnant or think you are pregnant • Sexually transmitted diseases are not treated at the clinic.  ° ° °Dental Care: °Organization         Address  Phone  Notes  °[place] [facility] [address],  ([phone]) [phone] Accepts children up to age 21 who are enrolled in Medicaid or [place] Health Choice; pregnant women with a Medicaid card; and children who have applied for Medicaid or [place] Health Choice, but were declined, whose parents can pay a reduced fee at time of service.  °[place] [facility] [place]  [address], [place] (336) [phone] Accepts children up to age 21 who are enrolled in Medicaid or  Health Choice; pregnant women with a Medicaid card; and children who have applied for Medicaid or  Health Choice, but were declined, whose parents can pay a reduced fee at time of service.  °[facility] ° [address],  ([phone]) [phone] Patients are seen by appointment only. Walk-ins are not accepted. [facility] will see patients 18 years of age and older. °Monday - Tuesday (8am-5pm) °Most Wednesdays (8:30-5pm) °$30 per visit, cash only  °[facility] ° [address], [place] ([phone]) [phone] Patients are seen by appointment only. Walk-ins are not accepted. [facility] will see patients 18 years of age and older. °One Wednesday Evening (Monthly: Volunteer Based).  $30 per visit, cash only  °[facility]  ([phone]) [phone] for adults; Children under age 4, call  Graduate Pediatric Dentistry at [phone]. Children aged 4-14, please call [phone] to request a pediatric application. ° Dental services are provided in all areas of dental care including fillings, crowns and bridges, complete and partial dentures, implants, gum treatment, root canals, and extractions. Preventive care is also provided. Treatment is provided to both adults and children. °Patients are selected via a lottery and there is often a waiting   list. °  °[facility] [address], °[place] ° (336) 763-8833 www.drcivils.com °  °[facility] [address], [place], [place] (336)723-1848, Ext. [phone] Second and Fourth Thursday of each month, opens at 6:30 AM; Clinic ends at 9 AM.  Patients are seen on a first-come first-served basis, and a limited number are seen during each clinic.  ° °[facility] ° [address], [place], [place] (336) [phone]   Eligibility Requirements °You must have lived in [place], [place], or [place] counties for at least the last three months. °  You cannot be eligible for state or federal sponsored healthcare insurance, including Veterans Administration, Medicaid, or Medicare. °  You generally cannot be eligible for healthcare insurance through your employer.  °  How to apply: °Eligibility screenings are held every Tuesday and Wednesday afternoon from 1:00 pm until 4:00 pm. You do not need an appointment for the interview!  °[facility] [address], [place], [place] 336-631-2330   °[facility]  336-342-8273   °[facility]  336-703-3100   °[facility]  336-570-6415   ° °Behavioral Health Resources in the Community: °Intensive Outpatient Programs °Organization         Address  Phone  Notes  °[facility] [address], [place], [place] 336-878-6098   °[facility] [address], [place], [place]  336-832-9800   °[facility] [address], [place], [place] ° 336-882-2125   °[facility] [address],  °[place], [place] 1-800-853-5163 or 336-641-4981   °Substance Abuse Resources °Organization         Address  Phone  Notes  °[facility]  336-882-2125   °[facility]  336-784-9470   °[facility]  336-285-9073   °[facility]  336-845-3988   °Residential & Outpatient Substance Abuse Program  1-800-659-3381   °Psychological Services °Organization         Address  Phone  Notes  °[facility]  336- 832-9600   °[facility]  336- 378-7881   °[facility] [address], [place] 1-800-853-5163 or 336-641-4981   ° °Mobile Crisis Teams °Organization         Address  Phone  Notes  °[facility], Mobile Crisis Care Unit  1-877-626-1772   °Assertive °[facility] ° [address] [place], [place] 336-834-9664   °Sharon DeEsch [address] °[place] 336-554-5454   ° °Self-Help/Support Groups °Organization         Address  Phone             Notes  °[facility] [place] - variety of support groups  336- 373-1402 Call for more information  °Narcotics Anonymous (NA), [facility] [address], °[place]  2 meetings at this location  ° °Residential Treatment Programs °Organization         Address  Phone  Notes  °[facility] [address],    °[place]  1-866-801-8205   °[facility] ° [address], [place], [place] 704-293-8524   °[facility] [address], [place] 336-845-3988 Admissions: 8am-3pm M-F  °[facility] [address]    °[place], [place] 336-841-1104   °[facility] [address], [place], [place] 336-379-7146   °[facility]   [address]  °[place], [place] 336-285-9073   °[facility] - Intensive Outpatient [address], [place], [place] 336-852-3033   °[facility] [address]  °[place], [place] 1-877-615-2722 or 336-784-9470   °[facility] ([facility]) [address] [place], [place] 336-227-7417 Accepts Medicaid  °[facility] [address]  °[place] 1-800-659-3381 Substance Abuse/Addiction Treatment  ° °[place] [facility] Resources °Organization         Address  Phone  Notes  °[facility]  (888) 581-9988   °Julie Brannon, PhD [address] [place], [place]   (336) 349-5553 or (336) 951-0000   °[facility]   [address] °[place], [place] (336) 349-4454   °[facility] [address], [place], [place] (336) 342-8316 Insurance/Medicaid/sponsorship through [facility]  °[facility] [address]                                    [place], [place] (336) 342-8316 Therapy/tele-psych/case  °[facility] [address]  ° [place], [place] (336) 349-2233    °Dr. Arfeen  (336) 349-4544   °[facility] [place]  [employer] [place] [facility] 1) [address], [place] °2) [address], [place] °3)  [address], [place] (336) 349-3220 °(336) 342-7768 ° °(336) 342-8140   °[place] [facility] (336) 342-1394 or (336) 342-3537 (After Hours)    ° ° ° °

## 2015-01-13 NOTE — BH Assessment (Addendum)
Tele Assessment Note   [name] is an 34 y.o. male.  -Clinician reviewed note by Ellin Saba, PA.  Patient says he has been on a 3 day drug binge which included a lot of cocaine, some heroin and ETOH.  Patient had told PA that he was feeling suicidal while coming down from the drugs.  Plans to step into traffic, etc.  Had said he would shoot himself if he had a gun.  Patient explains that he has had a recent medication change at [facility].  This change was last week.  They had taken him off depakote and maintained his latuda and hydroxizine.  Patient did not take the latuda and hydroxizine while he was on the drug binge.  Patient says he feels that he is using the drugs to substitute for the depakote.  Patient is calm and pleasant.  He is currently denying wanting to kill himself.  He blames the drug binge and lack of sleep for three days to his having suicidal thoughts.  He denies current plan or intention and says he can maintain safety on his own.  Patient stays with friends but is essentially homeless.  Patient denies any HI or A/V hallucinations.  Patient is dissatisfied with [facility].  He has been going to Narcotics Anonymous meetings daily for the last 7 years.  He says that he also sees [name] for therapy.  A friend that he is staying with now also works for [employer] and had left a application for [employer] in case disposition is for ADACT.  Pt is also interested in possibly going to [facility].  -Clinician discussed patient care with Donell Sievert, PA who recommends that patient sign a "no harm" contract and be given outpatient resources.  Clinician spoke with Dr. Jeanella Anton who agreed with no harm contract and outpatient resources.  She went and spoke with patient also and informed him that he will be discharged home.  Patient said that he is willing to sign no harm contract.  He said that he would sign no harm contract and contact a friend to take him to [facility].  Pt did  sign no harm contract and will follow up w/ [facility].  Axis I: Bipolar, mixed and Substance Abuse Axis II: Deferred Axis III:  Past Medical History  Diagnosis Date  . Bipolar 1 disorder   . Heart murmur    Axis IV: economic problems, occupational problems, other psychosocial or environmental problems, problems related to legal system/crime and problems with primary support group Axis V: 31-40 impairment in reality testing  Past Medical History:  Past Medical History  Diagnosis Date  . Bipolar 1 disorder   . Heart murmur     Past Surgical History  Procedure Laterality Date  . Back surgery  2011    had melanoma mole removed    Family History:  Family History  Problem Relation Age of Onset  . Heart Problems Father     Social History:  reports that he has been smoking Cigarettes.  He has a 2.5 pack-year smoking history. He has never used smokeless tobacco. He reports that he drinks alcohol. He reports that he uses illicit drugs (Cocaine).  Additional Social History:  Alcohol / Drug Use Pain Medications: None Prescriptions: Latuda  and Hydroxizine Over the Counter: Ibuprophen when needed History of alcohol / drug use?: Yes Substance #1 Name of Substance 1: Cocaine 1 - Age of First Use: 34 years of age 61 - Amount (size/oz): Varies according  to fiances 1 - Frequency: More than 4x/W 1 - Duration: Last two months at this rate 1 - Last Use / Amount: 08/10.  Used a couple grams maybe.  CIWA: CIWA-Ar BP: 117/59 mmHg Pulse Rate: 72 COWS:    PATIENT STRENGTHS: (choose at least two) Average or above average intelligence Capable of independent living Communication skills Motivation for treatment/growth  Allergies: No Known Allergies  Home Medications:  (Not in a hospital admission)  OB/GYN Status:  No LMP for male patient.  General Assessment Data [facility] of [facility] Assessment: In system Is this a Tele or Face-to-Face Assessment?: Face-to-Face Is  this an Initial Assessment or a Re-assessment for this encounter?: Initial Assessment Marital status: Single Is patient pregnant?: No Pregnancy Status: No Living Arrangements: Non-relatives/Friends (Pt is essentially homeless) Can pt return to current living arrangement?: Yes Admission Status: Voluntary Is patient capable of signing voluntary admission?: Yes Referral Source: Self/Family/Friend Insurance type: MCD/MCR     Crisis Care Plan Living Arrangements: Non-relatives/Friends (Pt is essentially homeless) Name of Psychiatrist: Vesta Mixer Name of Therapist: Dr. Merlyn Albert May  Education Status Is patient currently in school?: No Highest grade of school patient has completed: 12th grade  Risk to self with the past 6 months Suicidal Ideation: No-Not Currently/Within Last 6 Months Has patient been a risk to self within the past 6 months prior to admission? : No Suicidal Intent: No Has patient had any suicidal intent within the past 6 months prior to admission? : No Is patient at risk for suicide?: No Suicidal Plan?: No-Not Currently/Within Last 6 Months Has patient had any suicidal plan within the past 6 months prior to admission? : No Access to Means: No What has been your use of drugs/alcohol within the last 12 months?: Cocaien Previous Attempts/Gestures: No How many times?: 0 Other Self Harm Risks: None Triggers for Past Attempts: None known Intentional Self Injurious Behavior: None Family Suicide History: No Recent stressful life event(s): Other (Comment) (Lack of sleep and medication change) Persecutory voices/beliefs?: No Depression: Yes Depression Symptoms: Despondent, Insomnia, Feeling worthless/self pity Substance abuse history and/or treatment for substance abuse?: Yes Suicide prevention information given to non-admitted patients: Not applicable  Risk to Others within the past 6 months Homicidal Ideation: No Does patient have any lifetime risk of violence toward others  beyond the six months prior to admission? : No Thoughts of Harm to Others: No Current Homicidal Intent: No Current Homicidal Plan: No Access to Homicidal Means: No Identified Victim: No one History of harm to others?: No Assessment of Violence: None Noted Violent Behavior Description: Pt denies Does patient have access to weapons?: No Criminal Charges Pending?: Yes Describe Pending Criminal Charges: Simple assault and a traffic violation Does patient have a court date: Yes Court Date: 01/26/15 Is patient on probation?: No  Psychosis Hallucinations: None noted Delusions: None noted  Mental Status Report Appearance/Hygiene: Body odor, In scrubs Eye Contact: Poor Motor Activity: Freedom of movement, Unremarkable Speech: Logical/coherent Level of Consciousness: Alert Mood: Anxious Affect: Depressed Anxiety Level: Moderate Thought Processes: Coherent, Relevant Judgement: Unimpaired Orientation: Person, Place, Time, Situation Obsessive Compulsive Thoughts/Behaviors: None  Cognitive Functioning Concentration: Fair Memory: Recent Intact, Remote Intact IQ: Average Insight: Fair Impulse Control: Fair Appetite: Good Weight Loss: 0 Weight Gain: 0 Sleep: Decreased Total Hours of Sleep:  (No sleep in the last 3 days) Vegetative Symptoms: None  ADLScreening [facility] Assessment Services) Patient's cognitive ability adequate to safely complete daily activities?: Yes Patient able to express need for assistance with ADLs?:  Yes Independently performs ADLs?: Yes (appropriate for developmental age)  Prior Inpatient Therapy Prior Inpatient Therapy: Yes Prior Therapy Dates: 2014 Prior Therapy Facilty/Provider(s): [facility] multiple times Reason for Treatment: SI and SA  Prior Outpatient Therapy Prior Outpatient Therapy: Yes Prior Therapy Dates: "Quite a few years" Prior Therapy Facilty/Provider(s): [facility]  [facility] Reason for Treatment: med management Does patient have an ACCT team?:  No Does patient have Intensive In-House Services?  : No Does patient have [facility] services? : No Does patient have P4CC services?: No  ADL Screening (condition at time of admission) Patient's cognitive ability adequate to safely complete daily activities?: Yes Is the patient deaf or have difficulty hearing?: No Does the patient have difficulty seeing, even when wearing glasses/contacts?: No Does the patient have difficulty concentrating, remembering, or making decisions?: No Patient able to express need for assistance with ADLs?: Yes Does the patient have difficulty dressing or bathing?: No Independently performs ADLs?: Yes (appropriate for developmental age) Does the patient have difficulty walking or climbing stairs?: No Weakness of Legs: None Weakness of Arms/Hands: None       Abuse/Neglect Assessment (Assessment to be complete while patient is alone) Physical Abuse: Denies Verbal Abuse: Denies Sexual Abuse: Denies Exploitation of patient/patient's resources: Denies Self-Neglect: Denies     Merchant navy officer (For Healthcare) Does patient have an advance directive?: No Would patient like information on creating an advanced directive?: No - patient declined information    Additional Information 1:1 In Past 12 Months?: No CIRT Risk: No Elopement Risk: No Does patient have medical clearance?: Yes     Disposition:  Disposition Initial Assessment Completed for this Encounter: Yes Disposition of Patient: Other dispositions Other disposition(s): Other (Comment) (To be reviewed by psychiatry)  Beatriz Stallion Ray 01/13/2015 6:54 AM

## 2015-08-01 IMAGING — CR DG FOOT COMPLETE 3+V*R*
3 series · 3 of 3 positions shown · non-contrast
Comparison: None.

CLINICAL DATA: Pain.

EXAM:
RIGHT FOOT COMPLETE - 3+ VIEW

[x foot ap right]
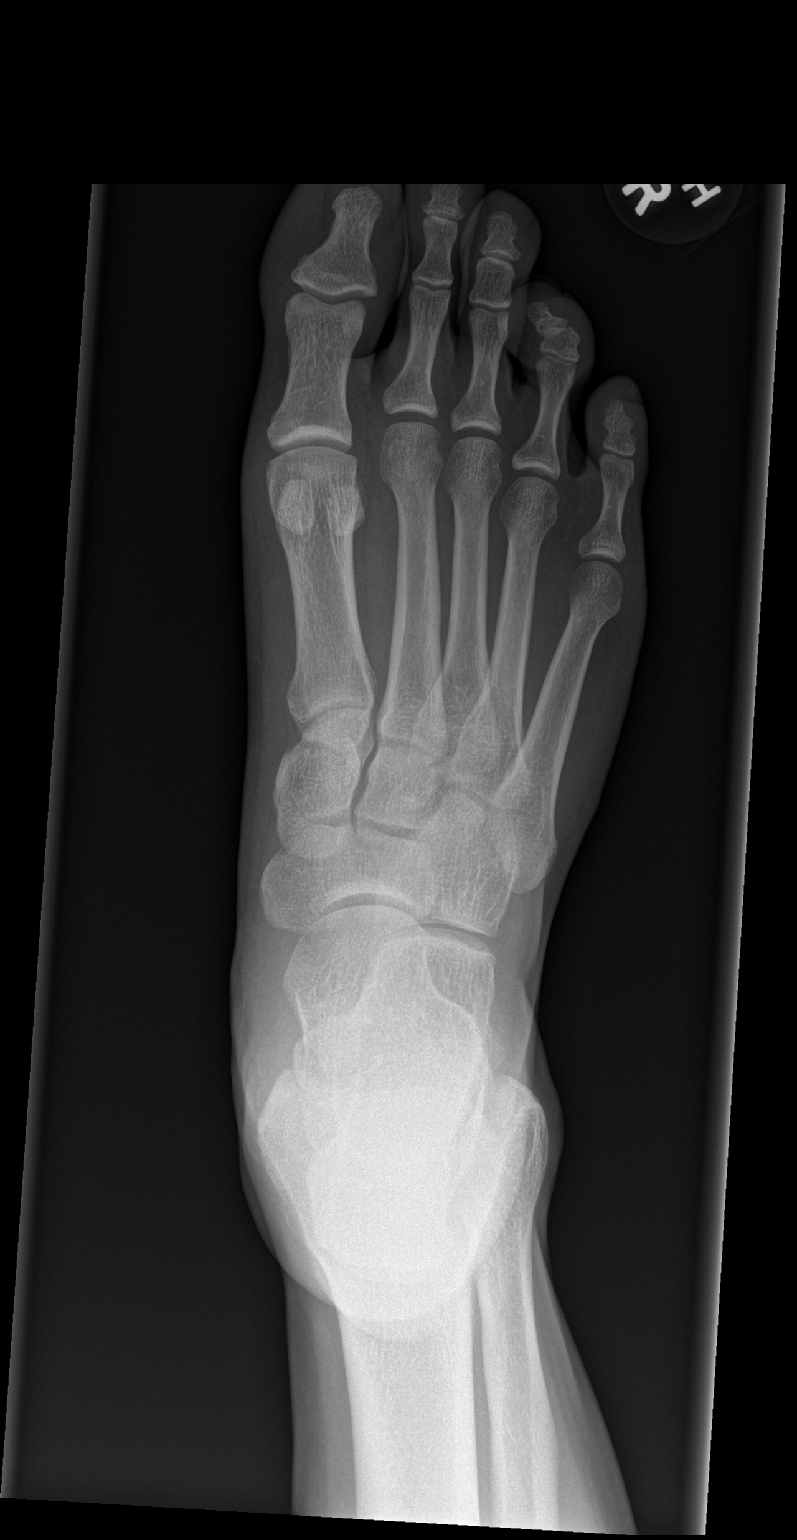

[x foot lat right]
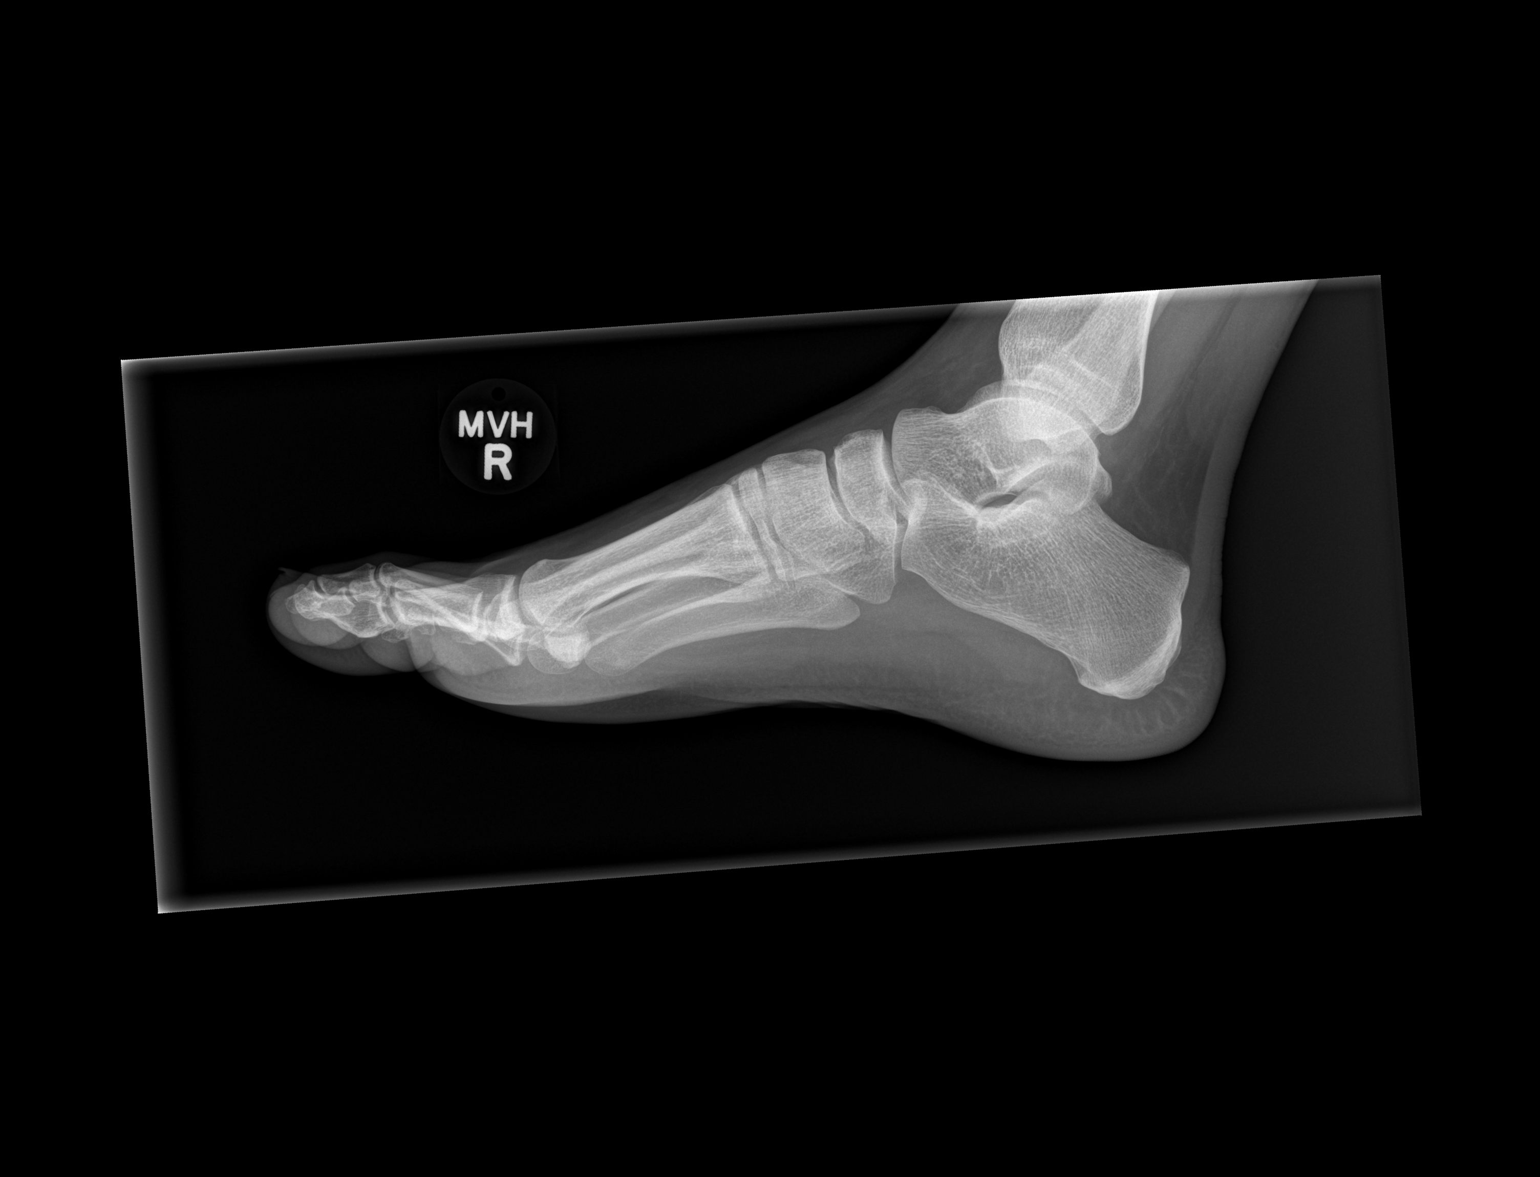

[x foot obl right]
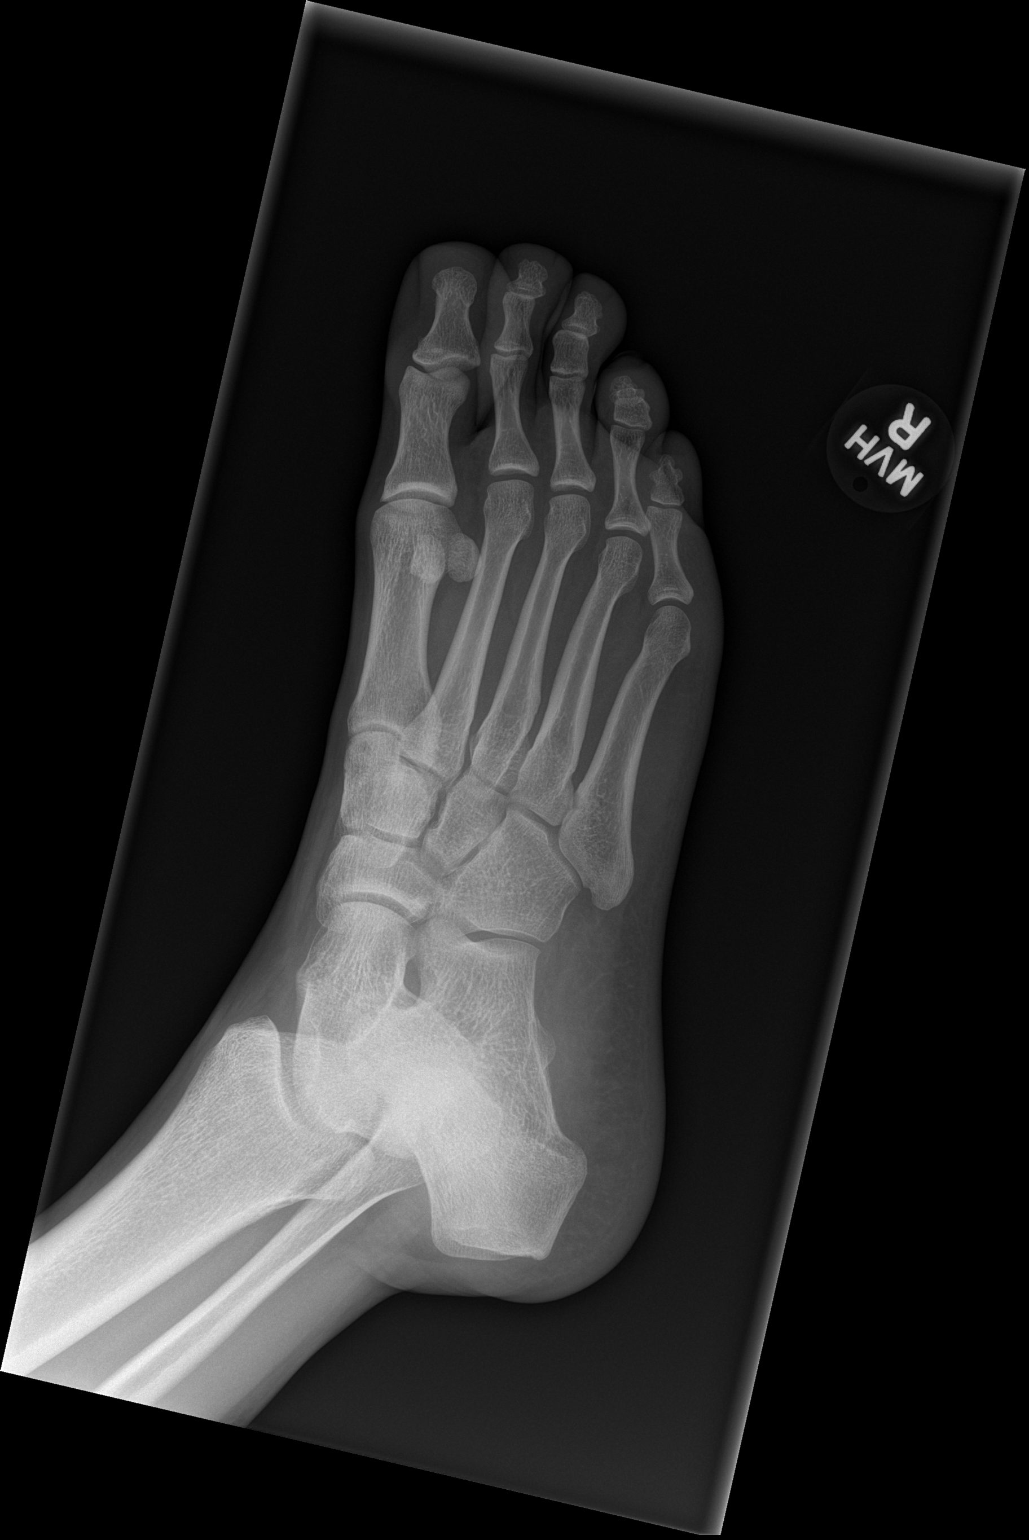

[3 of 3 positions shown; findings below may reference images not displayed]

FINDINGS: There is no evidence of fracture or dislocation. There is no
evidence of arthropathy or other focal bone abnormality. Soft
tissues are unremarkable.
IMPRESSION: Negative.

## 2015-08-26 ENCOUNTER — Encounter (HOSPITAL_COMMUNITY): Payer: Self-pay

## 2015-08-26 ENCOUNTER — Emergency Department (HOSPITAL_COMMUNITY)
Admission: EM | Admit: 2015-08-26 | Discharge: 2015-08-26 | Disposition: A | Discharge status: Other Acute Inpatient Hospital | Payer: Medicare Other | Attending: Emergency Medicine | Admitting: Emergency Medicine

## 2015-08-26 ENCOUNTER — Observation Stay (HOSPITAL_COMMUNITY)
Admission: AD | Admit: 2015-08-26 | Discharge: 2015-08-27 | Disposition: A | Discharge status: Home / Self Care | Payer: Medicare Other | Source: Intra-hospital | Attending: Psychiatry | Admitting: Psychiatry

## 2015-08-26 DIAGNOSIS — F3132 Bipolar disorder, current episode depressed, moderate: Secondary | ICD-10-CM | POA: Diagnosis not present

## 2015-08-26 DIAGNOSIS — R011 Cardiac murmur, unspecified: Secondary | ICD-10-CM | POA: Diagnosis not present

## 2015-08-26 DIAGNOSIS — F17210 Nicotine dependence, cigarettes, uncomplicated: Secondary | ICD-10-CM | POA: Insufficient documentation

## 2015-08-26 DIAGNOSIS — F3131 Bipolar disorder, current episode depressed, mild: Secondary | ICD-10-CM | POA: Insufficient documentation

## 2015-08-26 DIAGNOSIS — F1420 Cocaine dependence, uncomplicated: Secondary | ICD-10-CM | POA: Diagnosis present

## 2015-08-26 DIAGNOSIS — Z79899 Other long term (current) drug therapy: Secondary | ICD-10-CM | POA: Diagnosis not present

## 2015-08-26 DIAGNOSIS — R45851 Suicidal ideations: Secondary | ICD-10-CM | POA: Diagnosis present

## 2015-08-26 DIAGNOSIS — F919 Conduct disorder, unspecified: Secondary | ICD-10-CM | POA: Insufficient documentation

## 2015-08-26 DIAGNOSIS — F319 Bipolar disorder, unspecified: Secondary | ICD-10-CM

## 2015-08-26 DIAGNOSIS — Z85820 Personal history of malignant melanoma of skin: Secondary | ICD-10-CM | POA: Insufficient documentation

## 2015-08-26 HISTORY — DX: Depression, unspecified: F32.A

## 2015-08-26 HISTORY — DX: Major depressive disorder, single episode, unspecified: F32.9

## 2015-08-26 LAB — COMPREHENSIVE METABOLIC PANEL
ALT: 19 U/L (ref 17–63)
AST: 23 U/L (ref 15–41)
Albumin: 4.5 g/dL (ref 3.5–5.0)
Alkaline Phosphatase: 58 U/L (ref 38–126)
Anion gap: 11 (ref 5–15)
BUN: 7 mg/dL (ref 6–20)
CHLORIDE: 102 mmol/L (ref 101–111)
CO2: 25 mmol/L (ref 22–32)
Calcium: 9.6 mg/dL (ref 8.9–10.3)
Creatinine, Ser: 0.94 mg/dL (ref 0.61–1.24)
Glucose, Bld: 104 mg/dL — ABNORMAL HIGH (ref 65–99)
POTASSIUM: 3.9 mmol/L (ref 3.5–5.1)
SODIUM: 138 mmol/L (ref 135–145)
Total Bilirubin: 0.8 mg/dL (ref 0.3–1.2)
Total Protein: 7.4 g/dL (ref 6.5–8.1)

## 2015-08-26 LAB — ETHANOL: ALCOHOL ETHYL (B): 6 mg/dL — AB (ref <5)

## 2015-08-26 LAB — CBC
HCT: 43.7 % (ref 39.0–52.0)
HEMOGLOBIN: 14.3 g/dL (ref 13.0–17.0)
MCH: 30.6 pg (ref 26.0–34.0)
MCHC: 32.7 g/dL (ref 30.0–36.0)
MCV: 93.6 fL (ref 78.0–100.0)
Platelets: 222 10*3/uL (ref 150–400)
RBC: 4.67 MIL/uL (ref 4.22–5.81)
RDW: 13.5 % (ref 11.5–15.5)
WBC: 19 10*3/uL — AB (ref 4.0–10.5)

## 2015-08-26 LAB — RAPID URINE DRUG SCREEN, HOSP PERFORMED
Amphetamines: NOT DETECTED
BENZODIAZEPINES: NOT DETECTED
Barbiturates: NOT DETECTED
COCAINE: POSITIVE — AB
OPIATES: NOT DETECTED
TETRAHYDROCANNABINOL: NOT DETECTED

## 2015-08-26 LAB — ACETAMINOPHEN LEVEL

## 2015-08-26 LAB — SALICYLATE LEVEL: Salicylate Lvl: <4 mg/dL (ref 2.8–30.0)

## 2015-08-26 MED ORDER — ACETAMINOPHEN 325 MG PO TABS: 650.0000 mg | ORAL_TABLET | Freq: Four times a day (QID) | ORAL | Status: DC | PRN

## 2015-08-26 MED ORDER — DIVALPROEX SODIUM 500 MG PO DR TAB
500.0000 mg | DELAYED_RELEASE_TABLET | Freq: Every day | ORAL | Status: DC
  Administered 2015-08-26: 500 mg via ORAL
  Filled 2015-08-26: qty 1

## 2015-08-26 MED ORDER — RISPERIDONE 0.5 MG PO TABS
0.5000 mg | ORAL_TABLET | Freq: Every day | ORAL | Status: DC
  Administered 2015-08-26: 0.5 mg via ORAL
  Filled 2015-08-26: qty 1

## 2015-08-26 MED ORDER — LORAZEPAM 1 MG PO TABS: 0.0000 mg | ORAL_TABLET | Freq: Two times a day (BID) | ORAL | Status: DC

## 2015-08-26 MED ORDER — RISPERIDONE 0.5 MG PO TABS
0.5000 mg | ORAL_TABLET | Freq: Every day | ORAL | Status: DC
  Administered 2015-08-27: 0.5 mg via ORAL
  Filled 2015-08-26: qty 1

## 2015-08-26 MED ORDER — MAGNESIUM HYDROXIDE 400 MG/5ML PO SUSP: 30.0000 mL | Freq: Every day | ORAL | Status: DC | PRN

## 2015-08-26 MED ORDER — DIVALPROEX SODIUM 500 MG PO DR TAB
500.0000 mg | DELAYED_RELEASE_TABLET | Freq: Every day | ORAL | Status: DC
Start: 2015-08-26 — End: 2015-08-27

## 2015-08-26 MED ORDER — GABAPENTIN 300 MG PO CAPS
300.0000 mg | ORAL_CAPSULE | Freq: Three times a day (TID) | ORAL | Status: DC
Start: 2015-08-26 — End: 2015-08-26
  Administered 2015-08-26 (×2): 300 mg via ORAL
  Filled 2015-08-26 (×2): qty 1

## 2015-08-26 MED ORDER — GABAPENTIN 300 MG PO CAPS
300.0000 mg | ORAL_CAPSULE | Freq: Three times a day (TID) | ORAL | Status: DC
  Administered 2015-08-27 (×2): 300 mg via ORAL
  Filled 2015-08-26 (×2): qty 1

## 2015-08-26 MED ORDER — ALUM & MAG HYDROXIDE-SIMETH 200-200-20 MG/5ML PO SUSP: 30.0000 mL | ORAL | Status: DC | PRN

## 2015-08-26 MED ORDER — LORAZEPAM 1 MG PO TABS
0.0000 mg | ORAL_TABLET | Freq: Four times a day (QID) | ORAL | Status: DC
  Administered 2015-08-26: 2 mg via ORAL
  Filled 2015-08-26: qty 2

## 2015-08-26 NOTE — ED Notes (Signed)
Patient is A&Ox4, denies A/V hallucinations. Patient stated he is very depressed and suicidal, [place] plans [place] but they all fell through and that instigated the depression. Patient stated that he has been using drugs for a long time and when he uses cocaine he drinks. Patient presents as depressed, isolative, and hopeless with a blunted affect. Patient is laying in his bed and asked to be allowed to rest for awhile.  Patricia Perales, Wyman SongsterAngela Marie, RN

## 2015-08-26 NOTE — Progress Notes (Signed)
Patient accepted [facility], Observation Unit, bed #3.  Services of Dr. Lucianne MussKumar, MD.  Bed expected to be available this afternoon. Rosey BathKelly Sybilla Malhotra, RN

## 2015-08-26 NOTE — BH Assessment (Signed)
[facility] Assessment Progress Note  Per Thedore MinsMojeed Akintayo, MD, this pt would benefit [facility] at this [facility].  Rosey BathKelly Southard, RN, [facility] has [facility] to Obs 3 after the Observation Unit opens at 19:00.  Pt has signed Voluntary Admission and Consent for Treatment, as well as Consent to Release Information to no one, and signed forms have been faxed to [facility].  Pt's nurse has been notified, and agrees to send original paperwork along with pt via [facility], and to call report to [phone] or [phone].  Doylene Canninghomas Marjo Grosvenor, MA Triage Specialist [phone]

## 2015-08-26 NOTE — BH Assessment (Addendum)
Tele Assessment Note   [name] is an 35 y.o. male [facility] reporting suicidal ideations with a plan to jump in front of a car. Pt also stated "I could piss a cop off and he could shoot me". Pt reported multiple suicide attempts in the past but did not report any self-injurious behaviors. Pt reported multiple inpatient admissions and shared that he is receiving medication management through [facility]. Pt denies HI and AVH at this time. Pt reported that he is staying at the [place] house and is unsure if he will be allowed to return.  Pt reported that he is dealing with multiple stressors and is endorsing multiple depressive symptoms. Pt reported that he uses cocaine and drinks alcohol. Pt stated "I don't feel safe with myself". Pt denied having access to weapons and reported that he has two upcoming court dates in April. Pt did not report any physical, sexual or emotional abuse in the past.    Diagnosis: Bipolar disorder; Alcohol Use Disorder, Cocaine Use Disorder   Past Medical History:  Past Medical History  Diagnosis Date  . Bipolar 1 disorder (HCC)   . Heart murmur     Past Surgical History  Procedure Laterality Date  . Back surgery  2011    had melanoma mole removed    Family History:  Family History  Problem Relation Age of Onset  . Heart Problems Father     Social History:  reports that he has been smoking Cigarettes.  He has a 2.5 pack-year smoking history. He has never used smokeless tobacco. He reports that he drinks alcohol. He reports that he uses illicit drugs (Cocaine).  Additional Social History:  Alcohol / Drug Use History of alcohol / drug use?: Yes Longest period of sobriety (when/how long): 9 months  Negative Consequences of Use: Financial, Personal relationships, Work / School Withdrawal Symptoms:  (Pt denies at this time. ) Substance #1 Name of Substance 1: Alcohol  1 - Age of First Use: 16 1 - Amount (size/oz): 2-3 24oz 1 - Frequency: once  1  - Duration: 1 day  1 - Last Use / Amount: 08-25-15 Substance #2 Name of Substance 2:  Cocaine  2 - Age of First Use: 16 2 - Amount (size/oz): 8ball 2 - Frequency: twice  2 - Duration: 2 months  2 - Last Use / Amount: 08-25-15  CIWA: CIWA-Ar BP: 137/77 mmHg Pulse Rate: 110 COWS:    PATIENT STRENGTHS: (choose at least two) Average or above average intelligence Communication skills Motivation for treatment/growth  Allergies: No Known Allergies  Home Medications:  (Not in a hospital admission)  OB/GYN Status:  No LMP for male patient.  General Assessment Data [facility] Assessment: In system Is this a Tele or Face-to-Face Assessment?: Face-to-Face Is this an Initial Assessment or a Re-assessment for this encounter?: Initial Assessment Marital status: Single Living Arrangements: Other (Comment) ([facility]) Can pt return to current living arrangement?: No Admission Status: Voluntary Is patient capable of signing voluntary admission?: Yes Referral Source: Self/Family/Friend Insurance type: Medicare     Crisis Care Plan Living Arrangements: Other (Comment) Sport and exercise psychologist) Name of Psychiatrist: Transport planner  Name of Therapist: No provider reported   Education Status Is patient currently in school?: No Current Grade: N/A Highest grade of school patient has completed: 12 Name of school: N/A Contact person: N/A  Risk to self with the past 6 months Suicidal Ideation: Yes-Currently Present Has patient been a risk to self within  the past 6 months prior to admission? : No Suicidal Intent: Yes-Currently Present Has patient had any suicidal intent within the past 6 months prior to admission? : No Is patient at risk for suicide?: Yes Suicidal Plan?: Yes-Currently Present Has patient had any suicidal plan within the past 6 months prior to admission? : No Specify Current Suicidal Plan: Run into traffic  Access to Means: Yes Specify Access to Suicidal  Means: access to traffic  What has been your use of drugs/alcohol within the last 12 months?: Alcohol and cocaine use reported.  Previous Attempts/Gestures: Yes How many times?: 2 Other Self Harm Risks: Pt denies  Triggers for Past Attempts: Unpredictable Intentional Self Injurious Behavior: None Family Suicide History: No Recent stressful life event(s): Financial Problems, Other (Comment) (Housing ) Persecutory voices/beliefs?: No Depression: Yes Depression Symptoms: Tearfulness, Isolating, Guilt, Fatigue, Loss of interest in usual pleasures, Feeling angry/irritable, Feeling worthless/self pity Substance abuse history and/or treatment for substance abuse?: No Suicide prevention information given to non-admitted patients: Not applicable  Risk to Others within the past 6 months Homicidal Ideation: No Does patient have any lifetime risk of violence toward others beyond the six months prior to admission? : No Thoughts of Harm to Others: No Current Homicidal Intent: No Current Homicidal Plan: No Access to Homicidal Means: No Identified Victim: n History of harm to others?: No Assessment of Violence: None Noted Violent Behavior Description: N/A Does patient have access to weapons?: No Criminal Charges Pending?: Yes Describe Pending Criminal Charges: Simple assault, misdemeanor larceny Does patient have a court date: Yes Court Date: 09/06/15 (09/30/15) Is patient on probation?: Unknown  Psychosis Hallucinations: None noted Delusions: None noted  Mental Status Report Appearance/Hygiene: In scrubs Eye Contact: Good Motor Activity: Freedom of movement Speech: Logical/coherent Level of Consciousness: Quiet/awake Mood: Anxious, Depressed Affect: Appropriate to circumstance Anxiety Level: Minimal Thought Processes: Coherent, Relevant Judgement: Unimpaired Orientation: Place, Time, Situation, Person, Appropriate for developmental age Obsessive Compulsive Thoughts/Behaviors:  None  Cognitive Functioning Concentration: Decreased Memory: Remote Intact, Recent Intact IQ: Average Insight: Poor Impulse Control: Fair Appetite: Good Weight Loss: 0 Weight Gain: 0 Sleep: No Change Total Hours of Sleep: 8 Vegetative Symptoms: Staying in bed  ADLScreening [facility] Assessment Services) Patient's cognitive ability adequate to safely complete daily activities?: Yes Patient able to express need for assistance with ADLs?: Yes Independently performs ADLs?: Yes (appropriate for developmental age)  Prior Inpatient Therapy Prior Inpatient Therapy: Yes Prior Therapy Dates: 2004, 2008, 2013, 2014 [facility]  Reason for Treatment: bipolar, depression  Prior Outpatient Therapy Prior Outpatient Therapy: Yes Prior Therapy Dates: Current  Prior Therapy Facilty/Provider(s): [facility]  Reason for Treatment: medication management  Does patient have an ACCT team?: No Does patient have Intensive In-House Services?  : No Does patient have [facility] services? : Yes Does patient have P4CC services?: No  ADL Screening (condition at time of admission) Patient's cognitive ability adequate to safely complete daily activities?: Yes Is the patient deaf or have difficulty hearing?: No Does the patient have difficulty seeing, even when wearing glasses/contacts?: No Does the patient have difficulty concentrating, remembering, or making decisions?: No Patient able to express need for assistance with ADLs?: Yes Does the patient have difficulty dressing or bathing?: No Independently performs ADLs?: Yes (appropriate for developmental age)       Abuse/Neglect Assessment (Assessment to be complete while patient is alone) Physical Abuse: Denies Verbal Abuse: Denies Sexual Abuse: Denies Exploitation of patient/patient's resources: Denies Self-Neglect: Denies     Merchant navy officerAdvance Directives (  For Healthcare) Does patient have an advance directive?: No Would patient  like information on creating an advanced directive?: No - patient declined information    Additional Information 1:1 In Past 12 Months?: No CIRT Risk: No Elopement Risk: No Does patient have medical clearance?: Yes     Disposition: Inpatient treatment  Disposition Initial Assessment Completed for this Encounter: Yes  Mckyla Deckman S 08/26/2015 4:42 AM

## 2015-08-26 NOTE — ED Notes (Signed)
Pt transported to [facility] by [facility] transportation service for continuation of specialized care. Belongings given to driver after patient signed for them. Pt left in no acute distress. 

## 2015-08-26 NOTE — Plan of Care (Signed)
[facility] Observation Crisis Plan  Reason for Crisis Plan:  Substance Abuse   Plan of Care:  Referral for Substance Abuse  Family Support:    NA  Current Living Environment:  Living Arrangements: Other (Comment) ([facility])  Insurance:   Hospital Account    Name Acct ID Class Status Primary Coverage   [name], [name] [id number] BEHAVIORAL HEALTH OBSERVATION Open MEDICARE - MEDICARE PART A AND B        Guarantor Account (for Hospital Account [id number])    Name Relation to Pt Service Area Active? Acct Type   [name], [name]   Address Phone       [address] [place], [place] [address] [phone])          Coverage Information (for Hospital Account [id number])    1. MEDICARE/MEDICARE PART A AND B    F/O Payor/Plan Precert #   MEDICARE/MEDICARE PART A AND B    Subscriber Subscriber #   [name], [name] [id number]   Address Phone   [address] [place], [place] [id number]        2. [place]    F/O Payor/Plan Precert #   [facility]/[place]    Subscriber Subscriber #   [name], [name] [id number]   Address Phone   [address] [place], [place] [address] [phone]          Legal Guardian:   self  Primary Care Provider:  Johny BlamerHARRIS, WILLIAM, MD  Current [facility] Providers:  [facility]  Psychiatrist:   NA  Counselor/Therapist:   NA  Compliant with Medications:  Yes  Additional Information:   Cooper RenderSadler, Shree Espey Jean Horne 3/24/201711:36 PM

## 2015-08-26 NOTE — [facility] Suicide Risk Assessment (Deleted)
Suicide Risk Assessment  Discharge Assessment   [facility]   Principal Problem: Bipolar affective disorder [facility](HCC) Discharge Diagnoses:  Patient Active Problem List   Diagnosis Date Noted  . Cocaine dependence (HCC) [F14.20] 08/26/2015    Priority: High  . Bipolar affective disorder (HCC) [F31.9] 11/02/2012    Priority: High    Total Time spent with patient: 45 minutes  Musculoskeletal: Strength & Muscle Tone: within normal limits Gait & Station: normal Patient leans: N/A  Psychiatric Specialty Exam:   Blood pressure 118/58, pulse 83, temperature 98.3 F (36.8 C), temperature source Oral, resp. rate 18, SpO2 95 %.There is no weight on file to calculate BMI.  General Appearance: Casual  Eye Contact::  Good  Speech:  Normal Rate409  Volume:  Normal  Mood:  Depressed  Affect:  Congruent  Thought Process:  Coherent  Orientation:  Full (Time, Place, and Person)  Thought Content:  WDL  Suicidal Thoughts:  No  Homicidal Thoughts:  No  Memory:  Immediate;   Good Recent;   Good Remote;   Good  Judgement:  Fair  Insight:  Fair  Psychomotor Activity:  Normal  Concentration:  Good  Recall:  Good  Fund of Knowledge:Good  Language: Good  Akathisia:  No  Handed:  Right  AIMS (if indicated):     Assets:  Housing Leisure Time Physical Health Resilience Social Support  Sleep:     Cognition: WNL  ADL's:  Intact   Mental Status Per Nursing Assessment::   On Admission:   suicidal ideations  Demographic Factors:  Male and Caucasian  Loss Factors: Legal issues  Historical Factors: NA  Risk Reduction Factors:   Sense of responsibility to family, Employed, Living with another person, especially a relative, Positive social support and Positive therapeutic relationship  Continued Clinical Symptoms:  Depressed due to relapse yesterday  Cognitive Features That Contribute To Risk:  None    Suicide Risk:  Minimal: No identifiable suicidal  ideation.  Patients presenting with no risk factors but with morbid ruminations; may be classified as minimal risk based on the severity of the depressive symptoms    Plan Of Care/Follow-up recommendations:  Activity:  as tolerated Diet:  heart healthy diet  Duffy, Jaime NottinghamJAMISON, NP 08/26/2015, 6:17 PM

## 2015-08-26 NOTE — ED Notes (Signed)
Pt has in belonging bag:  Grey shoes, blue jeans, brown belt, black socks, black phone, black charger, black hat, black sunglasses, black shirt, red shirt, brown wallet ([place] ID, [employer] card [phone]).

## 2015-08-26 NOTE — ED Notes (Signed)
Patient reports SI with a plan to jump off bridge or suicide by cop. Patient denies HI, AVH at this time. Plan of care discussed and patient voices no complaints or concerns at this time. Q 15 min safety checks remain in place.

## 2015-08-26 NOTE — H&P (Signed)
Psychiatric [facility]-Observation Unit Assessment Adult  Patient Identification: [name] MRN:  [id number] Date of Evaluation:  08/26/2015 Chief Complaint: Patient states "I am very depressed and angry."  Principal Diagnosis: Bipolar affective disorder ([place]) most recent episode depressed  Diagnosis:   Patient Active Problem List   Diagnosis Date Noted  . Cocaine dependence ([place]) [F14.20] 08/26/2015  . Bipolar affective disorder ([place]) [F31.9] 11/02/2012   History of Present Illness::   [name] is an 35 y.o. male who presented to [facility] reporting suicidal ideations with a plan to jump in front of a car. Patient stated "I feel very overwhelmed. I need help with everything. I don't know where to start. I am depressed and angry. I was doing much better before I had to come back to [place] two months ago to deal with a court case for larceny. I was doing better back in [place]. I can't stay off drugs here. I want to stop using drugs as I use cocaine on a regular basis. I just can't stay clean. I got to [place] back home and was doing better until I had to come here to deal with problems that took place when I was using before. I am still having suicidal thoughts to jump in front of a car. I had also thought about getting a cop to shoot me because I am just tired of everything." The patient was accepted to the [facility] unit for further monitoring. Nursing staff report the patient appears very depressed and continues to express suicidal thoughts. Patient appears to have several psychosocial stressors that his currently driving his depressive symptoms.   Associated Signs/Symptoms: Depression Symptoms:  depressed mood, anhedonia, feelings of worthlessness/guilt, difficulty concentrating, hopelessness, recurrent thoughts of death, suicidal thoughts with specific plan, anxiety, loss of energy/fatigue, (Hypo) Manic Symptoms:  Irritable Mood, Anxiety Symptoms:  Excessive  Worry, Psychotic Symptoms:  Denies PTSD Symptoms: Negative Total Time spent with patient: 45 minutes  Past Psychiatric History: Bipolar 1, Cocaine abuse   Is the patient at risk to self? Yes.    Has the patient been a risk to self in the past 6 months? Yes.    Has the patient been a risk to self within the distant past? Yes.    Is the patient a risk to others? No.  Has the patient been a risk to others in the past 6 months? No.  Has the patient been a risk to others within the distant past? No.   Prior Inpatient Therapy: Prior Inpatient Therapy: Yes Prior Therapy Dates: 2004, 2008, 2013, 2014 Prior Therapy Facilty/Provider(s): [facility]  Reason for Treatment: bipolar, depression Prior Outpatient Therapy: Prior Outpatient Therapy: Yes Prior Therapy Dates: Current  Prior Therapy Facilty/Provider(s): [facility]  Reason for Treatment: medication management  Does patient have an ACCT team?: No Does patient have Intensive In-House Services?  : No Does patient have [facility] services? : Yes Does patient have P4CC services?: No  Alcohol Screening:   Substance Abuse History in the last 12 months:  Yes.   Consequences of Substance Abuse: Increased depression from cocaine usage  Previous Psychotropic Medications: Yes  Psychological Evaluations: No  Past Medical History:  Past Medical History  Diagnosis Date  . Bipolar 1 disorder ([place])   . Heart murmur     Past Surgical History  Procedure Laterality Date  . Back surgery  2011    had melanoma mole removed   Family History:  Family History  Problem Relation Age of Onset  . Heart Problems  Father    Family Psychiatric  History: Denies Tobacco Screening: Yes Social History:  History  Alcohol Use  . Yes    Comment: occasionally     History  Drug Use  . Yes  . Special: Cocaine    Comment: opiates- currently in treatment at [facility] (11/18/14)    Additional Social History: Marital status: Single    History of alcohol / drug  use?: Yes Longest period of sobriety (when/how long): 9 months  Negative Consequences of Use: Financial, Personal relationships, Work / Youth worker Withdrawal Symptoms:  (Pt denies at this time. ) Name of Substance 1: Alcohol  1 - Age of First Use: 16 1 - Amount (size/oz): 2-3 24oz 1 - Frequency: once  1 - Duration: 1 day  1 - Last Use / Amount: 08-25-15 Name of Substance 2:  Cocaine  2 - Age of First Use: 16 2 - Amount (size/oz): 8ball 2 - Frequency: twice  2 - Duration: 2 months  2 - Last Use / Amount: 08-25-15                Allergies:  No Known Allergies Lab Results:  Results for orders placed or performed during the hospital encounter of 08/26/15 (from the past 48 hour(s))  Comprehensive metabolic panel     Status: Abnormal   Collection Time: 08/26/15  3:32 AM  Result Value Ref Range   Sodium 138 135 - 145 mmol/L   Potassium 3.9 3.5 - 5.1 mmol/L   Chloride 102 101 - 111 mmol/L   CO2 25 22 - 32 mmol/L   Glucose, Bld 104 (H) 65 - 99 mg/dL   BUN 7 6 - 20 mg/dL   Creatinine, Ser 0.94 0.61 - 1.24 mg/dL   Calcium 9.6 8.9 - 10.3 mg/dL   Total Protein 7.4 6.5 - 8.1 g/dL   Albumin 4.5 3.5 - 5.0 g/dL   AST 23 15 - 41 U/L   ALT 19 17 - 63 U/L   Alkaline Phosphatase 58 38 - 126 U/L   Total Bilirubin 0.8 0.3 - 1.2 mg/dL   GFR calc non Af Amer >60 >60 mL/min   GFR calc Af Amer >60 >60 mL/min    Comment: (NOTE) The eGFR has been calculated using the CKD EPI equation. This calculation has not been validated in all clinical situations. eGFR's persistently <60 mL/min signify possible Chronic Kidney Disease.    Anion gap 11 5 - 15  Ethanol (ETOH)     Status: Abnormal   Collection Time: 08/26/15  3:32 AM  Result Value Ref Range   Alcohol, Ethyl (B) 6 (H) <5 mg/dL    Comment:        LOWEST DETECTABLE LIMIT FOR SERUM ALCOHOL IS 5 mg/dL FOR MEDICAL PURPOSES ONLY   Salicylate level     Status: None   Collection Time: 08/26/15  3:32 AM  Result Value Ref Range   Salicylate Lvl  <6.9 2.8 - 30.0 mg/dL  Acetaminophen level     Status: Abnormal   Collection Time: 08/26/15  3:32 AM  Result Value Ref Range   Acetaminophen (Tylenol), Serum <10 (L) 10 - 30 ug/mL    Comment:        THERAPEUTIC CONCENTRATIONS VARY SIGNIFICANTLY. A RANGE OF 10-30 ug/mL MAY BE AN EFFECTIVE CONCENTRATION FOR MANY PATIENTS. HOWEVER, SOME ARE BEST TREATED AT CONCENTRATIONS OUTSIDE THIS RANGE. ACETAMINOPHEN CONCENTRATIONS >150 ug/mL AT 4 HOURS AFTER INGESTION AND >50 ug/mL AT 12 HOURS AFTER INGESTION ARE OFTEN ASSOCIATED WITH TOXIC REACTIONS.  CBC     Status: Abnormal   Collection Time: 08/26/15  3:32 AM  Result Value Ref Range   WBC 19.0 (H) 4.0 - 10.5 K/uL   RBC 4.67 4.22 - 5.81 MIL/uL   Hemoglobin 14.3 13.0 - 17.0 g/dL   HCT 43.7 39.0 - 52.0 %   MCV 93.6 78.0 - 100.0 fL   MCH 30.6 26.0 - 34.0 pg   MCHC 32.7 30.0 - 36.0 g/dL   RDW 13.5 11.5 - 15.5 %   Platelets 222 150 - 400 K/uL  Urine rapid drug screen (hosp performed) (Not at [facility])     Status: Abnormal   Collection Time: 08/26/15  3:33 AM  Result Value Ref Range   Opiates NONE DETECTED NONE DETECTED   Cocaine POSITIVE (A) NONE DETECTED   Benzodiazepines NONE DETECTED NONE DETECTED   Amphetamines NONE DETECTED NONE DETECTED   Tetrahydrocannabinol NONE DETECTED NONE DETECTED   Barbiturates NONE DETECTED NONE DETECTED    Comment:        DRUG SCREEN FOR MEDICAL PURPOSES ONLY.  IF CONFIRMATION IS NEEDED FOR ANY PURPOSE, NOTIFY LAB WITHIN 5 DAYS.        LOWEST DETECTABLE LIMITS FOR URINE DRUG SCREEN Drug Class       Cutoff (ng/mL) Amphetamine      1000 Barbiturate      200 Benzodiazepine   341 Tricyclics       962 Opiates          300 Cocaine          300 THC              50     Blood Alcohol level:  Lab Results  Component Value Date   ETH 6* 08/26/2015   ETH <5 22/97/9892    Metabolic Disorder Labs:  No results found for: HGBA1C, MPG No results found for: PROLACTIN No results found for: CHOL, TRIG,  HDL, CHOLHDL, VLDL, LDLCALC  Current Medications: Current Facility-Administered Medications  Medication Dose Route Frequency Provider Last Rate Last Dose  . divalproex (DEPAKOTE) DR tablet 500 mg  500 mg Oral QHS Antonietta Breach, PA-C      . gabapentin (NEURONTIN) capsule 300 mg  300 mg Oral TID Patrecia Pour, NP      . risperiDONE (RISPERDAL) tablet 0.5 mg  0.5 mg Oral Daily Antonietta Breach, PA-C   0.5 mg at 08/26/15 1306   Current Outpatient Prescriptions  Medication Sig Dispense Refill  . albuterol (PROVENTIL HFA;VENTOLIN HFA) 108 (90 BASE) MCG/ACT inhaler Inhale 2 puffs into the lungs every 6 (six) hours as needed for wheezing.    . divalproex (DEPAKOTE) 500 MG DR tablet Take 1 tablet by mouth at bedtime.    . risperiDONE (RISPERDAL) 0.5 MG tablet Take 1 tablet by mouth daily.    . cyclobenzaprine (FLEXERIL) 10 MG tablet Take 1 tablet (10 mg total) by mouth 3 (three) times daily as needed for muscle spasms. (Patient not taking: Reported on 01/12/2015) 30 tablet 0  . divalproex (DEPAKOTE) 250 MG DR tablet Take 1 tablet (250 mg total) by mouth at bedtime. (Patient not taking: Reported on 08/26/2015) 30 tablet 0  . gabapentin (NEURONTIN) 100 MG capsule Take 1 capsule (100 mg total) by mouth 3 (three) times daily. For anxiety, agitation, neurogenic pain. (Patient not taking: Reported on 01/12/2015) 90 capsule 0  . ibuprofen (ADVIL,MOTRIN) 600 MG tablet Take 1 tablet (600 mg total) by mouth every 6 (six) hours as needed. (Patient not taking: Reported on  01/12/2015) 30 tablet 0  . minocycline (MINOCIN,DYNACIN) 100 MG capsule Take 1 capsule (100 mg total) by mouth 2 (two) times daily. (Patient not taking: Reported on 01/12/2015) 20 capsule 0   PTA Medications:  (Not in a hospital admission)  Musculoskeletal: Strength & Muscle Tone: within normal limits Gait & Station: normal Patient leans: N/A  Psychiatric Specialty Exam: Physical Exam  ROS  Blood pressure 118/58, pulse 83, temperature 98.3 F (36.8  C), temperature source Oral, resp. rate 18, SpO2 95 %.There is no weight on file to calculate BMI.  General Appearance: Casual  Eye Contact::  Fair  Speech:  Clear and Coherent  Volume:  Normal  Mood:  Dysphoric and Irritable  Affect:  Constricted  Thought Process:  Goal Directed and Intact  Orientation:  Full (Time, Place, and Person)  Thought Content:  Rumination  Suicidal Thoughts:  Yes.  with intent/plan  Homicidal Thoughts:  No  Memory:  Immediate;   Good Recent;   Fair Remote;   Fair  Judgement:  Impaired  Insight:  Shallow  Psychomotor Activity:  Increased  Concentration:  Fair  Recall:  [employer] of Knowledge:Good  Language: Good  Akathisia:  No  Handed:  Right  AIMS (if indicated):     Assets:  Communication Skills Desire for Improvement Financial Resources/Insurance Leisure Time Physical Health Resilience Social Support Talents/Skills  ADL's:  Intact  Cognition: WNL  Sleep:        Treatment Plan Summary: Daily contact with patient to assess and evaluate symptoms and progress in treatment and Medication management  Observation Level/Precautions:  Continuous Observation  Laboratory:  CBC Chemistry Profile UDS  Psychotherapy:  Individual   Medications:  Continue Depakote and Risperdal for mood control   Consultations:  As needed  Discharge Concerns:  Safety and Stability   Estimated LOS: Less than 48 hours  Other:  Counselor in [place] unit to explore options to help with housing and substance use    Elmarie Shiley, NP 3/24/20174:47 PM

## 2015-08-26 NOTE — BH Assessment (Signed)
Assessment completed. Consulted Alberteen SamFran Hobson, NP who recommended inpatient treatment. TTS to seek placement. Informed Antony MaduraKelly Humes, PA-C of recommendation.

## 2015-08-26 NOTE — ED Provider Notes (Signed)
CSN: [id number]     Arrival date & time 08/26/15  1191 History   First MD Initiated Contact with Patient 08/26/15 [phone]     Chief Complaint  Patient presents with  . Suicidal     (Consider location/radiation/quality/duration/timing/severity/associated sxs/prior Treatment) HPI Comments: 35 year old male with a history of bipolar 1 disorder and polysubstance abuse presents to the emergency department for evaluation of suicidal ideations. Patient states that he has been bipolar since he was 16. He currently takes Depakote and Risperdal, but states that these medications do not help him. He is followed by [facility] which she last saw one month ago. He denies any history of suicide attempts. No suicidal plan. Patient drank 3 bottles of 24 ounce beer and snorted cocaine prior to arrival. He states that he was 7 months clean from substances up until 25 days ago. He states that it is harder for him to be sober in [place] because he knows where to get drugs. He came to [place] from [place] because he had to appear in court.  The history is provided by the patient. No language interpreter was used.    Past Medical History  Diagnosis Date  . Bipolar 1 disorder (HCC)   . Heart murmur    Past Surgical History  Procedure Laterality Date  . Back surgery  2011    had melanoma mole removed   Family History  Problem Relation Age of Onset  . Heart Problems Father    Social History  Substance Use Topics  . Smoking status: Current Every Day Smoker -- 0.50 packs/day for 5 years    Types: Cigarettes    Last Attempt to Quit: 06/04/2013  . Smokeless tobacco: Never Used  . Alcohol Use: Yes     Comment: occasionally    Review of Systems  Psychiatric/Behavioral: Positive for suicidal ideas and behavioral problems.  All other systems reviewed and are negative.   Allergies  Review of patient's allergies indicates no known allergies.  Home Medications   Prior to Admission medications    Medication Sig Start Date End Date Taking? Authorizing Provider  albuterol (PROVENTIL HFA;VENTOLIN HFA) 108 (90 BASE) MCG/ACT inhaler Inhale 2 puffs into the lungs every 6 (six) hours as needed for wheezing.   Yes Historical Provider, MD  divalproex (DEPAKOTE) 500 MG DR tablet Take 1 tablet by mouth at bedtime. 07/28/15  Yes Historical Provider, MD  risperiDONE (RISPERDAL) 0.5 MG tablet Take 1 tablet by mouth daily. 07/28/15  Yes Historical Provider, MD  cyclobenzaprine (FLEXERIL) 10 MG tablet Take 1 tablet (10 mg total) by mouth 3 (three) times daily as needed for muscle spasms. Patient not taking: Reported on 01/12/2015 11/18/14   Myra Rude, MD  divalproex (DEPAKOTE) 250 MG DR tablet Take 1 tablet (250 mg total) by mouth at bedtime. Patient not taking: Reported on 08/26/2015 01/13/15   Tilden Fossa, MD  gabapentin (NEURONTIN) 100 MG capsule Take 1 capsule (100 mg total) by mouth 3 (three) times daily. For anxiety, agitation, neurogenic pain. Patient not taking: Reported on 01/12/2015 11/03/12   Tamala Julian, PA-C  ibuprofen (ADVIL,MOTRIN) 600 MG tablet Take 1 tablet (600 mg total) by mouth every 6 (six) hours as needed. Patient not taking: Reported on 01/12/2015 11/18/14   Myra Rude, MD  minocycline (MINOCIN,DYNACIN) 100 MG capsule Take 1 capsule (100 mg total) by mouth 2 (two) times daily. Patient not taking: Reported on 01/12/2015 10/26/13   Rodolph Bong, MD   BP 137/77 mmHg  Pulse 110  Temp(Src) 98.3 F (36.8 C) (Oral)  Resp 18  SpO2 100%   Physical Exam  Constitutional: He is oriented to person, place, and time. He appears well-developed and well-nourished. No distress.  HENT:  Head: Normocephalic and atraumatic.  Eyes: Conjunctivae and EOM are normal. No scleral icterus.  Neck: Normal range of motion.  Pulmonary/Chest: Effort normal. No respiratory distress.  Musculoskeletal: Normal range of motion.  Neurological: He is alert and oriented to person, place, and time. He  exhibits normal muscle tone. Coordination normal.  Skin: Skin is warm and dry. No rash noted. He is not diaphoretic. No erythema. No pallor.  Psychiatric: His speech is normal and behavior is normal. He exhibits a depressed mood. He expresses suicidal ideation. He expresses no homicidal ideation. He expresses no suicidal plans and no homicidal plans.  Nursing note and vitals reviewed.   ED Course  Procedures (including critical care time) Labs Review Labs Reviewed  COMPREHENSIVE METABOLIC PANEL - Abnormal; Notable for the following:    Glucose, Bld 104 (*)    All other components within normal limits  ETHANOL - Abnormal; Notable for the following:    Alcohol, Ethyl (B) 6 (*)    All other components within normal limits  ACETAMINOPHEN LEVEL - Abnormal; Notable for the following:    Acetaminophen (Tylenol), Serum <10 (*)    All other components within normal limits  CBC - Abnormal; Notable for the following:    WBC 19.0 (*)    All other components within normal limits  URINE RAPID DRUG SCREEN, HOSP PERFORMED - Abnormal; Notable for the following:    Cocaine POSITIVE (*)    All other components within normal limits  SALICYLATE LEVEL    Imaging Review No results found.   I have personally reviewed and evaluated these images and lab results as part of my medical decision-making.   EKG Interpretation None      MDM   Final diagnoses:  Bipolar 1 disorder (HCC)  Suicidal ideations    Patient medically cleared. He is pending placement for further inpatient psychiatric management. Disposition to be determined by oncoming ED provider.    Antony MaduraKelly Georges Victorio, PA-C 08/26/15 [id number]  April Palumbo, MD 08/26/15 [phone]

## 2015-08-26 NOTE — ED Notes (Signed)
Pt states that he's feeing really suicidal right now, he was living in [place] until a few weeks ago and had to come back here for court, he only has his dad here and feels like he's going to be stuck here. His next court date is April 4th, he financially can't afford to go back to [place] right now. Pt has been bipolar since he was 16.

## 2015-08-27 ENCOUNTER — Encounter (HOSPITAL_COMMUNITY): Payer: Self-pay

## 2015-08-27 DIAGNOSIS — F3131 Bipolar disorder, current episode depressed, mild: Secondary | ICD-10-CM | POA: Diagnosis not present

## 2015-08-27 MED ORDER — DIVALPROEX SODIUM 500 MG PO DR TAB: 500.0000 mg | DELAYED_RELEASE_TABLET | Freq: Every day | ORAL | Status: DC

## 2015-08-27 MED ORDER — GABAPENTIN 300 MG PO CAPS: 300.0000 mg | ORAL_CAPSULE | Freq: Three times a day (TID) | ORAL | Status: DC

## 2015-08-27 MED ORDER — RISPERIDONE 0.5 MG PO TABS: 0.5000 mg | ORAL_TABLET | Freq: Every day | ORAL | Status: DC

## 2015-08-27 NOTE — BH Assessment (Signed)
BHH Assessment Progress Note  Patient will be discharged this date to his father's residence and follow up with outpatient services at [facility] to address substance abuse issues and medication management. Patient will be discharged with prescriptions and was educated on the importance of medication management. Patient was also provided with other outpatient resources and return to [facility] where he has been residing. Patient was given information on trigger identification and a relapse prevention plan was started.

## 2015-08-27 NOTE — Progress Notes (Signed)
Nursing Discharge Note:  Patient has received all belongings and signed for them as well as signing the Discharge Summary and also given a copy of same. Patient denies any current SI/HI/AVH and states understanding of followup appointments and medications prescrbed. Patient [facility] patient on foot to nearby destination.

## 2015-08-27 NOTE — Progress Notes (Signed)
Pt. Is a 35 year old male with a history of bipolar d/o.  Pt. States he has been living in [place] and had to come back to the [place] area for a court date for larceny.  Pt. States he was clean and sober while living in [place] but relapsed after coming back to this area.  Pt. Reports thoughts of jumping in front of a car, or making police shoot him. Med hx includes a heart murmur and asthma as a child.  Pt. Is angry as self stating he was sober for 7 months.  [facility]. [facility]. States he has a long history of mental illness and has been in and out of facilities most of his life Pt.'s ETOH level was above 6 and he was postive for cocaine. Pt. Had taken his HS medications and was tired and sleeping during the assessment. No complaints of pain or discomfort noted. Pt. Is presently resting quietly and remains safe on the unit.

## 2015-08-27 NOTE — Discharge Instructions (Signed)
Patient will be discharged this date to his father's residence and follow up with outpatient services at [facility] to address substance abuse issues and medication management. Patient will be discharged with prescriptions and was educated on the importance of medication management. Patient was also provided with other outpatient resources and return to [facility] where he [facility] been residing. Patient was given information on trigger identification and a relapse prevention plan was started.

## 2015-08-27 NOTE — Progress Notes (Signed)
B[facility] INPATIENT:  Family/Significant Other Suicide Prevention Education  Suicide Prevention Education:  Patient Refusal for Family/Significant Other Suicide Prevention Education: The patient Rosalee Kaufmanatrick W Deans has refused to provide written consent for family/significant other to be provided Family/Significant Other Suicide Prevention Education during admission and/or prior to discharge.  Physician notified.  Cooper RenderSadler, Shaleka Brines Jean Horne 08/27/2015, 12:09 AM

## 2015-08-27 NOTE — Discharge Summary (Signed)
[facility] Discharge Summary Note  Patient:  [name] is an 35 y.o., male MRN:  [id number] DOB:  January 27, 1981 Patient phone:  [phone] (home)  Patient address:   [address] [place] [address],  Total Time spent with patient: 30 minutes  Date of Admission:  08/26/2015 Date of Discharge: 08/27/2015  Reason for Admission:  Depressive symptoms after relapsing on cocaine   Principal Problem: <principal problem not specified> Discharge Diagnoses: Patient Active Problem List   Diagnosis Date Noted  . Cocaine dependence (HCC) [F14.20] 08/26/2015  . Bipolar affective disorder, currently depressed, mild (HCC) [F31.31] 08/26/2015  . Bipolar affective disorder (HCC) [F31.9] 11/02/2012    Past Psychiatric History: Bipolar Disorder   Past Medical History:  Past Medical History  Diagnosis Date  . Bipolar 1 disorder (HCC)   . Heart murmur   . Depression     Past Surgical History  Procedure Laterality Date  . Back surgery  2011    had melanoma mole removed   Family History:  Family History  Problem Relation Age of Onset  . Heart Problems Father    Family Psychiatric  History: See H & P Social History:  History  Alcohol Use  . Yes    Comment: occasionally sober 7 months     History  Drug Use  . Yes  . Special: Cocaine    Comment: opiates- currently in treatment at [facility] (11/18/14) clean 7 months    Social History   Social History  . Marital Status: Single    Spouse Name: N/A  . Number of Children: N/A  . Years of Education: N/A   Social History Main Topics  . Smoking status: Current Every Day Smoker -- 1.00 packs/day for 5 years    Types: Cigarettes    Last Attempt to Quit: 06/04/2013  . Smokeless tobacco: Never Used  . Alcohol Use: Yes     Comment: occasionally sober 7 months  . Drug Use: Yes    Special: Cocaine     Comment: opiates- currently in treatment at [facility] (11/18/14) clean 7 months  . Sexual Activity: Not Currently   Other  Topics Concern  . None   Social History Narrative    Hospital Course:    [name] is an 35 y.o. male who presented to [facility] reporting suicidal ideations with a plan to jump in front of a car. Patient stated "I feel very overwhelmed. I need help with everything. I don't know where to start. I am depressed and angry. I was doing much better before I had to come back to [place] two months ago to deal with a court case for larceny. I was doing better back in [place]. I can't stay off drugs here. I want to stop using drugs as I use cocaine on a regular basis. I just can't stay clean. I got to [facility] back home and was doing better until I had to come here to deal with problems that took place when I was using before. I am still having suicidal thoughts to jump in front of a car. I had also thought about getting a cop to shoot me because I am just tired of everything." The patient was accepted to the [facility]Observ[facility] unit for further monitoring. Nursing staff report the patient appears very depressed and continues to express suicidal thoughts. Patient appears to have several psychosocial stressors that his currently driving his depressive symptoms.   Patient was admitted to the [facility] Unit for continued monitoring. During evaluation  in the Observation unit the patient appeared much less depressed and angry. Patient expressed a desire to return to his [address] and to resolve his legal charges through the courts. The patient denied any continued suicidal thoughts and felt that it could have been from the effects of cocaine and alcohol. His mood was noted to be euthymic and the patient appeared stable for discharge. Patient was provided with prescription for his medication as requested. The patient also denied further suicidal ideation to the counselor who also was present for assessment this morning. Patient left [facility] in no acute distress with all belongings returned to him.   Physical  Findings: AIMS: Facial and Oral Movements Muscles of Facial Expression: None, normal Lips and Perioral Area: None, normal Jaw: None, normal Tongue: None, normal,Extremity Movements Upper (arms, wrists, hands, fingers): None, normal Lower (legs, knees, ankles, toes): None, normal, Trunk Movements Neck, shoulders, hips: None, normal, Overall Severity Severity of abnormal movements (highest score from questions above): None, normal Incapacitation due to abnormal movements: None, normal Patient's awareness of abnormal movements (rate only patient's report): No Awareness, Dental Status Current problems with teeth and/or dentures?: No Does patient usually wear dentures?: No  CIWA:    COWS:     Musculoskeletal: Strength & Muscle Tone: within normal limits Gait & Station: normal Patient leans: N/A  Psychiatric Specialty Exam: Review of Systems  Constitutional: Negative.   HENT: Negative.   Eyes: Negative.   Respiratory: Negative.   Cardiovascular: Negative.   Gastrointestinal: Negative.   Genitourinary: Negative.   Musculoskeletal: Negative.   Skin: Negative.   Neurological: Negative.   Endo/Heme/Allergies: Negative.   Psychiatric/Behavioral: Positive for depression (Stable ) and substance abuse. Negative for suicidal ideas, hallucinations and memory loss. The patient is not nervous/anxious and does not have insomnia.     Blood pressure 118/58, pulse 81, temperature 97.7 F (36.5 C), temperature source Oral, resp. rate 17, height  (1.803 m), weight 83.008 kg (183 lb), SpO2 95 %.Body mass index is 25.53 kg/(m^2).  General Appearance: Casual  Eye Contact::  Good  Speech:  Clear and Coherent  Volume:  Normal  Mood:  Depressed  Affect:  Appropriate  Thought Process:  Goal Directed and Intact  Orientation:  Full (Time, Place, and Person)  Thought Content:  WDL  Suicidal Thoughts:  No  Homicidal Thoughts:  No  Memory:  Immediate;   Good Recent;   Good Remote;   Good   Judgement:  Fair  Insight:  Shallow  Psychomotor Activity:  Normal  Concentration:  Good  Recall:  Good  Fund of Knowledge:Good  Language: Good  Akathisia:  No  Handed:  Right  AIMS (if indicated):     Assets:  Communication Skills Desire for Improvement Financial Resources/Insurance Housing Leisure Time Physical Health Resilience  ADL's:  Intact  Cognition: WNL  Sleep:      Have you used any form of tobacco in the last 30 days? (Cigarettes, Smokeless Tobacco, Cigars, and/or Pipes): Yes  Has this patient used any form of tobacco in the last 30 days? (Cigarettes, Smokeless Tobacco, Cigars, and/or Pipes) Yes, Yes, A prescription for an FDA-approved tobacco cessation medication was offered at discharge and the patient refused  Blood Alcohol level:  Lab Results  Component Value Date   ETH 6* 08/26/2015   ETH <5 01/12/2015    Metabolic Disorder Labs:  No results found for: HGBA1C, MPG No results found for: PROLACTIN No results found for: CHOL, TRIG, HDL, CHOLHDL, VLDL, LDLCALC  Discharge  destination:  Home  Is patient on multiple antipsychotic therapies at discharge:  No   Has Patient had three or more failed trials of antipsychotic monotherapy by history:  No  Recommended Plan for Multiple Antipsychotic Therapies: NA     Medication List    STOP taking these medications        cyclobenzaprine 10 MG tablet  Commonly known as:  FLEXERIL     ibuprofen 600 MG tablet  Commonly known as:  ADVIL,MOTRIN     minocycline 100 MG capsule  Commonly known as:  MINOCIN,DYNACIN      TAKE these medications      Indication   albuterol 108 (90 Base) MCG/ACT inhaler  Commonly known as:  PROVENTIL HFA;VENTOLIN HFA  Inhale 2 puffs into the lungs every 6 (six) hours as needed for wheezing.      divalproex 500 MG DR tablet  Commonly known as:  DEPAKOTE  Take 1 tablet (500 mg total) by mouth at bedtime.   Indication:  Manic Phase of Manic-Depression     gabapentin 300 MG  capsule  Commonly known as:  NEURONTIN  Take 1 capsule (300 mg total) by mouth 3 (three) times daily.   Indication:  Aggressive Behavior, Agitation, Cocaine Dependence     risperiDONE 0.5 MG tablet  Commonly known as:  RISPERDAL  Take 1 tablet (0.5 mg total) by mouth daily.   Indication:  Manic-Depression        Follow-up recommendations:    As above   Comments:   Take all your medications as prescribed by your mental healthcare provider.  Report any adverse effects and or reactions from your medicines to your outpatient provider promptly.  Patient is instructed and cautioned to not engage in alcohol and or illegal drug use while on prescription medicines.  In the event of worsening symptoms, patient is instructed to call the crisis hotline, 911 and or go to the nearest ED for appropriate evaluation and treatment of symptoms.  Follow-up with your primary care provider for your other medical issues, concerns and or health care needs.   SignedFransisca Kaufmann: DAVIS, LAURA, NP 08/27/2015, 12:03 PM  Agree with plan.

## 2015-08-27 NOTE — Progress Notes (Signed)
Nursing Shift Note:  Patient sleeping, fairly easy to awaken verbally. Patient is cooperative, appropriate, does admit to SI without a plan, denies any HI or AVH. Patient is compliant with medications and contracts for safety on the Unit. Nurse administering medications per orders, providing emotional support, therapeutic commujication and ensuring continuous observation for safety except when patient in bathroom. Patient with no current complaints and remains safe on Unit.

## 2015-08-29 ENCOUNTER — Emergency Department (HOSPITAL_COMMUNITY)
Admission: EM | Admit: 2015-08-29 | Discharge: 2015-08-29 | Disposition: A | Discharge status: Home / Self Care | Payer: Medicare Other | Attending: Emergency Medicine | Admitting: Emergency Medicine

## 2015-08-29 ENCOUNTER — Encounter (HOSPITAL_COMMUNITY): Payer: Self-pay | Admitting: Emergency Medicine

## 2015-08-29 ENCOUNTER — Observation Stay (HOSPITAL_BASED_OUTPATIENT_CLINIC_OR_DEPARTMENT_OTHER)
Admission: AD | Admit: 2015-08-29 | Discharge: 2015-08-31 | Disposition: A | Discharge status: Home / Self Care | Payer: Medicare Other | Source: Intra-hospital | Attending: Psychiatry | Admitting: Psychiatry

## 2015-08-29 DIAGNOSIS — R011 Cardiac murmur, unspecified: Secondary | ICD-10-CM

## 2015-08-29 DIAGNOSIS — F3131 Bipolar disorder, current episode depressed, mild: Secondary | ICD-10-CM | POA: Diagnosis present

## 2015-08-29 DIAGNOSIS — R45851 Suicidal ideations: Secondary | ICD-10-CM

## 2015-08-29 DIAGNOSIS — F1424 Cocaine dependence with cocaine-induced mood disorder: Secondary | ICD-10-CM | POA: Insufficient documentation

## 2015-08-29 DIAGNOSIS — F1410 Cocaine abuse, uncomplicated: Secondary | ICD-10-CM | POA: Insufficient documentation

## 2015-08-29 DIAGNOSIS — F17210 Nicotine dependence, cigarettes, uncomplicated: Secondary | ICD-10-CM | POA: Diagnosis not present

## 2015-08-29 DIAGNOSIS — F319 Bipolar disorder, unspecified: Secondary | ICD-10-CM | POA: Insufficient documentation

## 2015-08-29 DIAGNOSIS — F419 Anxiety disorder, unspecified: Secondary | ICD-10-CM | POA: Insufficient documentation

## 2015-08-29 DIAGNOSIS — F1994 Other psychoactive substance use, unspecified with psychoactive substance-induced mood disorder: Secondary | ICD-10-CM | POA: Insufficient documentation

## 2015-08-29 DIAGNOSIS — Z79899 Other long term (current) drug therapy: Secondary | ICD-10-CM | POA: Diagnosis not present

## 2015-08-29 DIAGNOSIS — Z765 Malingerer [conscious simulation]: Secondary | ICD-10-CM

## 2015-08-29 DIAGNOSIS — F1420 Cocaine dependence, uncomplicated: Secondary | ICD-10-CM | POA: Diagnosis present

## 2015-08-29 DIAGNOSIS — Z85820 Personal history of malignant melanoma of skin: Secondary | ICD-10-CM | POA: Insufficient documentation

## 2015-08-29 DIAGNOSIS — F1310 Sedative, hypnotic or anxiolytic abuse, uncomplicated: Secondary | ICD-10-CM | POA: Insufficient documentation

## 2015-08-29 LAB — COMPREHENSIVE METABOLIC PANEL
ALT: 23 U/L (ref 17–63)
ANION GAP: 9 (ref 5–15)
AST: 20 U/L (ref 15–41)
Albumin: 4.6 g/dL (ref 3.5–5.0)
Alkaline Phosphatase: 62 U/L (ref 38–126)
BUN: 12 mg/dL (ref 6–20)
CHLORIDE: 103 mmol/L (ref 101–111)
CO2: 29 mmol/L (ref 22–32)
Calcium: 9.5 mg/dL (ref 8.9–10.3)
Creatinine, Ser: 1.12 mg/dL (ref 0.61–1.24)
GFR calc non Af Amer: >60 mL/min (ref >60)
Glucose, Bld: 99 mg/dL (ref 65–99)
Potassium: 4.1 mmol/L (ref 3.5–5.1)
Sodium: 141 mmol/L (ref 135–145)
Total Bilirubin: 0.9 mg/dL (ref 0.3–1.2)
Total Protein: 7.9 g/dL (ref 6.5–8.1)

## 2015-08-29 LAB — SALICYLATE LEVEL

## 2015-08-29 LAB — CBC
HCT: 46.2 % (ref 39.0–52.0)
HEMOGLOBIN: 15.4 g/dL (ref 13.0–17.0)
MCH: 31.3 pg (ref 26.0–34.0)
MCHC: 33.3 g/dL (ref 30.0–36.0)
MCV: 93.9 fL (ref 78.0–100.0)
Platelets: 231 10*3/uL (ref 150–400)
RBC: 4.92 MIL/uL (ref 4.22–5.81)
RDW: 13.4 % (ref 11.5–15.5)
WBC: 9.8 10*3/uL (ref 4.0–10.5)

## 2015-08-29 LAB — RAPID URINE DRUG SCREEN, HOSP PERFORMED
Amphetamines: NOT DETECTED
Barbiturates: NOT DETECTED
Benzodiazepines: POSITIVE — AB
COCAINE: POSITIVE — AB
OPIATES: NOT DETECTED
TETRAHYDROCANNABINOL: NOT DETECTED

## 2015-08-29 LAB — ACETAMINOPHEN LEVEL

## 2015-08-29 LAB — ETHANOL: Alcohol, Ethyl (B): <5 mg/dL (ref <5)

## 2015-08-29 MED ORDER — GABAPENTIN 300 MG PO CAPS
300.0000 mg | ORAL_CAPSULE | Freq: Three times a day (TID) | ORAL | Status: DC
  Administered 2015-08-30 – 2015-08-31 (×5): 300 mg via ORAL
  Filled 2015-08-29 (×5): qty 1

## 2015-08-29 MED ORDER — TRAZODONE HCL 50 MG PO TABS
50.0000 mg | ORAL_TABLET | Freq: Every evening | ORAL | Status: DC | PRN
  Administered 2015-08-30 (×2): 50 mg via ORAL
  Filled 2015-08-29 (×2): qty 1

## 2015-08-29 MED ORDER — NAPROXEN 500 MG PO TABS: 500.0000 mg | ORAL_TABLET | Freq: Two times a day (BID) | ORAL | Status: DC | PRN

## 2015-08-29 MED ORDER — RISPERIDONE 0.5 MG PO TABS
0.5000 mg | ORAL_TABLET | Freq: Every day | ORAL | Status: DC
  Administered 2015-08-29: 0.5 mg via ORAL
  Filled 2015-08-29: qty 1

## 2015-08-29 MED ORDER — ACETAMINOPHEN 325 MG PO TABS: 650.0000 mg | ORAL_TABLET | Freq: Four times a day (QID) | ORAL | Status: DC | PRN

## 2015-08-29 MED ORDER — DICYCLOMINE HCL 20 MG PO TABS
20.0000 mg | ORAL_TABLET | Freq: Four times a day (QID) | ORAL | Status: DC | PRN
Start: 2015-08-29 — End: 2015-08-31

## 2015-08-29 MED ORDER — ALUM & MAG HYDROXIDE-SIMETH 200-200-20 MG/5ML PO SUSP: 30.0000 mL | ORAL | Status: DC | PRN

## 2015-08-29 MED ORDER — ALBUTEROL SULFATE HFA 108 (90 BASE) MCG/ACT IN AERS: 2.0000 | INHALATION_SPRAY | Freq: Four times a day (QID) | RESPIRATORY_TRACT | Status: DC | PRN

## 2015-08-29 MED ORDER — DIVALPROEX SODIUM 500 MG PO DR TAB
500.0000 mg | DELAYED_RELEASE_TABLET | Freq: Every day | ORAL | Status: DC
  Administered 2015-08-30 (×2): 500 mg via ORAL
  Filled 2015-08-29 (×2): qty 1

## 2015-08-29 MED ORDER — DIVALPROEX SODIUM 500 MG PO DR TAB: 500.0000 mg | DELAYED_RELEASE_TABLET | Freq: Every day | ORAL | Status: DC

## 2015-08-29 MED ORDER — RISPERIDONE 0.5 MG PO TABS
0.5000 mg | ORAL_TABLET | Freq: Every day | ORAL | Status: DC
Start: 2015-08-30 — End: 2015-08-31
  Administered 2015-08-30 – 2015-08-31 (×2): 0.5 mg via ORAL
  Filled 2015-08-29 (×2): qty 1

## 2015-08-29 MED ORDER — LOPERAMIDE HCL 2 MG PO CAPS: 2.0000 mg | ORAL_CAPSULE | ORAL | Status: DC | PRN

## 2015-08-29 MED ORDER — ONDANSETRON 4 MG PO TBDP: 4.0000 mg | ORAL_TABLET | Freq: Four times a day (QID) | ORAL | Status: DC | PRN

## 2015-08-29 MED ORDER — MAGNESIUM HYDROXIDE 400 MG/5ML PO SUSP: 30.0000 mL | Freq: Every day | ORAL | Status: DC | PRN

## 2015-08-29 MED ORDER — HYDROXYZINE HCL 25 MG PO TABS
25.0000 mg | ORAL_TABLET | Freq: Four times a day (QID) | ORAL | Status: DC | PRN
  Administered 2015-08-30 – 2015-08-31 (×4): 25 mg via ORAL
  Filled 2015-08-29 (×4): qty 1

## 2015-08-29 MED ORDER — ZOLPIDEM TARTRATE 5 MG PO TABS
5.0000 mg | ORAL_TABLET | Freq: Every evening | ORAL | Status: DC | PRN
Start: 2015-08-29 — End: 2015-08-29

## 2015-08-29 MED ORDER — ACETAMINOPHEN 325 MG PO TABS: 650.0000 mg | ORAL_TABLET | ORAL | Status: DC | PRN

## 2015-08-29 MED ORDER — ONDANSETRON HCL 4 MG PO TABS: 4.0000 mg | ORAL_TABLET | Freq: Three times a day (TID) | ORAL | Status: DC | PRN

## 2015-08-29 MED ORDER — IBUPROFEN 200 MG PO TABS: 600.0000 mg | ORAL_TABLET | Freq: Three times a day (TID) | ORAL | Status: DC | PRN

## 2015-08-29 MED ORDER — GABAPENTIN 300 MG PO CAPS
300.0000 mg | ORAL_CAPSULE | Freq: Three times a day (TID) | ORAL | Status: DC
  Administered 2015-08-29: 300 mg via ORAL
  Filled 2015-08-29: qty 1

## 2015-08-29 MED ORDER — METHOCARBAMOL 500 MG PO TABS
500.0000 mg | ORAL_TABLET | Freq: Three times a day (TID) | ORAL | Status: DC | PRN
  Administered 2015-08-31: 500 mg via ORAL
  Filled 2015-08-29: qty 1

## 2015-08-29 NOTE — ED Notes (Signed)
Patient has 1 belonging bag of clothing. Bag is behind the Triage nursing station. Patient has Cell Phone with him in room RN Natalie ok'd. Bag and Patient has been wanded by security.

## 2015-08-29 NOTE — [facility] Counselor (Signed)
Hillery Jacksanika Lewis, FNP recommends inpatient treatment on observation unit.   Per Berneice Heinrichina Tate, AC, Pt accepted to OBS bed #4 asap under the care of Dr Lucianne MussKumar. Phones are dead on Obs unit, so call to report on Adult Unit ([phone]). Support paperwork was completed by pt in the presence of this counselor.   - Cyndie MullAnna Donavin Audino, [facility]   Therapeutic Triage   [facility]

## 2015-08-29 NOTE — BH Assessment (Addendum)
Tele Assessment Note   [name] is an 35 y.o. male. Pt presents voluntarily to [facility] endorsing SI. He states he has been thinking of walking into traffic to commit suicide. Pt is pleasant and oriented x 4. He is wearing scrubs and has a flat affect. Pt reports he was kicked out of the [address] and is living on the streets. He reports he was clean and sober for 7 mos until he relapsed 2 mos ago. He reports he hasn't slept in 3 days. He says he has been on a three-day cocaine binge, using approx $500 worth. Pt says he has been drinking every other day, approx six to eight 24-oz beers. He reports last night drinking approx six to eight 24 oz beers. Pt reports he rarely uses Xanax but used a few days ago. He describes his mood as "down, depressed, angry." Pt endorses insomnia, tearfulness, isolating bx, loss of interest in usual pleasures, irritability and worthlessness. Per chart review, pt has been inpatient at [facility] x 4. He was also discharged from [facility] Observation Unit two days ago. He says he barely remembers being in the OBS unit. He reports court date 09/06/15 for simple assault. Pt reports he isn't sure how he got to [facility] today. He says he thinks he walked to "my dad's storage unit" to sleep. Pt says his dad mustve called EMS. Pt says he has supportive friends who want him to get clean. Pt expressed regret at relapsing. He says he relapsed two mos ago when he had to come back to [facility] for a court date. Pt says he needs to get substance abuse treatment someplace other than [place] or else he will fall back in with the same people, places, and things which contributed to the relapse. Pt denies HI. [facility] and no delusions noted. He denies any prior suicide attempts.   Diagnosis:   Bipolar I Disorder Cocaine Use Disorder, Moderate Alcohol Use Disorder, Severe   Past Medical History:  Past Medical History  Diagnosis Date  . Bipolar 1 disorder (HCC)   . Heart murmur   . Depression      Past Surgical History  Procedure Laterality Date  . Back surgery  2011    had melanoma mole removed    Family History:  Family History  Problem Relation Age of Onset  . Heart Problems Father     Social History:  reports that he has been smoking Cigarettes.  He has a 5 pack-year smoking history. He has never used smokeless tobacco. He reports that he drinks alcohol. He reports that he uses illicit drugs (Cocaine).  Additional Social History:  Alcohol / Drug Use Pain Medications: pt denies abuse Prescriptions: pt reports rare use of xanax Over the Counter: pt denies abuse History of alcohol / drug use?: Yes Longest period of sobriety (when/how long): 7 months Negative Consequences of Use: Financial, Legal, Personal relationships, Work / School Substance #1 Name of Substance 1: alcohol 1 - Age of First Use: 16 1 - Amount (size/oz): six to eight 24-oz beers 1 - Frequency: every other day 1 - Duration: months now 1 - Last Use / Amount: 3/26-17 - six to eight 24-oz beers and two mixed drinks Substance #2 Name of Substance 2: cocaine - powder 2 - Age of First Use: 16 2 - Duration: four day binge 2 - Last Use / Amount: 08/28/15 - $500 worth Substance #3 Name of Substance 3: xanax 3 - Frequency: rarely 3 -  Last Use / Amount: 08/28/15   CIWA: CIWA-Ar BP: 139/63 mmHg Pulse Rate: 75 COWS:    PATIENT STRENGTHS: (choose at least two) Average or above average intelligence Capable of independent living Communication skills Physical Health Supportive family/friends  Allergies: No Known Allergies  Home Medications:  (Not in a hospital admission)  OB/GYN Status:  No LMP for male patient.  General Assessment Data [facility] of [facility]: [facility] T[facility] Assessment: In system Is this a Tele or Face-to-Face Assessment?: Face-to-Face Is this an Initial Assessment or a Re-assessment for this encounter?: Initial Assessment Marital status: Single Is patient pregnant?: No Pregnancy  Status: No Living Arrangements: Other (Comment) (homeless on the streets) Can pt return to current living arrangement?: Yes Admission Status: Voluntary Is patient capable of signing voluntary admission?: Yes Referral Source: Self/Family/Friend Insurance type: medicare     Crisis Care Plan Living Arrangements: Other (Comment) (homeless on the streets) Name of Psychiatrist: Transport planner Name of Therapist: n/a  Education Status Is patient currently in school?: No Highest grade of school patient has completed: 13 Name of school: [employer]  Risk to self with the past 6 months Suicidal Ideation: Yes-Currently Present Has patient been a risk to self within the past 6 months prior to admission? : Yes Suicidal Intent: No Has patient had any suicidal intent within the past 6 months prior to admission? : Yes Is patient at risk for suicide?: Yes Suicidal Plan?:  (pt thinks about walking into traffic) Has patient had any suicidal plan within the past 6 months prior to admission? : Yes Access to Means: Yes Specify Access to Suicidal Means: access to traffic What has been your use of drugs/alcohol within the last 12 months?: 4 day cocaine binge, alcohol every other day, rare xanx use  Previous Attempts/Gestures: No How many times?: 0 Other Self Harm Risks: none Triggers for Past Attempts:  (n/a) Intentional Self Injurious Behavior: None Family Suicide History: No Recent stressful life event(s): Other (Comment) (relapsed 2 mos ago after 7 mos clean and sober) Persecutory voices/beliefs?: No Depression: Yes Depression Symptoms: Feeling angry/irritable, Feeling worthless/self pity, Tearfulness, Insomnia, Loss of interest in usual pleasures, Fatigue Substance abuse history and/or treatment for substance abuse?: Yes Suicide prevention information given to non-admitted patients: Not applicable  Risk to Others within the past 6 months Homicidal Ideation: No Does patient have any lifetime  risk of violence toward others beyond the six months prior to admission? : No Thoughts of Harm to Others: No Current Homicidal Intent: No Current Homicidal Plan: No Access to Homicidal Means: No Identified Victim: none History of harm to others?: No Assessment of Violence: None Noted Violent Behavior Description: pt has upcoming charge of simple assault Does patient have access to weapons?: No Criminal Charges Pending?: Yes Describe Pending Criminal Charges: simple assault Does patient have a court date: Yes Court Date: 09/06/15 Is patient on probation?: Unknown  Psychosis Hallucinations: None noted Delusions: None noted  Mental Status Report Appearance/Hygiene: In scrubs, Unremarkable Eye Contact: Good Motor Activity: Freedom of movement Speech: Logical/coherent Level of Consciousness: Quiet/awake, Alert Mood: Depressed, Angry, Sad Affect: Blunted, Appropriate to circumstance Anxiety Level: Minimal Thought Processes: Relevant, Coherent Judgement: Impaired Orientation: Person, Place, Time, Situation Obsessive Compulsive Thoughts/Behaviors: None  Cognitive Functioning Concentration: Decreased Memory: Recent Intact, Remote Intact IQ: Average Insight: Fair Impulse Control: Poor Appetite: Fair Sleep: Decreased Total Hours of Sleep:  (states hasn't slept in 3 days) Vegetative Symptoms: None  ADLScreening [facility] Assessment Services) Patient's cognitive ability adequate to safely complete daily activities?: Yes  Patient able to express need for assistance with ADLs?: Yes Independently performs ADLs?: Yes (appropriate for developmental age)  Prior Inpatient Therapy Prior Inpatient Therapy: Yes Prior Therapy Dates: from 2004 to 2016 [facility] Reason for Treatment: bipolar, substance abuse  Prior Outpatient Therapy Prior Outpatient Therapy: Yes Prior Therapy Dates: currently Prior Therapy Facilty/Provider(s): [facility] Reason for  Treatment: med management Does patient have an ACCT team?: No Does patient have Intensive In-House Services?  : No Does patient have [facility] services? : Yes Does patient have P4CC services?: Unknown  ADL Screening (condition at time of admission) Patient's cognitive ability adequate to safely complete daily activities?: Yes Is the patient deaf or have difficulty hearing?: No Does the patient have difficulty seeing, even when wearing glasses/contacts?: No Does the patient have difficulty concentrating, remembering, or making decisions?: Yes Patient able to express need for assistance with ADLs?: Yes Does the patient have difficulty dressing or bathing?: No Independently performs ADLs?: Yes (appropriate for developmental age) Does the patient have difficulty walking or climbing stairs?: No Weakness of Legs: None Weakness of Arms/Hands: None  Home Assistive Devices/Equipment Home Assistive Devices/Equipment: None    Abuse/Neglect Assessment (Assessment to be complete while patient is alone) Physical Abuse: Denies Verbal Abuse: Denies Sexual Abuse: Denies Exploitation of patient/patient's resources: Denies Self-Neglect: Denies     Merchant navy officerAdvance Directives (For Healthcare) Does patient have an advance directive?: No Would patient like information on creating an advanced directive?: No - patient declined information    Additional Information 1:1 In Past 12 Months?: No CIRT Risk: No Elopement Risk: No Does patient have medical clearance?: No     Disposition:  Disposition Initial Assessment Completed for this Encounter: Yes Disposition of Patient: Other dispositions Type of inpatient treatment program:  [facility](tanika lewis NP recommends OBS)  [name] 08/29/2015 12:29 PM

## 2015-08-29 NOTE — H&P (Signed)
Psychiatric Admission Assessment Adult  Patient Identification: [name] MRN:  [id number] Date of Evaluation:  08/29/2015 Chief Complaint: SI, Drug seeking behaviors Principal Diagnosis: Substance Induced Mood Disorder, Bipolar Affective Disorder, Cocaine Abuse.   Patient Active Problem List   Diagnosis Date Noted  . Substance induced mood disorder ([place]) [F19.94] 08/29/2015  . Cocaine dependence ([place]) [F14.20] 08/26/2015  . Bipolar affective disorder, currently depressed, mild ([place]) [F31.31] 08/26/2015  . Bipolar affective disorder ([place]) [F31.9] 11/02/2012   History of Present Illness: Per review of previous TTS Assessment ( Attached) the patient presents to the emergency department with suicidal ideations and drug abuse. The patient states that he has been using cocaine and other drugs along with alcohol. Patient states that this condition seems to have gotten worse over the last few days. Patient states nothing seems make his condition, better. Patient denies chest pain, shortness of breath, headache, blurred vision, back pain, neck pain, fever, abdominal pain, hallucinations, nausea, vomiting, diarrhea, near syncope or syncope  Associated Signs/Symptoms: Depression Symptoms:  depressed mood, feelings of worthlessness/guilt, suicidal thoughts without plan, (Hypo) Manic Symptoms:  n/a Anxiety Symptoms:  Excessive Worry, Psychotic Symptoms:  Delusions, PTSD Symptoms: NA Total Time spent with patient: 30 minutes  Past Psychiatric History: see HPI  Is the patient at risk to self? Yes.    Has the patient been a risk to self in the past 6 months? Yes.    Has the patient been a risk to self within the distant past? Yes.    Is the patient a risk to others? Yes.    Has the patient been a risk to others in the past 6 months? Yes.    Has the patient been a risk to others within the distant past? Yes.     Prior Inpatient Therapy:  yes Prior Outpatient Therapy:  yes  Alcohol  Screening:   Substance Abuse History in the last 12 months:  Yes.   Consequences of Substance Abuse: Family Consequences:  strained relationships Previous Psychotropic Medications: Yes  Psychological Evaluations: Yes  Past Medical History:  Past Medical History  Diagnosis Date  . Bipolar 1 disorder ([place])   . Heart murmur   . Depression     Past Surgical History  Procedure Laterality Date  . Back surgery  2011    had melanoma mole removed   Family History:  Family History  Problem Relation Age of Onset  . Heart Problems Father    Family Psychiatric  History: Uknown Tobacco Screening: Social History:  History  Alcohol Use  . Yes    Comment: occasionally sober 7 months     History  Drug Use  . Yes  . Special: Cocaine    Comment: opiates- currently in treatment at [facility] (11/18/14) clean 7 months    Additional Social History:                           Allergies:  No Known Allergies Lab Results:  Results for orders placed or performed during the hospital encounter of 08/29/15 (from the past 48 hour(s))  Comprehensive metabolic panel     Status: None   Collection Time: 08/29/15  9:13 AM  Result Value Ref Range   Sodium 141 135 - 145 mmol/L   Potassium 4.1 3.5 - 5.1 mmol/L   Chloride 103 101 - 111 mmol/L   CO2 29 22 - 32 mmol/L   Glucose, Bld 99 65 - 99 mg/dL   BUN  12 6 - 20 mg/dL   Creatinine, Ser 1.12 0.61 - 1.24 mg/dL   Calcium 9.5 8.9 - 10.3 mg/dL   Total Protein 7.9 6.5 - 8.1 g/dL   Albumin 4.6 3.5 - 5.0 g/dL   AST 20 15 - 41 U/L   ALT 23 17 - 63 U/L   Alkaline Phosphatase 62 38 - 126 U/L   Total Bilirubin 0.9 0.3 - 1.2 mg/dL   GFR calc non Af Amer >60 >60 mL/min   GFR calc Af Amer >60 >60 mL/min    Comment: (NOTE) The eGFR has been calculated using the CKD EPI equation. This calculation has not been validated in all clinical situations. eGFR's persistently <60 mL/min signify possible Chronic Kidney Disease.    Anion gap 9 5 - 15  CBC      Status: None   Collection Time: 08/29/15  9:13 AM  Result Value Ref Range   WBC 9.8 4.0 - 10.5 K/uL   RBC 4.92 4.22 - 5.81 MIL/uL   Hemoglobin 15.4 13.0 - 17.0 g/dL   HCT 46.2 39.0 - 52.0 %   MCV 93.9 78.0 - 100.0 fL   MCH 31.3 26.0 - 34.0 pg   MCHC 33.3 30.0 - 36.0 g/dL   RDW 13.4 11.5 - 15.5 %   Platelets 231 150 - 400 K/uL  Ethanol (ETOH)     Status: None   Collection Time: 08/29/15  9:15 AM  Result Value Ref Range   Alcohol, Ethyl (B) <5 <5 mg/dL    Comment:        LOWEST DETECTABLE LIMIT FOR SERUM ALCOHOL IS 5 mg/dL FOR MEDICAL PURPOSES ONLY   Salicylate level     Status: None   Collection Time: 08/29/15  9:15 AM  Result Value Ref Range   Salicylate Lvl <3.3 2.8 - 30.0 mg/dL  Acetaminophen level     Status: Abnormal   Collection Time: 08/29/15  9:15 AM  Result Value Ref Range   Acetaminophen (Tylenol), Serum <10 (L) 10 - 30 ug/mL    Comment:        THERAPEUTIC CONCENTRATIONS VARY SIGNIFICANTLY. A RANGE OF 10-30 ug/mL MAY BE AN EFFECTIVE CONCENTRATION FOR MANY PATIENTS. HOWEVER, SOME ARE BEST TREATED AT CONCENTRATIONS OUTSIDE THIS RANGE. ACETAMINOPHEN CONCENTRATIONS >150 ug/mL AT 4 HOURS AFTER INGESTION AND >50 ug/mL AT 12 HOURS AFTER INGESTION ARE OFTEN ASSOCIATED WITH TOXIC REACTIONS.   Urine rapid drug screen (hosp performed) (Not at [facility])     Status: Abnormal   Collection Time: 08/29/15  9:17 AM  Result Value Ref Range   Opiates NONE DETECTED NONE DETECTED   Cocaine POSITIVE (A) NONE DETECTED   Benzodiazepines POSITIVE (A) NONE DETECTED   Amphetamines NONE DETECTED NONE DETECTED   Tetrahydrocannabinol NONE DETECTED NONE DETECTED   Barbiturates NONE DETECTED NONE DETECTED    Comment:        DRUG SCREEN FOR MEDICAL PURPOSES ONLY.  IF CONFIRMATION IS NEEDED FOR ANY PURPOSE, NOTIFY LAB WITHIN 5 DAYS.        LOWEST DETECTABLE LIMITS FOR URINE DRUG SCREEN Drug Class       Cutoff (ng/mL) Amphetamine      1000 Barbiturate      200 Benzodiazepine    007 Tricyclics       622 Opiates          300 Cocaine          300 THC              50  Blood Alcohol level:  Lab Results  Component Value Date   ETH <5 08/29/2015   ETH 6* 40/81/4481    Metabolic Disorder Labs:  No results found for: HGBA1C, MPG No results found for: PROLACTIN No results found for: CHOL, TRIG, HDL, CHOLHDL, VLDL, LDLCALC  Current Medications: Current Facility-Administered Medications  Medication Dose Route Frequency Provider Last Rate Last Dose  . acetaminophen (TYLENOL) tablet 650 mg  650 mg Oral Q6H PRN Laverle Hobby, PA-C      . albuterol (PROVENTIL HFA;VENTOLIN HFA) 108 (90 Base) MCG/ACT inhaler 2 puff  2 puff Inhalation Q6H PRN Laverle Hobby, PA-C      . alum & mag hydroxide-simeth (MAALOX/MYLANTA) 200-200-20 MG/5ML suspension 30 mL  30 mL Oral Q4H PRN Laverle Hobby, PA-C      . dicyclomine (BENTYL) tablet 20 mg  20 mg Oral Q6H PRN Laverle Hobby, PA-C      . divalproex (DEPAKOTE) DR tablet 500 mg  500 mg Oral QHS Laverle Hobby, PA-C      . [START ON 08/30/2015] gabapentin (NEURONTIN) capsule 300 mg  300 mg Oral TID Laverle Hobby, PA-C      . hydrOXYzine (ATARAX/VISTARIL) tablet 25 mg  25 mg Oral Q6H PRN Laverle Hobby, PA-C      . loperamide (IMODIUM) capsule 2-4 mg  2-4 mg Oral PRN Laverle Hobby, PA-C      . magnesium hydroxide (MILK OF MAGNESIA) suspension 30 mL  30 mL Oral Daily PRN Laverle Hobby, PA-C      . methocarbamol (ROBAXIN) tablet 500 mg  500 mg Oral Q8H PRN Laverle Hobby, PA-C      . naproxen (NAPROSYN) tablet 500 mg  500 mg Oral BID PRN Laverle Hobby, PA-C      . ondansetron (ZOFRAN-ODT) disintegrating tablet 4 mg  4 mg Oral Q6H PRN Laverle Hobby, PA-C      . [START ON 08/30/2015] risperiDONE (RISPERDAL) tablet 0.5 mg  0.5 mg Oral Daily Laverle Hobby, PA-C      . [START ON 08/30/2015] traZODone (DESYREL) tablet 50 mg  50 mg Oral QHS,MR X 1 Abeer Iversen E Mayes Sangiovanni, PA-C       PTA Medications: Prescriptions prior to admission   Medication Sig Dispense Refill Last Dose  . albuterol (PROVENTIL HFA;VENTOLIN HFA) 108 (90 BASE) MCG/ACT inhaler Inhale 2 puffs into the lungs every 6 (six) hours as needed for wheezing.   Unknown  . divalproex (DEPAKOTE) 500 MG DR tablet Take 1 tablet (500 mg total) by mouth at bedtime. 14 tablet 0 08/28/2015 at pm  . gabapentin (NEURONTIN) 300 MG capsule Take 1 capsule (300 mg total) by mouth 3 (three) times daily. 42 capsule 0 08/28/2015 at Unknown time  . risperiDONE (RISPERDAL) 0.5 MG tablet Take 1 tablet (0.5 mg total) by mouth daily. 14 tablet 0 08/28/2015 at Unknown time    Musculoskeletal: Strength & Muscle Tone: within normal limits Gait & Station: normal Patient leans: N/A  Psychiatric Specialty Exam: Physical Exam  Nursing note and vitals reviewed. Constitutional: He appears well-developed and well-nourished.  Skin: Skin is warm and dry.    Review of Systems  All other systems reviewed and are negative.   There were no vitals taken for this visit.There is no weight on file to calculate BMI.  General Appearance: Disheveled  Eye Contact::  Poor  Speech:  Garbled  Volume:  Normal  Mood:  Hopeless  Affect:  Congruent  Thought Process:  Goal Directed  Orientation:  Full (Time, Place, and Person)  Thought Content:  Negative  Suicidal Thoughts:  Yes.  without intent/plan  Homicidal Thoughts:  No  Memory:  Recent;   Fair  Judgement:  Impaired  Insight:  Lacking  Psychomotor Activity:  Decreased  Concentration:  Poor  Recall:  Poor  Fund of Knowledge:Good  Language: Fair  Akathisia:  Negative  Handed:  Right  AIMS (if indicated):     Assets:  Desire for Improvement  ADL's:  Intact  Cognition: WNL  Sleep:        Treatment Plan Summary: Plan Admit to [facility] Obs for crises intervention, safety and stabilization. TTS to evaluate in am to aid in disposition plans.  Observation Level/Precautions:  Continuous Observation  Laboratory:    Psychotherapy:    Medications:     Consultations:    Discharge Concerns:    Estimated LOS:  Other:     I certify that inpatient services furnished can reasonably be expected to improve the patient's condition.    Teanna Elem E, PA-C 3/27/201711:19 PM

## 2015-08-29 NOTE — ED Provider Notes (Signed)
CSN: [id number]     Arrival date & time 08/29/15  0830 History   First MD Initiated Contact with Patient 08/29/15 [id number]     Chief Complaint  Patient presents with  . Suicidal     (Consider location/radiation/quality/duration/timing/severity/associated sxs/prior Treatment) HPI Patient presents to the emergency department with suicidal ideations and drug abuse.  The patient states that he has been using cocaine and other drugs along with alcohol.  Patient states that this condition seems to have gotten worse over the last few days.  Patient states nothing seems make his condition, better.  Patient denies chest pain, shortness of breath, headache, blurred vision, back pain, neck pain, fever, abdominal pain, hallucinations, nausea, vomiting, diarrhea, near syncope or syncope Past Medical History  Diagnosis Date  . Bipolar 1 disorder ([facility])   . Heart murmur   . Depression    Past Surgical History  Procedure Laterality Date  . Back surgery  2011    had melanoma mole removed   Family History  Problem Relation Age of Onset  . Heart Problems Father    Social History  Substance Use Topics  . Smoking status: Current Every Day Smoker -- 1.00 packs/day for 5 years    Types: Cigarettes    Last Attempt to Quit: 06/04/2013  . Smokeless tobacco: Never Used  . Alcohol Use: Yes     Comment: occasionally sober 7 months    Review of Systems  All other systems negative except as documented in the HPI. All pertinent positives and negatives as reviewed in the HPI.=  Allergies  Review of patient's allergies indicates no known allergies.  Home Medications   Prior to Admission medications   Medication Sig Start Date End Date Taking? Authorizing Provider  albuterol (PROVENTIL HFA;VENTOLIN HFA) 108 (90 BASE) MCG/ACT inhaler Inhale 2 puffs into the lungs every 6 (six) hours as needed for wheezing.   Yes Historical Provider, MD  divalproex (DEPAKOTE) 500 MG DR tablet Take 1 tablet (500 mg total) by  mouth at bedtime. 08/27/15  Yes Thermon LeylandLaura A Davis, NP  gabapentin (NEURONTIN) 300 MG capsule Take 1 capsule (300 mg total) by mouth 3 (three) times daily. 08/27/15  Yes Thermon LeylandLaura A Davis, NP  risperiDONE (RISPERDAL) 0.5 MG tablet Take 1 tablet (0.5 mg total) by mouth daily. 08/27/15  Yes Thermon LeylandLaura A Davis, NP   BP 139/63 mmHg  Pulse 75  Temp(Src) 98.2 F (36.8 C) (Oral)  Resp 16  SpO2 100% Physical Exam  Constitutional: He is oriented to person, place, and time. He appears well-developed and well-nourished. No distress.  HENT:  Head: Normocephalic and atraumatic.  Mouth/Throat: Oropharynx is clear and moist.  Eyes: Pupils are equal, round, and reactive to light.  Neck: Normal range of motion. Neck supple.  Cardiovascular: Normal rate, regular rhythm and normal heart sounds.  Exam reveals no gallop and no friction rub.   No murmur heard. Pulmonary/Chest: Effort normal and breath sounds normal. No respiratory distress. He has no wheezes.  Neurological: He is alert and oriented to person, place, and time. He exhibits normal muscle tone. Coordination normal.  Skin: Skin is warm and dry. No rash noted. No erythema.  Psychiatric: His behavior is normal. His mood appears anxious. Thought content is not paranoid and not delusional. He exhibits a depressed mood. He expresses suicidal ideation. He expresses no homicidal ideation. He expresses suicidal plans. He expresses no homicidal plans.  Nursing note and vitals reviewed.   ED Course  Procedures (including critical care time) Labs Review  Labs Reviewed  ACETAMINOPHEN LEVEL - Abnormal; Notable for the following:    Acetaminophen (Tylenol), Serum <10 (*)    All other components within normal limits  URINE RAPID DRUG SCREEN, HOSP PERFORMED - Abnormal; Notable for the following:    Cocaine POSITIVE (*)    Benzodiazepines POSITIVE (*)    All other components within normal limits  COMPREHENSIVE METABOLIC PANEL  ETHANOL  SALICYLATE LEVEL  CBC    Patient  will need TTS assessment for suicidal ideation  Charlestine Night, PA-C 08/29/15 1617  Bethann Berkshire, MD 08/30/15 [phone]

## 2015-08-29 NOTE — ED Notes (Signed)
Patient [facility] that [facility] once medically [facility].

## 2015-08-29 NOTE — ED Notes (Signed)
[facility]

## 2015-08-29 NOTE — ED Notes (Signed)
Pt c/o suicidal ideation with plan and intent to walk in front of automobile traffic. Denies HI/AVH. Pt reports heavy use of alcohol, cocaine, and xanax pills, last use yesterday afternoon. Typically drinks 6-8 twenty four ounce beers per day. Pt reports episodes of amnesia/loosing time. Was treated here for the same 5 days ago, does not remember most of stay, cannot remember what happened to the medication prescription he was given. Pt worried about harming himself. Brought by police, who his father called after he passed out in father's garage.

## 2015-08-30 ENCOUNTER — Encounter (HOSPITAL_COMMUNITY): Payer: Self-pay

## 2015-08-30 DIAGNOSIS — F1994 Other psychoactive substance use, unspecified with psychoactive substance-induced mood disorder: Secondary | ICD-10-CM | POA: Diagnosis not present

## 2015-08-30 DIAGNOSIS — F419 Anxiety disorder, unspecified: Secondary | ICD-10-CM | POA: Diagnosis not present

## 2015-08-30 NOTE — [facility] Counselor (Signed)
Pt expressed not interested in other treatment than [facility], and may consider [facility]. Pt declined [facility] [at first], [facility] and others local. Pt insistent on returning to [facility] and appears to be in pre-contemplative stage of change. Pt unwilling to accept outpt. Care currently.  Quincey Nored K. Sherlon HandingHarris, LCAS-A, LPC-A, [facility]  Counselor 08/30/2015 12:28 PM

## 2015-08-30 NOTE — Progress Notes (Signed)
[facility] Progress Note  08/30/2015 2:04 PM [name]  MRN:  [id number] Subjective:   Patient states "I ended up on the streets. I had nowhere to go after I left here and could not return back to the [facility]. I got to using cocaine and drinking. I can't be here in [place] or I will die. I need to get into the [facility]. I would be able to get myself there. I don't want to be admitted to [facility] or [facility] because they are just too close to [place]. I'm not as suicidal as I was. I need help for my substance abuse."   Objective:   Patient seen and chart is reviewed. [name] is a 35 year male who was recently released from the [facility] Unit on 08/26/2015 with a plan to go back to his [facility]. Patient reports that he became homeless which resulted in him using cocaine. His current urine drug screen is positive for cocaine and benzo. His alcohol level was negative. [name] is now requesting inpatient residential treatment but will only consider going back to [place] despite having upcoming court dates in [place]. Patient declines any outpatient treatment stating "I am way past outpatient with how serious my problem is." The patient does not appear to be experiencing any withdrawal symptoms and is now denying suicidal ideation. He reported a previous plan in the ED to walk into traffic.   Principal Problem: Substance induced mood disorder ([place]) Diagnosis:   Patient Active Problem List   Diagnosis Date Noted  . Substance induced mood disorder ([place]) [F19.94] 08/29/2015  . Cocaine dependence ([place]) [F14.20] 08/26/2015  . Bipolar affective disorder, currently depressed, mild ([place]) [F31.31] 08/26/2015  . Bipolar affective disorder ([place]) [F31.9] 11/02/2012   Total Time spent with patient: 20 minutes  Past Psychiatric History: See H & P  Past Medical History:  Past Medical History  Diagnosis Date  . Bipolar 1 disorder ([place])   . Heart  murmur   . Depression     Past Surgical History  Procedure Laterality Date  . Back surgery  2011    had melanoma mole removed   Family History:  Family History  Problem Relation Age of Onset  . Heart Problems Father    Family Psychiatric  History: See H & P Social History:  History  Alcohol Use  . Yes    Comment: occasionally sober 7 months     History  Drug Use  . Yes  . Special: Cocaine    Comment: opiates- currently in [facility] (11/18/14) clean 7 months    Social History   Social History  . Marital Status: Single    Spouse Name: N/A  . Number of Children: N/A  . Years of Education: N/A   Social History Main Topics  . Smoking status: Current Every Day Smoker -- 1.00 packs/day for 5 years    Types: Cigarettes    Last Attempt to Quit: 06/04/2013  . Smokeless tobacco: Never Used  . Alcohol Use: Yes     Comment: occasionally sober 7 months  . Drug Use: Yes    Special: Cocaine     Comment: opiates- currently in treatment at [facility] (11/18/14) clean 7 months  . Sexual Activity: Not Currently   Other Topics Concern  . None   Social History Narrative   Additional Social History:    Pain Medications: pt denies abuse Prescriptions: pt reports rare use of xanax Over the Counter: pt denies abuse History  of alcohol / drug use?: Yes Longest period of sobriety (when/how long): 7 months Negative Consequences of Use: Financial, Legal, Personal relationships, Work / School Withdrawal Symptoms: Agitation, Blackouts, Sweats, Irritability, Tremors Name of Substance 1: alcohol 1 - Age of First Use: 16 1 - Amount (size/oz): six to eight 24-oz beers 1 - Frequency: every other day 1 - Duration: months now 1 - Last Use / Amount: 3/26-17 - six to eight 24-oz beers and two mixed drinks Name of Substance 2: cocaine - powder 2 - Age of First Use: 16 2 - Amount (size/oz): 8 ball 2 - Frequency: twice 2 - Duration: four day binge 2 - Last Use / Amount: 08/28/15 - $500  worth Name of Substance 3: xanax 3 - Frequency: rarely 3 - Last Use / Amount: 08/28/15               Sleep: Fair  Appetite:  Fair  Current Medications: Current Facility-Administered Medications  Medication Dose Route Frequency Provider Last Rate Last Dose  . acetaminophen (TYLENOL) tablet 650 mg  650 mg Oral Q6H PRN Laverle Hobby, PA-C      . albuterol (PROVENTIL HFA;VENTOLIN HFA) 108 (90 Base) MCG/ACT inhaler 2 puff  2 puff Inhalation Q6H PRN Laverle Hobby, PA-C      . alum & mag hydroxide-simeth (MAALOX/MYLANTA) 200-200-20 MG/5ML suspension 30 mL  30 mL Oral Q4H PRN Laverle Hobby, PA-C      . dicyclomine (BENTYL) tablet 20 mg  20 mg Oral Q6H PRN Laverle Hobby, PA-C      . divalproex (DEPAKOTE) DR tablet 500 mg  500 mg Oral QHS Laverle Hobby, PA-C   500 mg at 08/30/15 0047  . gabapentin (NEURONTIN) capsule 300 mg  300 mg Oral TID Laverle Hobby, PA-C   300 mg at 08/30/15 1342  . hydrOXYzine (ATARAX/VISTARIL) tablet 25 mg  25 mg Oral Q6H PRN Laverle Hobby, PA-C   25 mg at 08/30/15 0047  . loperamide (IMODIUM) capsule 2-4 mg  2-4 mg Oral PRN Laverle Hobby, PA-C      . magnesium hydroxide (MILK OF MAGNESIA) suspension 30 mL  30 mL Oral Daily PRN Laverle Hobby, PA-C      . methocarbamol (ROBAXIN) tablet 500 mg  500 mg Oral Q8H PRN Laverle Hobby, PA-C      . naproxen (NAPROSYN) tablet 500 mg  500 mg Oral BID PRN Laverle Hobby, PA-C      . ondansetron (ZOFRAN-ODT) disintegrating tablet 4 mg  4 mg Oral Q6H PRN Laverle Hobby, PA-C      . risperiDONE (RISPERDAL) tablet 0.5 mg  0.5 mg Oral Daily Laverle Hobby, PA-C   0.5 mg at 08/30/15 7425  . traZODone (DESYREL) tablet 50 mg  50 mg Oral QHS,MR X 1 Laverle Hobby, PA-C        Lab Results:  Results for orders placed or performed during the hospital encounter of 08/29/15 (from the past 48 hour(s))  Comprehensive metabolic panel     Status: None   Collection Time: 08/29/15  9:13 AM  Result Value Ref Range   Sodium  141 135 - 145 mmol/L   Potassium 4.1 3.5 - 5.1 mmol/L   Chloride 103 101 - 111 mmol/L   CO2 29 22 - 32 mmol/L   Glucose, Bld 99 65 - 99 mg/dL   BUN 12 6 - 20 mg/dL   Creatinine, Ser 1.12 0.61 - 1.24 mg/dL  Calcium 9.5 8.9 - 10.3 mg/dL   Total Protein 7.9 6.5 - 8.1 g/dL   Albumin 4.6 3.5 - 5.0 g/dL   AST 20 15 - 41 U/L   ALT 23 17 - 63 U/L   Alkaline Phosphatase 62 38 - 126 U/L   Total Bilirubin 0.9 0.3 - 1.2 mg/dL   GFR calc non Af Amer >60 >60 mL/min   GFR calc Af Amer >60 >60 mL/min    Comment: (NOTE) The eGFR has been calculated using the CKD EPI equation. This calculation has not been validated in all clinical situations. eGFR's persistently <60 mL/min signify possible Chronic Kidney Disease.    Anion gap 9 5 - 15  CBC     Status: None   Collection Time: 08/29/15  9:13 AM  Result Value Ref Range   WBC 9.8 4.0 - 10.5 K/uL   RBC 4.92 4.22 - 5.81 MIL/uL   Hemoglobin 15.4 13.0 - 17.0 g/dL   HCT 48.5 46.2 - 70.3 %   MCV 93.9 78.0 - 100.0 fL   MCH 31.3 26.0 - 34.0 pg   MCHC 33.3 30.0 - 36.0 g/dL   RDW 50.0 93.8 - 18.2 %   Platelets 231 150 - 400 K/uL  Ethanol (ETOH)     Status: None   Collection Time: 08/29/15  9:15 AM  Result Value Ref Range   Alcohol, Ethyl (B) <5 <5 mg/dL    Comment:        LOWEST DETECTABLE LIMIT FOR SERUM ALCOHOL IS 5 mg/dL FOR MEDICAL PURPOSES ONLY   Salicylate level     Status: None   Collection Time: 08/29/15  9:15 AM  Result Value Ref Range   Salicylate Lvl <4.0 2.8 - 30.0 mg/dL  Acetaminophen level     Status: Abnormal   Collection Time: 08/29/15  9:15 AM  Result Value Ref Range   Acetaminophen (Tylenol), Serum <10 (L) 10 - 30 ug/mL    Comment:        THERAPEUTIC CONCENTRATIONS VARY SIGNIFICANTLY. A RANGE OF 10-30 ug/mL MAY BE AN EFFECTIVE CONCENTRATION FOR MANY PATIENTS. HOWEVER, SOME ARE BEST TREATED AT CONCENTRATIONS OUTSIDE THIS RANGE. ACETAMINOPHEN CONCENTRATIONS >150 ug/mL AT 4 HOURS AFTER INGESTION AND >50 ug/mL AT  12 HOURS AFTER INGESTION ARE OFTEN ASSOCIATED WITH TOXIC REACTIONS.   Urine rapid drug screen (hosp performed) (Not at [facility])     Status: Abnormal   Collection Time: 08/29/15  9:17 AM  Result Value Ref Range   Opiates NONE DETECTED NONE DETECTED   Cocaine POSITIVE (A) NONE DETECTED   Benzodiazepines POSITIVE (A) NONE DETECTED   Amphetamines NONE DETECTED NONE DETECTED   Tetrahydrocannabinol NONE DETECTED NONE DETECTED   Barbiturates NONE DETECTED NONE DETECTED    Comment:        DRUG SCREEN FOR MEDICAL PURPOSES ONLY.  IF CONFIRMATION IS NEEDED FOR ANY PURPOSE, NOTIFY LAB WITHIN 5 DAYS.        LOWEST DETECTABLE LIMITS FOR URINE DRUG SCREEN Drug Class       Cutoff (ng/mL) Amphetamine      1000 Barbiturate      200 Benzodiazepine   200 Tricyclics       300 Opiates          300 Cocaine          300 THC              50     Blood Alcohol level:  Lab Results  Component Value Date   ETH <5  08/29/2015   ETH 6* 08/26/2015    Physical Findings: AIMS: Facial and Oral Movements Muscles of Facial Expression: None, normal Lips and Perioral Area: None, normal Jaw: None, normal Tongue: None, normal,Extremity Movements Upper (arms, wrists, hands, fingers): None, normal Lower (legs, knees, ankles, toes): None, normal, Trunk Movements Neck, shoulders, hips: None, normal, Overall Severity Severity of abnormal movements (highest score from questions above): None, normal Incapacitation due to abnormal movements: None, normal Patient's awareness of abnormal movements (rate only patient's report): No Awareness, Dental Status Current problems with teeth and/or dentures?: No Does patient usually wear dentures?: No  CIWA:  CIWA-Ar Total: 6 COWS:  COWS Total Score: 4  Musculoskeletal: Strength & Muscle Tone: within normal limits Gait & Station: normal Patient leans: N/A  Psychiatric Specialty Exam: Review of Systems  Psychiatric/Behavioral: Positive for depression and substance  abuse. Negative for suicidal ideas, hallucinations and memory loss. The patient is nervous/anxious. The patient does not have insomnia.     Blood pressure 119/72, pulse 61, temperature 98.4 F (36.9 C), temperature source Oral, resp. rate 18, height _0  (1.803 m), weight 85.276 kg (188 lb), SpO2 98 %.Body mass index is 26.23 kg/(m^2).  General Appearance: Casual  Eye Contact::  Fair  Speech:  Clear and Coherent  Volume:  Normal  Mood:  Depressed  Affect:  Constricted  Thought Process:  Goal Directed and Intact  Orientation:  Full (Time, Place, and Person)  Thought Content:  Symptoms, worries   Suicidal Thoughts:  No  Homicidal Thoughts:  No  Memory:  Immediate;   Good Recent;   Fair Remote;   Fair  Judgement:  Impaired  Insight:  Lacking  Psychomotor Activity:  Normal  Concentration:  Fair  Recall:  [employer] of Knowledge:Good  Language: Good  Akathisia:  No  Handed:  Right  AIMS (if indicated):     Assets:  Communication Skills Desire for Improvement Financial Resources/Insurance Leisure Time Resilience  ADL's:  Intact  Cognition: WNL  Sleep:      Treatment Plan Summary: Daily contact with patient to assess and evaluate symptoms and progress in treatment and Medication management  -Continue home medications for mood stabilization as ordered to include Depakote, Neurontin and Risperdal.  -Counselor to make referral to treatment center for cocaine dependence in [place].   Elmarie Shiley, NP 08/30/2015, 2:04 PM

## 2015-08-30 NOTE — Progress Notes (Signed)
Shift Assessment:  [name] up and walking around unit.  Pt is restless and mildly agitated.  Pt expresses hope that he will be given opportunity to attend in patient rehab program.  Pt asks if there is a program in [place].  [facility] and is leaving a message for one of the staff contacts.  Pt states he is hyperactive, nervous and says that his mind is racing.  Pt anxious about finding a suitable in patient program and sts he is very motivated to get "clean".  Pt sts he is mildly nauseous and sts he was not eating much solid food prior to admission.

## 2015-08-30 NOTE — Progress Notes (Signed)
Admission Note:  [name] admitted to Obs [facility] from [facility].  Patient was in obs about 2 days ago for substance abuse Induced Mood Disorder, bi polar affective disorder, cocaine abuse.  Pt sts he does not recall previous admit very well.  Sts he does recall tell MD that he was not ready to go home.  Pt sts he went to friends home and started doing drugs and ETOH again.  Pt uses Cocaine (8 ball), drinks Liquor, Beer and has taken xanax in the past.  Pt is emotional and sts he needs to get into an inpatient program away from this area.  Sts he wants to return to [place] where he was doing well.  Pt in bed asleep at this time, after shower.

## 2015-08-30 NOTE — BHH Counselor (Signed)
Patient agreeable to admission to Observation Unit for continuous observation promoting safety, stabilization and crises intervention until reevaluated by an extender in the AM. Further plans for disposition to follow.  Seeley [place] McNeil ,MA OBS Counselor  

## 2015-08-31 DIAGNOSIS — F1424 Cocaine dependence with cocaine-induced mood disorder: Secondary | ICD-10-CM | POA: Diagnosis not present

## 2015-08-31 DIAGNOSIS — F1994 Other psychoactive substance use, unspecified with psychoactive substance-induced mood disorder: Secondary | ICD-10-CM | POA: Diagnosis not present

## 2015-08-31 DIAGNOSIS — F419 Anxiety disorder, unspecified: Secondary | ICD-10-CM | POA: Diagnosis not present

## 2015-08-31 DIAGNOSIS — F3131 Bipolar disorder, current episode depressed, mild: Secondary | ICD-10-CM | POA: Diagnosis not present

## 2015-08-31 MED ORDER — RISPERIDONE 0.5 MG PO TABS: 0.5000 mg | ORAL_TABLET | Freq: Every day | ORAL | Status: DC

## 2015-08-31 MED ORDER — HYDROXYZINE HCL 25 MG PO TABS: 25.0000 mg | ORAL_TABLET | Freq: Four times a day (QID) | ORAL | Status: DC | PRN

## 2015-08-31 MED ORDER — GABAPENTIN 300 MG PO CAPS: 300.0000 mg | ORAL_CAPSULE | Freq: Three times a day (TID) | ORAL | Status: DC

## 2015-08-31 MED ORDER — TRAZODONE HCL 50 MG PO TABS: 50.0000 mg | ORAL_TABLET | Freq: Every evening | ORAL | Status: DC | PRN

## 2015-08-31 MED ORDER — DIVALPROEX SODIUM 500 MG PO DR TAB: 500.0000 mg | DELAYED_RELEASE_TABLET | Freq: Every day | ORAL | Status: DC

## 2015-08-31 NOTE — Progress Notes (Signed)
Nursing Shift Note  Patient denying any SI/HI/AVH , does admit to significant anxiety and depression still, contracts for safety on Unit in that he will share with staff first should he develop any such thoughts. Nurse administering medications per order, providing emotional support, therapeutic communciation and ensuring patiient is observed continuously for  Safety except when in the bathroom. Patient remains safe on Unit

## 2015-08-31 NOTE — BH Assessment (Signed)
BHH Assessment Progress Note  Patient will be discharged this date and follow up with outpatient resources provided. This Clinical research associatewriter assisted with contacting several residential programs in the area to include [facility] (admission pending bed availability), [facility] and [facility]. Patient's information is currently being reviewed by several facilities although has been declined from [facility] and [facility] due to patient not meeting criteria. This Clinical research associatewriter and patient also contacted [facility] [phone] to explore other available facilities. Patient will be discharged and encouraged to follow up with outpatient treatment until a appropriate residential placement can be located. Patient stated he will explore his options and was provided with outpatient programs/resources. Patient was also provided with relapse prevention information and the intensive outpatient program available at [facility] .

## 2015-08-31 NOTE — Discharge Instructions (Signed)
Patient will be discharged this date and follow up with outpatient resources provided. This Clinical research associatewriter assisted with contacting several residential programs in the area to include [facility] (admission pending bed availability), [facility] and [facility]. Patient's information is currently being reviewed by several facilities although has been declined from [facility] and [facility] due to patient not meeting criteria. This Clinical research associatewriter and patient also contacted [facility] [phone] to explore other available facilities. Patient will be discharged and encouraged to follow up with outpatient treatment until a appropriate residential placement can be located. Patient stated he will explore his options and was provided with outpatient programs/resources. Patient was also provided with relapse prevention information and the intensive outpatient program available at [facility] .

## 2015-08-31 NOTE — Progress Notes (Signed)
Nursing Discharge Note:  Patient is denying any SI/HI/AVH but still admits to signifiacant shame and depression in himself for having stayed clean for several years and then falling back into his substance abuse patterns. Patient receiving copy of [facility] Discharge Summary and also siggning a copy of same to remain on the chart. Patient also receiving his belongings and signing for them as well. Patient states understanding of medications and followup plans.  Staff member escorting patient to [facility]; patient is ambulatory discharge.

## 2015-08-31 NOTE — Progress Notes (Signed)
Nursing Shift Note:  Patient has slept much of the shift but has been awake and very restless, stating to Nurse that now that his head is clearing he is realizing what all he has lost due to his addiction. Patient states "I'm so sick of al of this s___ , coming here again, loosing everything I own", and is now extremely restless, nervous, and extremely worried about getting discharged to an inpatient Unit, which he states "is the only way I'm gonna make it". Patient does deny any active SI and contracts for safety. Patient denies any HI or AVH, is compliiant with medications. Nurse rpovding medications as ordered, emotional support, and therapeutic communication as well as ensuring continuous observation for patient on Unit. Patient remains safe on Unit.

## 2015-09-08 NOTE — Discharge Summary (Signed)
[facility] OBS UNIT DISCHARGE SUMMARY  08/31/2015 1:11 PM [name]  MRN:  [id number]  Subjective: Pt seen and chart reviewed. Pt is alert/oriented x4, calm, cooperative, and appropriate to situation. Pt denies suicidal/homicidal ideation and psychosis and does not appear to be responding to internal stimuli. Pt reports that he did have some fleeting suicidal thoughts and did have plan a couple days ago due to fears of relapsing on crack/cocaine. He initially stated that he would "absolutely not go to [facility] no matter what" but later reported he wanted to leave to get a ride there and will seek outpatient treatment.   Interval history 08/31/15:   Patient states "I ended up on the streets. I had nowhere to go after I left here and could not return back to the [facility]. I got to using cocaine and drinking. I can't be here in [place] or I will die. I need to get into the [facility]. I would be able to get myself there. I don't want to be admitted to [facility] or [facility] because they are just too close to [place]. I'm not as suicidal as I was. I need help for my substance abuse."   HPI Patient seen and chart is reviewed. [name] is a 35 year male who was recently released from the [facility] Unit on 08/26/2015 with a plan to go back to his [facility]. Patient reports that he became homeless which resulted in him using cocaine. His current urine drug screen is positive for cocaine and benzo. His alcohol level was negative. Deontre is now requesting inpatient residential treatment but will only consider going back to [place] despite having upcoming court dates in [place]. Patient declines any outpatient treatment stating "I am way past outpatient with how serious my problem is." The patient does not appear to be experiencing any withdrawal symptoms and is now denying suicidal ideation. He reported a previous plan in the ED to walk into traffic.   Principal Problem:  Substance induced mood disorder (HCC) Diagnosis:   Patient Active Problem List   Diagnosis Date Noted  . Substance induced mood disorder (HCC) [F19.94] 08/29/2015    Priority: High  . Cocaine dependence (HCC) [F14.20] 08/26/2015    Priority: High  . Bipolar affective disorder, currently depressed, mild (HCC) [F31.31] 08/26/2015    Priority: Medium  . Bipolar affective disorder (HCC) [F31.9] 11/02/2012   Total Time spent with patient: 25 minutes  Past Psychiatric History: See H & P  Past Medical History:  Past Medical History  Diagnosis Date  . Bipolar 1 disorder (HCC)   . Heart murmur   . Depression     Past Surgical History  Procedure Laterality Date  . Back surgery  2011    had melanoma mole removed   Family History:  Family History  Problem Relation Age of Onset  . Heart Problems Father    Family Psychiatric  History: See H & P Social History:  History  Alcohol Use  . Yes    Comment: occasionally sober 7 months     History  Drug Use  . Yes  . Special: Cocaine    Comment: opiates- currently in treatment at [facility] (11/18/14) clean 7 months    Social History   Social History  . Marital Status: Single    Spouse Name: N/A  . Number of Children: N/A  . Years of Education: N/A   Social History Main Topics  . Smoking status: Current Every Day Smoker -- 1.00 packs/day  for 5 years    Types: Cigarettes    Last Attempt to Quit: 06/04/2013  . Smokeless tobacco: Never Used  . Alcohol Use: Yes     Comment: occasionally sober 7 months  . Drug Use: Yes    Special: Cocaine     Comment: opiates- currently in treatment at [facility] (11/18/14) clean 7 months  . Sexual Activity: Not Currently   Other Topics Concern  . None   Social History Narrative   Additional Social History:    Pain Medications: pt denies abuse Prescriptions: pt reports rare use of xanax Over the Counter: pt denies abuse History of alcohol / drug use?: Yes Longest period of sobriety (when/how  long): 7 months Negative Consequences of Use: Financial, Armed forces operational officerLegal, Personal relationships, Work / Programmer, multimediachool Withdrawal Symptoms: Agitation, Blackouts, Sweats, Irritability, Tremors Name of Substance 1: alcohol 1 - Age of First Use: 16 1 - Amount (size/oz): six to eight 24-oz beers 1 - Frequency: every other day 1 - Duration: months now 1 - Last Use / Amount: 3/26-17 - six to eight 24-oz beers and two mixed drinks Name of Substance 2: cocaine - powder 2 - Age of First Use: 16 2 - Amount (size/oz): 8 ball 2 - Frequency: twice 2 - Duration: four day binge 2 - Last Use / Amount: 08/28/15 - $500 worth Name of Substance 3: xanax 3 - Frequency: rarely 3 - Last Use / Amount: 08/28/15               Sleep: Fair  Appetite:  Fair  Current Medications: Current Facility-Administered Medications  Medication Dose Route Frequency Provider Last Rate Last Dose  . acetaminophen (TYLENOL) tablet 650 mg  650 mg Oral Q6H PRN Kerry HoughSpencer E Simon, PA-C      . albuterol (PROVENTIL HFA;VENTOLIN HFA) 108 (90 Base) MCG/ACT inhaler 2 puff  2 puff Inhalation Q6H PRN Kerry HoughSpencer E Simon, PA-C      . alum & mag hydroxide-simeth (MAALOX/MYLANTA) 200-200-20 MG/5ML suspension 30 mL  30 mL Oral Q4H PRN Kerry HoughSpencer E Simon, PA-C      . dicyclomine (BENTYL) tablet 20 mg  20 mg Oral Q6H PRN Kerry HoughSpencer E Simon, PA-C      . divalproex (DEPAKOTE) DR tablet 500 mg  500 mg Oral QHS Kerry HoughSpencer E Simon, PA-C   500 mg at 08/30/15 2116  . gabapentin (NEURONTIN) capsule 300 mg  300 mg Oral TID Kerry HoughSpencer E Simon, PA-C   300 mg at 08/31/15 1206  . hydrOXYzine (ATARAX/VISTARIL) tablet 25 mg  25 mg Oral Q6H PRN Kerry HoughSpencer E Simon, PA-C   25 mg at 08/31/15 1206  . loperamide (IMODIUM) capsule 2-4 mg  2-4 mg Oral PRN Kerry HoughSpencer E Simon, PA-C      . magnesium hydroxide (MILK OF MAGNESIA) suspension 30 mL  30 mL Oral Daily PRN Kerry HoughSpencer E Simon, PA-C      . methocarbamol (ROBAXIN) tablet 500 mg  500 mg Oral Q8H PRN Kerry HoughSpencer E Simon, PA-C   500 mg at 08/31/15 0844  .  naproxen (NAPROSYN) tablet 500 mg  500 mg Oral BID PRN Kerry HoughSpencer E Simon, PA-C      . ondansetron (ZOFRAN-ODT) disintegrating tablet 4 mg  4 mg Oral Q6H PRN Kerry HoughSpencer E Simon, PA-C      . risperiDONE (RISPERDAL) tablet 0.5 mg  0.5 mg Oral Daily Kerry HoughSpencer E Simon, PA-C   0.5 mg at 08/31/15 0844  . traZODone (DESYREL) tablet 50 mg  50 mg Oral QHS,MR X 1 Kerry HoughSpencer E Simon, PA-C  50 mg at 08/30/15 2233    Lab Results:  No results found for this or any previous visit (from the past 48 hour(s)).  Blood Alcohol level:  Lab Results  Component Value Date   ETH <5 08/29/2015   ETH 6* 08/26/2015    Physical Findings: AIMS: Facial and Oral Movements Muscles of Facial Expression: None, normal Lips and Perioral Area: None, normal Jaw: None, normal Tongue: None, normal,Extremity Movements Upper (arms, wrists, hands, fingers): None, normal Lower (legs, knees, ankles, toes): None, normal, Trunk Movements Neck, shoulders, hips: None, normal, Overall Severity Severity of abnormal movements (highest score from questions above): None, normal Incapacitation due to abnormal movements: None, normal Patient's awareness of abnormal movements (rate only patient's report): No Awareness, Dental Status Current problems with teeth and/or dentures?: No Does patient usually wear dentures?: No  CIWA:  CIWA-Ar Total: 6 COWS:  COWS Total Score: 13  Musculoskeletal: Strength & Muscle Tone: within normal limits Gait & Station: normal Patient leans: N/A  Psychiatric Specialty Exam: Review of Systems  Psychiatric/Behavioral: Positive for depression and substance abuse. Negative for suicidal ideas, hallucinations and memory loss. The patient is nervous/anxious. The patient does not have insomnia.     Blood pressure 124/46, pulse 63, temperature 98.3 F (36.8 C), temperature source Oral, resp. rate 20, height  (1.803 m), weight 85.276 kg (188 lb), SpO2 99 %.Body mass index is 26.23 kg/(m^2).  General Appearance:  Casual  Eye Contact::  Good  Speech:  Clear and Coherent  Volume:  Normal  Mood:  Depressed  Affect:  Constricted  Thought Process:  Goal Directed, Linear and Logical  Orientation:  Full (Time, Place, and Person)  Thought Content:  Symptoms, worries concerns about homelessness  Suicidal Thoughts:  No  Homicidal Thoughts:  No  Memory:  Immediate;   Good Recent;   Fair Remote;   Fair  Judgement:  Fair  Insight:  Lacking  Psychomotor Activity:  Normal  Concentration:  Fair  Recall:  [employer] of Knowledge:Good  Language: Good  Akathisia:  No  Handed:  Right  AIMS (if indicated):     Assets:  Communication Skills Desire for Improvement Financial Resources/Insurance Leisure Time Resilience  ADL's:  Intact  Cognition: WNL  Sleep:      Treatment Plan Summary: Substance induced mood disorder (HCC), stable for outpatient treatment  Disposition -Discharge home with rx for 14 day supply of psychotropics  Beau Fanny, FNP 08/31/2015, 1:11 PM

## 2021-10-08 ENCOUNTER — Emergency Department (HOSPITAL_COMMUNITY)
Admission: EM | Admit: 2021-10-08 | Discharge: 2021-10-11 | Disposition: A | Discharge status: Home / Self Care | Payer: Medicare HMO | Attending: Student | Admitting: Student

## 2021-10-08 ENCOUNTER — Other Ambulatory Visit: Payer: Self-pay

## 2021-10-08 ENCOUNTER — Ambulatory Visit (HOSPITAL_COMMUNITY)
Admission: AD | Admit: 2021-10-08 | Discharge: 2021-10-08 | Disposition: A | Discharge status: Home / Self Care | Payer: Medicare Other | Attending: Psychiatry | Admitting: Psychiatry

## 2021-10-08 ENCOUNTER — Encounter (HOSPITAL_COMMUNITY): Payer: Self-pay

## 2021-10-08 DIAGNOSIS — F1994 Other psychoactive substance use, unspecified with psychoactive substance-induced mood disorder: Secondary | ICD-10-CM | POA: Diagnosis present

## 2021-10-08 DIAGNOSIS — R451 Restlessness and agitation: Secondary | ICD-10-CM | POA: Diagnosis present

## 2021-10-08 DIAGNOSIS — F1914 Other psychoactive substance abuse with psychoactive substance-induced mood disorder: Secondary | ICD-10-CM | POA: Insufficient documentation

## 2021-10-08 DIAGNOSIS — F309 Manic episode, unspecified: Secondary | ICD-10-CM | POA: Insufficient documentation

## 2021-10-08 DIAGNOSIS — F1513 Other stimulant abuse with withdrawal: Secondary | ICD-10-CM | POA: Insufficient documentation

## 2021-10-08 DIAGNOSIS — D72829 Elevated white blood cell count, unspecified: Secondary | ICD-10-CM | POA: Insufficient documentation

## 2021-10-08 DIAGNOSIS — F1510 Other stimulant abuse, uncomplicated: Secondary | ICD-10-CM

## 2021-10-08 DIAGNOSIS — F17210 Nicotine dependence, cigarettes, uncomplicated: Secondary | ICD-10-CM | POA: Insufficient documentation

## 2021-10-08 DIAGNOSIS — F1910 Other psychoactive substance abuse, uncomplicated: Secondary | ICD-10-CM

## 2021-10-08 DIAGNOSIS — F15988 Other stimulant use, unspecified with other stimulant-induced disorder: Secondary | ICD-10-CM | POA: Insufficient documentation

## 2021-10-08 DIAGNOSIS — R000 Tachycardia, unspecified: Secondary | ICD-10-CM | POA: Diagnosis not present

## 2021-10-08 LAB — COMPREHENSIVE METABOLIC PANEL
ALT: 52 U/L — ABNORMAL HIGH (ref 0–44)
AST: 44 U/L — ABNORMAL HIGH (ref 15–41)
Albumin: 4.6 g/dL (ref 3.5–5.0)
Alkaline Phosphatase: 59 U/L (ref 38–126)
Anion gap: 11 (ref 5–15)
BUN: 19 mg/dL (ref 6–20)
CO2: 23 mmol/L (ref 22–32)
Calcium: 9 mg/dL (ref 8.9–10.3)
Chloride: 102 mmol/L (ref 98–111)
Creatinine, Ser: 1.08 mg/dL (ref 0.61–1.24)
GFR, Estimated: >60 mL/min (ref >60)
Glucose, Bld: 101 mg/dL — ABNORMAL HIGH (ref 70–99)
Potassium: 3.7 mmol/L (ref 3.5–5.1)
Sodium: 136 mmol/L (ref 135–145)
Total Bilirubin: 2 mg/dL — ABNORMAL HIGH (ref 0.3–1.2)
Total Protein: 7.8 g/dL (ref 6.5–8.1)

## 2021-10-08 LAB — SALICYLATE LEVEL: Salicylate Lvl: <7 mg/dL — ABNORMAL LOW (ref 7.0–30.0)

## 2021-10-08 LAB — RAPID URINE DRUG SCREEN, HOSP PERFORMED
Amphetamines: POSITIVE — AB
Barbiturates: NOT DETECTED
Benzodiazepines: POSITIVE — AB
Cocaine: NOT DETECTED
Opiates: NOT DETECTED
Tetrahydrocannabinol: NOT DETECTED

## 2021-10-08 LAB — CBC WITH DIFFERENTIAL/PLATELET
Abs Immature Granulocytes: 0.03 10*3/uL (ref 0.00–0.07)
Basophils Absolute: 0 10*3/uL (ref 0.0–0.1)
Basophils Relative: 0 %
Eosinophils Absolute: 0.1 10*3/uL (ref 0.0–0.5)
Eosinophils Relative: 1 %
HCT: 42.1 % (ref 39.0–52.0)
Hemoglobin: 14.5 g/dL (ref 13.0–17.0)
Immature Granulocytes: 0 %
Lymphocytes Relative: 17 %
Lymphs Abs: 2.1 10*3/uL (ref 0.7–4.0)
MCH: 31.2 pg (ref 26.0–34.0)
MCHC: 34.4 g/dL (ref 30.0–36.0)
MCV: 90.5 fL (ref 80.0–100.0)
Monocytes Absolute: 1.8 10*3/uL — ABNORMAL HIGH (ref 0.1–1.0)
Monocytes Relative: 14 %
Neutro Abs: 8.6 10*3/uL — ABNORMAL HIGH (ref 1.7–7.7)
Neutrophils Relative %: 68 %
Platelets: 247 10*3/uL (ref 150–400)
RBC: 4.65 MIL/uL (ref 4.22–5.81)
RDW: 12.9 % (ref 11.5–15.5)
WBC: 12.6 10*3/uL — ABNORMAL HIGH (ref 4.0–10.5)
nRBC: 0 % (ref 0.0–0.2)

## 2021-10-08 LAB — ACETAMINOPHEN LEVEL: Acetaminophen (Tylenol), Serum: <10 ug/mL — ABNORMAL LOW (ref 10–30)

## 2021-10-08 LAB — ETHANOL: Alcohol, Ethyl (B): <10 mg/dL (ref <10)

## 2021-10-08 MED ORDER — LORAZEPAM 1 MG PO TABS
2.0000 mg | ORAL_TABLET | Freq: Once | ORAL | Status: AC
  Administered 2021-10-08: 2 mg via ORAL
  Filled 2021-10-08: qty 2

## 2021-10-08 MED ORDER — ZIPRASIDONE MESYLATE 20 MG IM SOLR
20.0000 mg | Freq: Once | INTRAMUSCULAR | Status: AC
  Administered 2021-10-08: 20 mg via INTRAMUSCULAR
  Filled 2021-10-08: qty 20

## 2021-10-08 MED ORDER — LORAZEPAM 2 MG/ML IJ SOLN
2.0000 mg | Freq: Once | INTRAMUSCULAR | Status: AC
  Administered 2021-10-08: 2 mg via INTRAMUSCULAR
  Filled 2021-10-08: qty 1

## 2021-10-08 MED ORDER — LORAZEPAM 1 MG PO TABS
2.0000 mg | ORAL_TABLET | ORAL | Status: DC | PRN
Start: 2021-10-08 — End: 2021-10-10
  Administered 2021-10-08 – 2021-10-09 (×3): 2 mg via ORAL
  Filled 2021-10-08 (×3): qty 2

## 2021-10-08 NOTE — ED Provider Notes (Signed)
?[facility]-EMERGENCY DEPT ?Provider Note ? ?CSN: [id number] ?Arrival date & time: 10/08/21 0849 ? ?Chief Complaint(s) ?Psychiatric Evaluation ? ?HPI ?[name] is a 41 y.o. male with PMH bipolar 1 disorder, substance-induced mood disorder, methamphetamine and crack cocaine use who presents emergency department via IVC and law enforcement due to family concern for mania.  Patient has been on a methamphetamine bender for the last several days and has not slept.  Patient arrives tachycardic, agitated, with dilated pupils and pressured speech.  Per IVC, patient family concern for hallucinations.  Patient currently denies SI, HI ? ? ?Past Medical History ?Past Medical History:  ?Diagnosis Date  ? Bipolar 1 disorder (HCC)   ? Depression   ? Heart murmur   ? ?Patient Active Problem List  ? Diagnosis Date Noted  ? Substance induced mood disorder (HCC) 08/29/2015  ? Cocaine dependence (HCC) 08/26/2015  ? Bipolar affective disorder, currently depressed, mild (HCC) 08/26/2015  ? Bipolar affective disorder (HCC) 11/02/2012  ? ?Home Medication(s) ?Prior to Admission medications   ?Medication Sig Start Date End Date Taking? Authorizing Provider  ?albuterol (PROVENTIL HFA;VENTOLIN HFA) 108 (90 BASE) MCG/ACT inhaler Inhale 2 puffs into the lungs every 6 (six) hours as needed for wheezing.    [provider]  ?divalproex (DEPAKOTE) 500 MG DR tablet Take 1 tablet (500 mg total) by mouth at bedtime. 08/31/15   Withrow, Everardo AllJohn C, FNP  ?gabapentin (NEURONTIN) 300 MG capsule Take 1 capsule (300 mg total) by mouth 3 (three) times daily. 08/31/15   Withrow, Everardo AllJohn C, FNP  ?hydrOXYzine (ATARAX/VISTARIL) 25 MG tablet Take 1 tablet (25 mg total) by mouth every 6 (six) hours as needed for anxiety. 08/31/15   Withrow, Everardo AllJohn C, FNP  ?risperiDONE (RISPERDAL) 0.5 MG tablet Take 1 tablet (0.5 mg total) by mouth daily. 08/31/15   Withrow, Everardo AllJohn C, FNP  ?traZODone (DESYREL) 50 MG tablet Take 1 tablet (50 mg total) by mouth at  bedtime and may repeat dose one time if needed. 08/31/15   Withrow, Everardo AllJohn C, FNP  ?                                                                                                                                  ?Past Surgical History ?Past Surgical History:  ?Procedure Laterality Date  ? BACK SURGERY  2011  ? had melanoma mole removed  ? ?Family History ?Family History  ?Problem Relation Age of Onset  ? Heart Problems Father   ? ? ?Social History ?Social History  ? ?Tobacco Use  ? Smoking status: Every Day  ?  Packs/day: 1.00  ?  Years: 5.00  ?  Pack years: 5.00  ?  Types: Cigarettes  ?  Last attempt to quit: 06/04/2013  ?  Years since quitting: 8.3  ? Smokeless tobacco: Never  ?Substance Use Topics  ? Alcohol use: Yes  ?  Comment: occasionally sober 7 months  ? Drug  use: Yes  ?  Types: Cocaine  ?  Comment: opiates- currently in treatment at [facility] (11/18/14) clean 7 months  ? ?Allergies ?Patient has no known allergies. ? ?Review of Systems ?Review of Systems  ?Psychiatric/Behavioral:  Positive for hallucinations. The patient is nervous/anxious and is hyperactive.   ? ?Physical Exam ?Vital Signs  ?I have reviewed the triage vital signs ?BP (!) 147/69 (BP Location: Left Arm)   Pulse 80   Temp 98 ?F (36.7 ?C) (Axillary)   Resp 20   Ht 5\' 11"  (1.803 m)   Wt 86.2 kg   SpO2 100%   BMI 26.50 kg/m?  ? ?Physical Exam ?Constitutional:   ?   General: He is not in acute distress. ?   Appearance: Normal appearance.  ?HENT:  ?   Head: Normocephalic and atraumatic.  ?   Nose: No congestion or rhinorrhea.  ?Eyes:  ?   General:     ?   Right eye: No discharge.     ?   Left eye: No discharge.  ?   Extraocular Movements: Extraocular movements intact.  ?   Pupils: Pupils are equal, round, and reactive to light.  ?   Comments: Dilated pupils  ?Cardiovascular:  ?   Rate and Rhythm: Regular rhythm. Tachycardia present.  ?   Heart sounds: No murmur heard. ?Pulmonary:  ?   Effort: No respiratory distress.  ?   Breath sounds: No  wheezing or rales.  ?Abdominal:  ?   General: There is no distension.  ?   Tenderness: There is no abdominal tenderness.  ?Musculoskeletal:     ?   General: Normal range of motion.  ?   Cervical back: Normal range of motion.  ?Skin: ?   General: Skin is warm and dry.  ?Neurological:  ?   General: No focal deficit present.  ?   Mental Status: He is alert.  ?   Comments: Inability to sit still, pacing  ? ? ?ED Results and Treatments ?Labs ?(all labs ordered are listed, but only abnormal results are displayed) ?Labs Reviewed  ?COMPREHENSIVE METABOLIC PANEL - Abnormal; Notable for the following components:  ?    Result Value  ? Glucose, Bld 101 (*)   ? AST 44 (*)   ? ALT 52 (*)   ? Total Bilirubin 2.0 (*)   ? All other components within normal limits  ?SALICYLATE LEVEL - Abnormal; Notable for the following components:  ? Salicylate Lvl <7.0 (*)   ? All other components within normal limits  ?ACETAMINOPHEN LEVEL - Abnormal; Notable for the following components:  ? Acetaminophen (Tylenol), Serum <10 (*)   ? All other components within normal limits  ?CBC WITH DIFFERENTIAL/PLATELET - Abnormal; Notable for the following components:  ? WBC 12.6 (*)   ? Neutro Abs 8.6 (*)   ? Monocytes Absolute 1.8 (*)   ? All other components within normal limits  ?ETHANOL  ?RAPID URINE DRUG SCREEN, HOSP PERFORMED  ?CBG MONITORING, ED  ?                                                                                                                       ? ?  Radiology ?No results found. ? ?Pertinent labs & imaging results that were available during my care of the patient were reviewed by me and considered in my medical decision making (see MDM for details). ? ?Medications Ordered in ED ?Medications  ?LORazepam (ATIVAN) tablet 2 mg (2 mg Oral Given 10/08/21 0909)  ?LORazepam (ATIVAN) injection 2 mg (2 mg Intramuscular Given 10/08/21 1051)  ?                                                               ?                                                                     ?Procedures ?Procedures ? ?(including critical care time) ? ?Medical Decision Making / ED Course ? ? ?This patient presents to the ED for concern of agitation and hallucinations, this involves an extensive number of treatment options, and is a complaint that carries with it a high risk of complications and morbidity.  The differential diagnosis includes psychosis, sympathomimetic toxicity, polysubstance abuse, electrolyte abnormality ? ?MDM: ?Patient seen emergency room for evaluation of agitation and hallucinations.  Patient arrives by IVC.  Physical exam reveals a hyperactive patient with dilated pupils bilaterally consistent with likely meth use.  Patient was given oral and IV Ativan due to persistent agitation.  Evaluation with a leukocytosis of 12.6 likely stress demargination in the setting of active substance abuse but laboratory evaluation otherwise unremarkable.  Patient medically clear for psychiatric evaluation.  TTS recommendations are pending and they have been consulted. ? ? ?Additional history obtained: ? ?-External records from outside source obtained and reviewed including: Chart review including previous notes, labs, imaging, consultation notes ? ? ?Lab Tests: ?-I ordered, reviewed, and interpreted labs.   ?The pertinent results include:   ?Labs Reviewed  ?COMPREHENSIVE METABOLIC PANEL - Abnormal; Notable for the following components:  ?    Result Value  ? Glucose, Bld 101 (*)   ? AST 44 (*)   ? ALT 52 (*)   ? Total Bilirubin 2.0 (*)   ? All other components within normal limits  ?SALICYLATE LEVEL - Abnormal; Notable for the following components:  ? Salicylate Lvl <7.0 (*)   ? All other components within normal limits  ?ACETAMINOPHEN LEVEL - Abnormal; Notable for the following components:  ? Acetaminophen (Tylenol), Serum <10 (*)   ? All other components within normal limits  ?CBC WITH DIFFERENTIAL/PLATELET - Abnormal; Notable for the following components:  ? WBC 12.6 (*)   ?  Neutro Abs 8.6 (*)   ? Monocytes Absolute 1.8 (*)   ? All other components within normal limits  ?ETHANOL  ?RAPID URINE DRUG SCREEN, HOSP PERFORMED  ?CBG MONITORING, ED  ?  ? ? ?EKG  ? EKG Interpretation ? ?Date

## 2021-10-08 NOTE — ED Notes (Signed)
Pt father Boomer Winders called and stated they were looking into [facility] for rehab. He also stated pt goes by [place]. ?

## 2021-10-08 NOTE — ED Notes (Signed)
Pt will not stay still to collect vital signs. ?

## 2021-10-08 NOTE — Consult Note (Signed)
Unable to assess patient due restlessness, tossing around and unable to sit still.  He is not able to engage in any assessment- he is withdrawing from Meth amphetamine.  Currently medicated to calm him down. ? ?

## 2021-10-08 NOTE — ED Notes (Signed)
Pt awake and food warmed for pt. ?

## 2021-10-08 NOTE — Consult Note (Addendum)
[facility] Face-to-Face Psychiatry Consult  ? ?Reason for Consult:  Psychiatric evaluation ?Referring Physician:  ER Physician. ?Patient Identification: [name] ?MRN:  [id number] ?Principal Diagnosis: Substance induced mood disorder (HCC) ?Diagnosis:  Principal Problem: ?  Substance induced mood disorder (HCC) ? ? ?Total Time spent with patient: 30 minutes ? ?Subjective:   ?[name] is a 41 y.o. male patient admitted with Hx significant for  Bipolar 1 disorder, substance-induced mood disorder, methamphetamine and crack cocaine use brought in by his father for Manic symptoms/Amphetamine withdrawal symptoms.  Patient was brought in under IVC and was on Amphetamine binge for three days before coming in to the hospital ? ?HPI:  a 41 y.o. male patient admitted with Hx significant for  Bipolar 1 disorder, substance-induced mood disorder, methamphetamine and crack cocaine use brought in by his father for Manic symptoms/Amphetamine withdrawal symptoms.  Patient was brought in under IVC and hwas on Amphetamine binge for three days before coming in to the hospital.  Since his arrival to the hospital patient has been restless, agitated, tossing around, unable to sit or sleep.  He required 2 doses of injection Ativan 2 mg each before he was able to lay down and got some sleep.  He is awake and calmer but cannot sit or lay down for five minutes without getting up.  Patient reported that he has been to several inpatient long term rehabs but relapses.  He also stated that he does go through withdrawal but this one is worse than previous ones.  He plans and is working with his dad to get into [facility] in [place] as soon as possible.  Father is not sure who his Psychiatrist is and if he is taking medications.  He denies SI/HI at this time.  We will continue to monitor and provide safety environment due to restlessness.  We will reevaluate in am. ?Provider spoke to his Father [name] who  confirmed arrangement is  being made to send patient [facility] and hopes that patient will stay this time and complete treatment.  [name] stated that patient have had several inpatient programs in the past but comes early or finishes program and relapse after a month. ?Past Psychiatric History: Hx significant for  Bipolar 1 disorder, substance-induced mood disorder, methamphetamine and crack cocaine use.  Patient has been to 10-12 detox programs and relapses few weeks after treatment.  Some programs he does not complete and leave. ? ?Risk to Self:   ?Risk to Others:   ?Prior Inpatient Therapy:   ?Prior Outpatient Therapy:   ? ?Past Medical History:  ?Past Medical History:  ?Diagnosis Date  ? Bipolar 1 disorder (HCC)   ? Depression   ? Heart murmur   ?  ?Past Surgical History:  ?Procedure Laterality Date  ? BACK SURGERY  2011  ? had melanoma mole removed  ? ?Family History:  ?Family History  ?Problem Relation Age of Onset  ? Heart Problems Father   ? ?Family Psychiatric  History: unknown ?Social History:  ?Social History  ? ?Substance and Sexual Activity  ?Alcohol Use Yes  ? Comment: occasionally sober 7 months  ?   ?Social History  ? ?Substance and Sexual Activity  ?Drug Use Yes  ? Types: Cocaine  ? Comment: opiates- currently in [facility] (11/18/14) clean 7 months  ?  ?Social History  ? ?Socioeconomic History  ? Marital status: Single  ?  Spouse name: Not on file  ? Number of children:  Not on file  ? Years of education: Not on file  ? Highest education level: Not on file  ?Occupational History  ? Not on file  ?Tobacco Use  ? Smoking status: Every Day  ?  Packs/day: 1.00  ?  Years: 5.00  ?  Pack years: 5.00  ?  Types: Cigarettes  ?  Last attempt to quit: 06/04/2013  ?  Years since quitting: 8.3  ? Smokeless tobacco: Never  ?Substance and Sexual Activity  ? Alcohol use: Yes  ?  Comment: occasionally sober 7 months  ? Drug use: Yes  ?  Types: Cocaine  ?  Comment: opiates- currently in treatment at [facility]  (11/18/14) clean 7 months  ? Sexual activity: Not Currently  ?Other Topics Concern  ? Not on file  ?Social History Narrative  ? Not on file  ? ?Social Determinants of Health  ? ?Financial Resource Strain: Not on file  ?Food Insecurity: Not on file  ?Transportation Needs: Not on file  ?Physical Activity: Not on file  ?Stress: Not on file  ?Social Connections: Not on file  ? ?Additional Social History: ?  ? ?Allergies:  No Known Allergies ? ?Labs:  ?Results for orders placed or performed during the hospital encounter of 10/08/21 (from the past 48 hour(s))  ?Comprehensive metabolic panel     Status: Abnormal  ? Collection Time: 10/08/21  9:00 AM  ?Result Value Ref Range  ? Sodium 136 135 - 145 mmol/L  ? Potassium 3.7 3.5 - 5.1 mmol/L  ? Chloride 102 98 - 111 mmol/L  ? CO2 23 22 - 32 mmol/L  ? Glucose, Bld 101 (H) 70 - 99 mg/dL  ?  Comment: Glucose reference range applies only to samples taken after fasting for at least 8 hours.  ? BUN 19 6 - 20 mg/dL  ? Creatinine, Ser 1.08 0.61 - 1.24 mg/dL  ? Calcium 9.0 8.9 - 10.3 mg/dL  ? Total Protein 7.8 6.5 - 8.1 g/dL  ? Albumin 4.6 3.5 - 5.0 g/dL  ? AST 44 (H) 15 - 41 U/L  ? ALT 52 (H) 0 - 44 U/L  ? Alkaline Phosphatase 59 38 - 126 U/L  ? Total Bilirubin 2.0 (H) 0.3 - 1.2 mg/dL  ? GFR, Estimated >60 >60 mL/min  ?  Comment: (NOTE) ?Calculated using the CKD-EPI Creatinine Equation (2021) ?  ? Anion gap 11 5 - 15  ?  Comment: Performed at [facility], [address], [place], [place] [address]  ?Salicylate level     Status: Abnormal  ? Collection Time: 10/08/21  9:00 AM  ?Result Value Ref Range  ? Salicylate Lvl <7.0 (L) 7.0 - 30.0 mg/dL  ?  Comment: Performed at [facility], [address], [place], [place] [address]  ?Acetaminophen level     Status: Abnormal  ? Collection Time: 10/08/21  9:00 AM  ?Result Value Ref Range  ? Acetaminophen (Tylenol), Serum <10 (L) 10 - 30 ug/mL  ?  Comment: (NOTE) ?Therapeutic concentrations vary  significantly. A range of 10-30 ug/mL  ?may be an effective concentration for many patients. However, some  ?are best treated at concentrations outside of this range. ?Acetaminophen concentrations >150 ug/mL at 4 hours after ingestion  ?and >50 ug/mL at 12 hours after ingestion are often associated with  ?toxic reactions. ? ?Performed at [facility], [address] ?[place], [place] [address] ?  ?Ethanol     Status: None  ? Collection Time: 10/08/21  9:00 AM  ?Result Value Ref Range  ? Alcohol, Ethyl (B) <10 <10 mg/dL  ?  Comment: (NOTE) ?Lowest detectable limit for serum alcohol is 10 mg/dL. ? ?For medical purposes only. ?Performed at [facility], [address] ?[place], [place] [address] ?  ?CBC WITH DIFFERENTIAL     Status: Abnormal  ? Collection Time: 10/08/21  9:00 AM  ?Result Value Ref Range  ? WBC 12.6 (H) 4.0 - 10.5 K/uL  ? RBC 4.65 4.22 - 5.81 MIL/uL  ? Hemoglobin 14.5 13.0 - 17.0 g/dL  ? HCT 42.1 39.0 - 52.0 %  ? MCV 90.5 80.0 - 100.0 fL  ? MCH 31.2 26.0 - 34.0 pg  ? MCHC 34.4 30.0 - 36.0 g/dL  ? RDW 12.9 11.5 - 15.5 %  ? Platelets 247 150 - 400 K/uL  ? nRBC 0.0 0.0 - 0.2 %  ? Neutrophils Relative % 68 %  ? Neutro Abs 8.6 (H) 1.7 - 7.7 K/uL  ? Lymphocytes Relative 17 %  ? Lymphs Abs 2.1 0.7 - 4.0 K/uL  ? Monocytes Relative 14 %  ? Monocytes Absolute 1.8 (H) 0.1 - 1.0 K/uL  ? Eosinophils Relative 1 %  ? Eosinophils Absolute 0.1 0.0 - 0.5 K/uL  ? Basophils Relative 0 %  ? Basophils Absolute 0.0 0.0 - 0.1 K/uL  ? Immature Granulocytes 0 %  ? Abs Immature Granulocytes 0.03 0.00 - 0.07 K/uL  ?  Comment: Performed at [facility], [address] [place], [place] [address]  ?Urine rapid drug screen (hosp performed)     Status: Abnormal  ? Collection Time: 10/08/21  5:15 PM  ?Result Value Ref Range  ? Opiates NONE DETECTED NONE DETECTED  ? Cocaine NONE DETECTED NONE DETECTED  ? Benzodiazepines POSITIVE (A) NONE DETECTED  ? Amphetamines POSITIVE (A)  NONE DETECTED  ? Tetrahydrocannabinol NONE DETECTED NONE DETECTED  ? Barbiturates NONE DETECTED NONE DETECTED  ?  Comment: (NOTE) ?DRUG SCREEN FOR MEDICAL PURPOSES ?ONLY.  IF CONFIRMATION IS NEEDED ?FOR ANY PURPOSE, NOTIFY

## 2021-10-08 NOTE — H&P (Signed)
Behavioral Health Medical Screening Exam ? ?[name] is a 41 y.o. male with psychiatric history of bipolar affective disorder, polysubstance abuse, and substance induced mood disorder.  Patient presented voluntarily to [facility] requesting information for substance abuse treatment. ?Patient was seen face-to-face and his chart was reviewed by this nurse practitioner.  On assessment patient is alert and oriented x4.  Patient is restless, fidgety and has trouble sitting still.  She is cooperative with assessment. Patient's voice is hoarse; he reports that he lost his voice 2 days ago.  Patient's mood is euthymic with congruent affect.  His thought process is coherent.  Patient did not appear to be responding to any internal/external stimuli or experiencing any delusional thought content during assessment. ? ?Patient reports that he came to [facility] to request information for [facility] substance abuse program in [place]. patient reports that he has a history of substance abuse and that his drug of choice is methamphetamine. He says that he has been using meth for over 3 years; he says he snorts approximately 0.5gram daily. He last used meth on 10/05/21. He says he is having problem falling asleep. He attributes feeling restlessness and sleep difficulties to "coming off meth." He denies all other concerns.  Patient denies SI, HI, auditory/visual hallucination, and paranoia.  ? ?Per chart review, patient was seen in [facility] emergency department [facility] health care) on 10-07-2021 for the same complaint.  He was given contact information for [facility] and [facility] for substance abuse treatment. ? ?This Clinical research associate provided contact information and encouraged patient to contact [facility] during operation hours to inquire about bed availability and admission criteria.  ? ? ?Total Time spent with patient: 15 minutes ? ?Psychiatric Specialty Exam: ? ?Presentation  ?General Appearance: Appropriate  for Environment ? ?Eye Contact:Good ? ?Speech:Clear and Coherent ? ?Speech Volume:Normal ? ?Handedness:Right ? ? ?Mood and Affect  ?Mood:Euthymic ? ?Affect:Congruent ? ? ?Thought Process  ?Thought Processes:Coherent ? ?Descriptions of Associations:Intact ? ?Orientation:Full (Time, Place and Person) ? ?Thought Content:WDL ? ?History of Schizophrenia/Schizoaffective disorder:No data recorded ?Duration of Psychotic Symptoms:No data recorded ?Hallucinations:Hallucinations: None ? ?Ideas of Reference:None ? ?Suicidal Thoughts:No data recorded ?Homicidal Thoughts:No data recorded ? ?Sensorium  ?Memory:Immediate Good; Recent Fair; Remote Fair ? ?Judgment:Fair ? ?Insight:Good ? ? ?Executive Functions  ?Concentration:Fair ? ?Attention Span:Fair ? ?Recall:Good ? ?Fund of Knowledge:Good ? ?Language:Good ? ? ?Psychomotor Activity  ?Psychomotor Activity:Psychomotor Activity: Restlessness; Increased ? ? ?Assets  ?Assets:Desire for Improvement; Communication Skills; Housing; Social Support; Physical Health ? ? ?Sleep  ?Sleep:Sleep: Poor ?Number of Hours of Sleep: 3 ? ? ? ?Physical Exam: ?Physical Exam ?Vitals reviewed.  ?Constitutional:   ?   General: He is not in acute distress. ?   Appearance: Normal appearance. He is not ill-appearing.  ?HENT:  ?   Head: Normocephalic and atraumatic.  ?   Nose: Nose normal. No congestion or rhinorrhea.  ?Eyes:  ?   General:     ?   Right eye: No discharge.     ?   Left eye: No discharge.  ?Cardiovascular:  ?   Rate and Rhythm: Normal rate.  ?   Pulses: Normal pulses.  ?Pulmonary:  ?   Effort: Pulmonary effort is normal. No respiratory distress.  ?Abdominal:  ?   General: Bowel sounds are normal. There is no distension.  ?Musculoskeletal:     ?   General: Normal range of motion.  ?   Cervical back: Normal range of motion.  ?Skin: ?  General: Skin is warm.  ?Neurological:  ?   Mental Status: He is alert and oriented to person, place, and time.  ?Psychiatric:     ?   Attention and Perception:  Attention normal. He is attentive. He does not perceive auditory or visual hallucinations.     ?   Mood and Affect: Mood is anxious.     ?   Speech: Speech normal.     ?   Behavior: Behavior is cooperative.     ?   Thought Content: Thought content is not paranoid or delusional. Thought content does not include homicidal or suicidal ideation. Thought content does not include homicidal or suicidal plan.     ?   Cognition and Memory: Cognition normal.  ? ?Review of Systems  ?Constitutional: Negative.   ?HENT: Negative.    ?Eyes: Negative.   ?Respiratory: Negative.    ?Cardiovascular: Negative.   ?Gastrointestinal: Negative.   ?Genitourinary: Negative.   ?Musculoskeletal: Negative.   ?Skin: Negative.   ?Neurological: Negative.   ?Endo/Heme/Allergies: Negative.   ?Psychiatric/Behavioral:  Positive for substance abuse. The patient has insomnia.   ?There were no vitals taken for this visit. There is no height or weight on file to calculate BMI. ? ?Musculoskeletal: ?Strength & Muscle Tone: within normal limits ?Gait & Station: normal ?Patient leans: Right ? ? ?Recommendations: ? ?Based on my evaluation the patient does not appear to have an emergency medical condition. ? ?Maricela Bo, NP ?10/08/2021, 4:02 AM ? ?

## 2021-10-08 NOTE — ED Triage Notes (Signed)
[employer] states pt father IVC pt due to being up for the past 5 days on Meth. Pt thinks everyone is against him, talking to himself and answering himself. Pt has hx of bipolar-schizophrenia . Father states pt is hallucinating. ?

## 2021-10-08 NOTE — ED Notes (Signed)
Pt has bag of belongings  in cabinet  23-25.  Pt has been seen and wand by security. ?

## 2021-10-08 NOTE — ED Notes (Signed)
Asked pt for urine.  Pt cannot urinate.  ?

## 2021-10-09 MED ORDER — ZIPRASIDONE MESYLATE 20 MG IM SOLR
10.0000 mg | Freq: Once | INTRAMUSCULAR | Status: DC | PRN
Start: 2021-10-09 — End: 2021-10-11

## 2021-10-09 MED ORDER — ZIPRASIDONE MESYLATE 20 MG IM SOLR: 10.0000 mg | Freq: Once | INTRAMUSCULAR | Status: DC

## 2021-10-09 NOTE — ED Provider Notes (Signed)
Emergency Medicine Observation Re-evaluation Note ? ?[name] is a 41 y.o. male, seen on rounds today.  Pt initially presented to the ED for complaints of Psychiatric Evaluation ?Currently, the patient is sleeping. ? ?Physical Exam  ?BP (!) 109/59 (BP Location: Left Arm)   Pulse 76   Temp 98.6 ?F (37 ?C) (Oral)   Resp 20   Ht 5\' 11"  (1.803 m)   Wt 86.2 kg   SpO2 95%   BMI 26.50 kg/m?  ?Physical Exam ?General: Sleeping ?Cardiac: Extremities well perfused ?Lungs: Breathing is unlabored ?Psych: Deferred ? ?ED Course / MDM  ?EKG:EKG Interpretation ? ?Date/Time:  Sunday Oct 08 2021 09:17:50 EDT ?Ventricular Rate:  95 ?PR Interval:  152 ?QRS Duration: 80 ?QT Interval:  384 ?QTC Calculation: 482 ?R Axis:   86 ?Text Interpretation: Normal sinus rhythm Prolonged QT Abnormal ECG No previous ECGs available Confirmed by Kommor, Madison ([id number]) on 10/08/2021 9:29:41 AM ? ?I have reviewed the labs performed to date as well as medications administered while in observation.  Recent changes in the last 24 hours include patient presented to the ED yesterday morning for family concerns of mania.  This was reportedly in the setting of a amphetamine binge.  TTS to reevaluate this morning. ? ?Plan  ?Current plan is for TTS reevaluation. ?GOVERNOR MATOS is under involuntary commitment. ?  ? ?  ?Rosalee Kaufman, MD ?10/09/21 [phone] ? ?

## 2021-10-09 NOTE — Consult Note (Signed)
Patient was sleeping this morning after he received Ativan orally this morning.  He received Geodon last night as well.  He is less restless and has not been violent since arrival.  We continue to provide safety and close Monitoring as he goes through withdrawal from Methamphetamine.  As stated yesterday father is arranging inpatient treatment long term at [facility] in [place]. ? ?

## 2021-10-10 DIAGNOSIS — F1994 Other psychoactive substance use, unspecified with psychoactive substance-induced mood disorder: Secondary | ICD-10-CM

## 2021-10-10 MED ORDER — LORAZEPAM 0.5 MG PO TABS
0.5000 mg | ORAL_TABLET | Freq: Three times a day (TID) | ORAL | Status: DC | PRN
  Administered 2021-10-10: 0.5 mg via ORAL
  Filled 2021-10-10: qty 1

## 2021-10-10 MED ORDER — LORAZEPAM 1 MG PO TABS
1.0000 mg | ORAL_TABLET | Freq: Once | ORAL | Status: DC
Start: 2021-10-10 — End: 2021-10-10

## 2021-10-10 MED ORDER — LORAZEPAM 0.5 MG PO TABS: 0.5000 mg | ORAL_TABLET | Freq: Three times a day (TID) | ORAL | 0 refills | Status: DC | PRN

## 2021-10-10 MED ORDER — LORAZEPAM 1 MG PO TABS
1.0000 mg | ORAL_TABLET | Freq: Once | ORAL | Status: AC
  Administered 2021-10-10: 1 mg via ORAL
  Filled 2021-10-10: qty 1

## 2021-10-10 NOTE — Discharge Instructions (Addendum)
?  For your mental health needs you are advised to follow up with one of the providers listed below.  Contact them at your earliest opportunity to schedule an intake appointment: ? ?     [facility] ?     [address] ?     [place], [place] [address] ?     [phone]  ? ?     [facility] ?     [address], [address] ?     [place], [place] [address] ?     [phone]  ? ?     [facility] ?     [address] ?     [place], [place] [address] ?     [phone]  ? ?To help you maintain a sober lifestyle, a substance use disorder treatment program may be beneficial to you.  Contact one of the following providers at your earliest opportunity to ask about enrolling in their program:  ? ?MEDICAL DETOX/RESIDENTIAL TREATMENT:  ? ?     [facility]  ?     [id number]  ?     [place], [place] [address]  ?     [phone]  ? ?     [facility] ?     [address] ?     [place], [place] [address] ?     [phone]  ? ?     [facility] ?     [address] ?     [place], [place] [address] ?     [phone]  ? ?     [facility] ?     [address]     [place], [place] [address]  ?     [phone]  ? ?RESIDENTIAL REHAB PROGRAMS - LONG TERM CHARITABLE: ? ?     [facility]  ?     [address]  ?     [place], [place] [address]  ?     [phone]  ? ?     [facility]  ?     [address]  ?     [place], [place] [address]  ?     [phone]  ?  ?     [facility] [id number]  ?     [place], [place] [address]  ?     [phone]  ? ?     [facility]  ?     [address]     [place], [place] [address]  ?     [phone]  ? ?     [facility] ?     [place], [place]  ?     [place],  [address]  ?     [phone]  ?

## 2021-10-10 NOTE — ED Provider Notes (Addendum)
Emergency Medicine Observation Re-evaluation Note ? ?[name] is a 41 y.o. male, seen on rounds today.  Pt initially presented to the ED for complaints of Psychiatric Evaluation ?Currently, the patient is resting. ? ?Physical Exam  ?BP (!) 106/51 (BP Location: Right Arm)   Pulse 66   Temp 98.3 ?F (36.8 ?C) (Oral)   Resp 18   Ht 5\' 11"  (1.803 m)   Wt 86.2 kg   SpO2 96%   BMI 26.50 kg/m?  ?Physical Exam ?General: NAD ?Cardiac: well perfused ?Lungs: even and unlabored ?Psych: no agitation ? ?ED Course / MDM  ?EKG:EKG Interpretation ? ?Date/Time:  Sunday Oct 08 2021 09:17:50 EDT ?Ventricular Rate:  95 ?PR Interval:  152 ?QRS Duration: 80 ?QT Interval:  384 ?QTC Calculation: 482 ?R Axis:   86 ?Text Interpretation: Normal sinus rhythm Prolonged QT Abnormal ECG No previous ECGs available Confirmed by [name] ([id number]) on 10/08/2021 9:29:41 AM ? ?I have reviewed the labs performed to date as well as medications administered while in observation.  Recent changes in the last 24 hours include patient evaluated by psychiatry, plan as follows: ?Psych note 5/8: ?"Patient was sleeping this morning after he received Ativan orally this morning.  He received Geodon last night as well.  He is less restless and has not been violent since arrival.  We continue to provide safety and close Monitoring as he goes through withdrawal from Methamphetamine.  As stated yesterday father is arranging inpatient treatment long term at [facility] in [place]." ? ?Plan  ?Current plan is for transfer/admission to [facility] tomorrow 5/10. SW coordinating. ?[name] is no longer under involuntary commitment. ?  ? ?  ? ? ?  ?Jaime Kaufman, MD ?10/10/21 1032 ? ?

## 2021-10-10 NOTE — ED Notes (Signed)
Call received from pt friend Bonney Leitz [phone] requesting rtn call re: clothes/personal items to bring for pick up tomorrow morning. ENMiles ?

## 2021-10-10 NOTE — ED Notes (Signed)
Pt resting comfortably at this time. Respirations even and unlabored.  

## 2021-10-10 NOTE — ED Notes (Signed)
Patient at nurses desk talking on phone with his sister ? ?

## 2021-10-10 NOTE — Consult Note (Signed)
Patient for discharge by 8 am.  family will pick him up and take him straight to [facility].  He is still IVC. ?

## 2021-10-10 NOTE — Consult Note (Signed)
[facility] Psych ED Progress Note ? ?10/10/2021 11:02 AM ?[name]  ?MRN:  [id number] ? ? ?Method of visit?: Face to Face  ? ?Subjective:  [name] is a 41 y.o. male patient admitted with Hx significant for  Bipolar 1 disorder, substance-induced mood disorder, methamphetamine and crack cocaine use brought in by his father for Manic symptoms/Amphetamine withdrawal symptoms.  Patient was brought in under IVC and was on Amphetamine binge for three days before coming in to the hospital. ?Patient continues to detox and improving.  V/S are stable, he is no longer toss and rolling in the bed.  Gait is steady and insight and judgement is wnl.  We were going to send him home today but family stated that [facility] [place] will take him only from a hospital setting.  Patient remains under IVC and will be discharge tomorrow to go to [facility] to continue treatment.  Patient was not happy that he is not leaving today but has since calmed down.  He denied SI/HI/AVH. ?Principal Problem: Substance induced mood disorder (HCC) ?Diagnosis:  Principal Problem: ?  Substance induced mood disorder (HCC) ? ?Total Time spent with patient: 20 minutes ? ?Past Psychiatric History: See initial Psych evaluation note ? ?Past Medical History:  ?Past Medical History:  ?Diagnosis Date  ? Bipolar 1 disorder (HCC)   ? Depression   ? Heart murmur   ?  ?Past Surgical History:  ?Procedure Laterality Date  ? BACK SURGERY  2011  ? had melanoma mole removed  ? ?Family History:  ?Family History  ?Problem Relation Age of Onset  ? Heart Problems Father   ? ?Family Psychiatric  History: see initial psych evaluation note ?Social History:  ?Social History  ? ?Substance and Sexual Activity  ?Alcohol Use Yes  ? Comment: occasionally sober 7 months  ?   ?Social History  ? ?Substance and Sexual Activity  ?Drug Use Yes  ? Types: Cocaine  ? Comment: opiates- currently in treatment at [facility] (11/18/14) clean 7 months  ?  ?Social History   ? ?Socioeconomic History  ? Marital status: Single  ?  Spouse name: Not on file  ? Number of children: Not on file  ? Years of education: Not on file  ? Highest education level: Not on file  ?Occupational History  ? Not on file  ?Tobacco Use  ? Smoking status: Every Day  ?  Packs/day: 1.00  ?  Years: 5.00  ?  Pack years: 5.00  ?  Types: Cigarettes  ?  Last attempt to quit: 06/04/2013  ?  Years since quitting: 8.3  ? Smokeless tobacco: Never  ?Substance and Sexual Activity  ? Alcohol use: Yes  ?  Comment: occasionally sober 7 months  ? Drug use: Yes  ?  Types: Cocaine  ?  Comment: opiates- currently in treatment at [facility] (11/18/14) clean 7 months  ? Sexual activity: Not Currently  ?Other Topics Concern  ? Not on file  ?Social History Narrative  ? Not on file  ? ?Social Determinants of Health  ? ?Financial Resource Strain: Not on file  ?Food Insecurity: Not on file  ?Transportation Needs: Not on file  ?Physical Activity: Not on file  ?Stress: Not on file  ?Social Connections: Not on file  ? ? ?Sleep: Good ? ?Appetite:  Fair ? ?Current Medications: ?Current Facility-Administered Medications  ?Medication Dose Route Frequency Provider Last Rate Last Admin  ? LORazepam (ATIVAN) tablet 0.5 mg  0.5 mg Oral Q8H PRN  Earney Navy, NP      ? ziprasidone (GEODON) injection 10 mg  10 mg Intramuscular Once PRN Charlynne Pander, MD      ? ?Current Outpatient Medications  ?Medication Sig Dispense Refill  ? LORazepam (ATIVAN) 0.5 MG tablet Take 1 tablet (0.5 mg total) by mouth every 8 (eight) hours as needed for up to 6 doses for anxiety. 6 tablet 0  ? ? ?Lab Results:  ?Results for orders placed or performed during the hospital encounter of 10/08/21 (from the past 48 hour(s))  ?Urine rapid drug screen (hosp performed)     Status: Abnormal  ? Collection Time: 10/08/21  5:15 PM  ?Result Value Ref Range  ? Opiates NONE DETECTED NONE DETECTED  ? Cocaine NONE DETECTED NONE DETECTED  ? Benzodiazepines POSITIVE (A) NONE DETECTED  ?  Amphetamines POSITIVE (A) NONE DETECTED  ? Tetrahydrocannabinol NONE DETECTED NONE DETECTED  ? Barbiturates NONE DETECTED NONE DETECTED  ?  Comment: (NOTE) ?DRUG SCREEN FOR MEDICAL PURPOSES ?ONLY.  IF CONFIRMATION IS NEEDED ?FOR ANY PURPOSE, NOTIFY LAB ?WITHIN 5 DAYS. ? ?LOWEST DETECTABLE LIMITS ?FOR URINE DRUG SCREEN ?Drug Class                     Cutoff (ng/mL) ?Amphetamine and metabolites    1000 ?Barbiturate and metabolites    200 ?Benzodiazepine                 200 ?Tricyclics and metabolites     300 ?Opiates and metabolites        300 ?Cocaine and metabolites        300 ?THC                            50 ?Performed at [facility], [address] ?[place], [place] [address] ?  ? ? ?Blood Alcohol level:  ?Lab Results  ?Component Value Date  ? ETH <10 10/08/2021  ? ETH <5 08/29/2015  ? ? ?Physical Findings: ?AIMS:  , ,  ,  ,    ?CIWA:    ?COWS:    ? ?Musculoskeletal: ?Strength & Muscle Tone: within normal limits ?Gait & Station: normal ?Patient leans: Front ? ?Psychiatric Specialty Exam: ? ?Presentation  ?General Appearance: Appropriate for Environment; Fairly Groomed ? ?Eye Contact:Good ? ?Speech:Normal Rate ? ?Speech Volume:Normal ? ?Handedness:Right ? ? ?Mood and Affect  ?Mood:Angry; Irritable ? ?Affect:Congruent ? ? ?Thought Process  ?Thought Processes:Coherent; Goal Directed ? ?Descriptions of Associations:Intact ? ?Orientation:Full (Time, Place and Person) ? ?Thought Content:Logical ? ?History of Schizophrenia/Schizoaffective disorder:No data recorded ?Duration of Psychotic Symptoms:No data recorded ?Hallucinations:Hallucinations: None ? ?Ideas of Reference:None ? ?Suicidal Thoughts:Suicidal Thoughts: No ? ?Homicidal Thoughts:Homicidal Thoughts: No ? ? ?Sensorium  ?Memory:Immediate Good; Recent Good; Remote Good ? ?Judgment:Fair ? ?Insight:Fair ? ? ?Executive Functions  ?Concentration:Fair ? ?Attention Span:Fair ? ?Recall:Fair ? ?Fund of Knowledge:Fair ? ?Language:Fair ? ? ?Psychomotor  Activity  ?Psychomotor Activity:Psychomotor Activity: Normal ? ? ?Assets  ?Assets:Communication Skills; Desire for Improvement; Housing; Intimacy ? ? ?Sleep  ?Sleep:Sleep: Fair ? ? ? ?Physical Exam: ?Physical Exam ?Vitals and nursing note reviewed.  ?Constitutional:   ?   Appearance: Normal appearance.  ?HENT:  ?   Head: Normocephalic and atraumatic.  ?   Nose: Nose normal.  ?Cardiovascular:  ?   Rate and Rhythm: Normal rate and regular rhythm.  ?Pulmonary:  ?   Effort: Pulmonary effort is normal.  ?Musculoskeletal:     ?  General: Normal range of motion.  ?   Cervical back: Normal range of motion.  ?Skin: ?   General: Skin is warm and dry.  ?Neurological:  ?   General: No focal deficit present.  ?   Mental Status: He is alert and oriented to person, place, and time.  ? ?Review of Systems  ?Constitutional: Negative.   ?HENT: Negative.    ?Eyes: Negative.   ?Respiratory: Negative.    ?Cardiovascular: Negative.   ?Gastrointestinal: Negative.   ?Genitourinary: Negative.   ?Musculoskeletal: Negative.   ?Skin: Negative.   ?Neurological: Negative.   ?Endo/Heme/Allergies: Negative.   ?Psychiatric/Behavioral:  Positive for substance abuse. The patient is nervous/anxious.   ?Blood pressure (!) 128/52, pulse 78, temperature 97.8 ?F (36.6 ?C), temperature source Oral, resp. rate 18, height 5\' 11"  (1.803 m), weight 86.2 kg, SpO2 96 %. Body mass index is 26.5 kg/m?. ? ?Treatment Plan Summary: ?Daily contact with patient to assess and evaluate symptoms and progress in treatment, Medication management, and Plan Will Discharge in am for Long term care substance abuse treatment.   Family will pick up in am. ? ?Earney NavyJosephine C Naija Troost, NP-PMHNP-BC ?10/10/2021, 11:02 AM ? ?

## 2021-10-10 NOTE — BH Assessment (Addendum)
[facility] Assessment Progress Note ?  ?Per Dahlia Byes, NP, this pt does not require psychiatric hospitalization at this time.  Pt presents under IVC initiated by pt's father and upheld by EDP Glendora Score, MD, which has been rescinded by EDP Ernie Avena, MD.  Pt is psychiatrically cleared.  Discharge instructions include referrals for several area psychiatry providers and for a number of area substance use disorder treatment providers.  Julieanne Cotton has been notified. ? ?Doylene Canning, MA ?Triage Specialist ?[phone] ? ?Addendum: ? ?After further review Julieanne Cotton has determined that pt is not psychiatrically cleared and is not ready for discharge.  She does not recommend rescinding IVC.  Pt to remain in [facility] for another night. ? ?Doylene Canning, MA ?Behavioral Health Coordinator ?[phone]  ?

## 2021-10-11 ENCOUNTER — Other Ambulatory Visit (HOSPITAL_COMMUNITY): Payer: Self-pay

## 2021-10-11 MED ORDER — LORAZEPAM 0.5 MG PO TABS
0.5000 mg | ORAL_TABLET | Freq: Three times a day (TID) | ORAL | 0 refills | Status: DC | PRN
  Filled 2021-10-11: qty 6, 2d supply, fill #0

## 2021-10-11 NOTE — ED Notes (Signed)
Patient Alert and oriented to baseline. Stable and ambulatory to baseline. Patient verbalized understanding of the discharge instructions.  Patient belongings were taken by the patient.   

## 2021-11-11 ENCOUNTER — Other Ambulatory Visit: Payer: Self-pay

## 2021-11-11 ENCOUNTER — Ambulatory Visit (HOSPITAL_COMMUNITY)
Admission: RE | Admit: 2021-11-11 | Discharge: 2021-11-11 | Disposition: A | Discharge status: Home / Self Care | Payer: Medicare HMO | Source: Home / Self Care

## 2021-11-11 ENCOUNTER — Encounter (HOSPITAL_COMMUNITY): Payer: Self-pay

## 2021-11-11 ENCOUNTER — Emergency Department (HOSPITAL_COMMUNITY)
Admission: EM | Admit: 2021-11-11 | Discharge: 2021-11-13 | Disposition: A | Discharge status: Other Acute Inpatient Hospital | Payer: Medicare HMO | Attending: Emergency Medicine | Admitting: Emergency Medicine

## 2021-11-11 DIAGNOSIS — R45851 Suicidal ideations: Secondary | ICD-10-CM | POA: Insufficient documentation

## 2021-11-11 DIAGNOSIS — F319 Bipolar disorder, unspecified: Secondary | ICD-10-CM | POA: Insufficient documentation

## 2021-11-11 DIAGNOSIS — Z20822 Contact with and (suspected) exposure to covid-19: Secondary | ICD-10-CM | POA: Diagnosis not present

## 2021-11-11 DIAGNOSIS — F1911 Other psychoactive substance abuse, in remission: Secondary | ICD-10-CM | POA: Insufficient documentation

## 2021-11-11 DIAGNOSIS — Z79899 Other long term (current) drug therapy: Secondary | ICD-10-CM | POA: Diagnosis not present

## 2021-11-11 DIAGNOSIS — Z046 Encounter for general psychiatric examination, requested by authority: Secondary | ICD-10-CM | POA: Insufficient documentation

## 2021-11-11 LAB — RAPID URINE DRUG SCREEN, HOSP PERFORMED
Amphetamines: POSITIVE — AB
Barbiturates: NOT DETECTED
Benzodiazepines: NOT DETECTED
Cocaine: NOT DETECTED
Opiates: NOT DETECTED
Tetrahydrocannabinol: POSITIVE — AB

## 2021-11-11 LAB — CBC
HCT: 43.1 % (ref 39.0–52.0)
Hemoglobin: 13.8 g/dL (ref 13.0–17.0)
MCH: 30.4 pg (ref 26.0–34.0)
MCHC: 32 g/dL (ref 30.0–36.0)
MCV: 94.9 fL (ref 80.0–100.0)
Platelets: 247 10*3/uL (ref 150–400)
RBC: 4.54 MIL/uL (ref 4.22–5.81)
RDW: 13.3 % (ref 11.5–15.5)
WBC: 5.9 10*3/uL (ref 4.0–10.5)
nRBC: 0 % (ref 0.0–0.2)

## 2021-11-11 LAB — COMPREHENSIVE METABOLIC PANEL
ALT: 35 U/L (ref 0–44)
AST: 19 U/L (ref 15–41)
Albumin: 3.9 g/dL (ref 3.5–5.0)
Alkaline Phosphatase: 53 U/L (ref 38–126)
Anion gap: 6 (ref 5–15)
BUN: 12 mg/dL (ref 6–20)
CO2: 27 mmol/L (ref 22–32)
Calcium: 8.9 mg/dL (ref 8.9–10.3)
Chloride: 109 mmol/L (ref 98–111)
Creatinine, Ser: 1.02 mg/dL (ref 0.61–1.24)
GFR, Estimated: >60 mL/min (ref >60)
Glucose, Bld: 92 mg/dL (ref 70–99)
Potassium: 3.6 mmol/L (ref 3.5–5.1)
Sodium: 142 mmol/L (ref 135–145)
Total Bilirubin: 1.1 mg/dL (ref 0.3–1.2)
Total Protein: 6.9 g/dL (ref 6.5–8.1)

## 2021-11-11 LAB — ACETAMINOPHEN LEVEL: Acetaminophen (Tylenol), Serum: <10 ug/mL — ABNORMAL LOW (ref 10–30)

## 2021-11-11 LAB — ETHANOL: Alcohol, Ethyl (B): <10 mg/dL (ref <10)

## 2021-11-11 LAB — SALICYLATE LEVEL: Salicylate Lvl: <7 mg/dL — ABNORMAL LOW (ref 7.0–30.0)

## 2021-11-11 NOTE — H&P (Cosign Needed Addendum)
Behavioral Health Medical Screening Exam  [name] is an 41 y.o. male who presented to [facility] voluntarily seeking inpatient hospitalization. Patient presents disgruntled with some underlying irritability. Patient reports active methamphetamine use; last use x3 days ago. States he has been experiencing increased thoughts of depression, anxiety, and feelings of worthlessness. He expressed that he was "really wanting to go to the [facility] 9 month program but they aren't answering the phone so I figured I could come here for a few days then go there". Provider attempted to discuss inpatient criteria, detox and local resources; patient refused and demanded to leave, provider walked with patient to lobby.  Total Time spent with patient: 15 minutes  Psychiatric Specialty Exam: Physical Exam Vitals and nursing note reviewed.  Constitutional:      Appearance: He is normal weight.  HENT:     Head: Normocephalic.     Nose: Nose normal.     Mouth/Throat:     Mouth: Mucous membranes are moist.     Pharynx: Oropharynx is clear.  Eyes:     Pupils: Pupils are equal, round, and reactive to light.  Cardiovascular:     Rate and Rhythm: Normal rate.     Pulses: Normal pulses.  Pulmonary:     Effort: Pulmonary effort is normal.  Abdominal:     General: Abdomen is flat.  Musculoskeletal:        General: Normal range of motion.     Cervical back: Normal range of motion.  Skin:    General: Skin is warm and dry.  Neurological:     Mental Status: He is alert and oriented to person, place, and time. Mental status is at baseline.  Psychiatric:        Attention and Perception: Attention and perception normal.        Mood and Affect: Affect is blunt and angry.        Speech: Speech normal.        Behavior: Behavior is uncooperative and withdrawn.        Thought Content: Thought content is not paranoid or delusional. Thought content does not include homicidal or suicidal ideation. Thought content  does not include homicidal or suicidal plan.        Cognition and Memory: Cognition and memory normal.   Review of Systems  Psychiatric/Behavioral:  Positive for dysphoric mood.   All other systems reviewed and are negative.  There were no vitals taken for this visit.There is no height or weight on file to calculate BMI. General Appearance: Casual and Fairly Groomed Eye Contact:  Fair Speech:  Clear and Coherent Volume:  Normal Mood:  Dysphoric Affect:  Restricted Thought Process:  Goal Directed Orientation:  Full (Time, Place, and Person) Thought Content:  Logical Suicidal Thoughts:   passive; not clear Homicidal Thoughts:  No Memory:  Immediate;   Fair Recent;   Fair Remote;   Fair Judgement:  Fair Insight:  Fair Psychomotor Activity:  Normal Concentration: Concentration: Good and Attention Span: Good Recall:  Good Fund of Knowledge:Fair Language: Good Akathisia:  NA Handed:  Right AIMS (if indicated):    Assets:  Physical Health Resilience Sleep:     Musculoskeletal: Strength & Muscle Tone: within normal limits Gait & Station: normal Patient leans: N/A  There were no vitals taken for this visit.  Recommendations: Based on my evaluation the patient does not appear to have an emergency medical condition. Patient requested to leave prior to completing assessment.   Kylin Dubs A Leevy-Johnson,  NP 11/11/2021, 1:20 PM

## 2021-11-11 NOTE — BH Assessment (Signed)
Patient is a 41 year old male that presents this date voluntary to [facility] as a walk in requesting assistance with ongoing SA issues and passive S/I. Patient denies any prior attempts or gestures at self harm and denies any intent or plan this date. Patient denies any H/I or AVH. Patient reports a history of methamphetamine use and Bipolar affective disorder. Patient states he receives OP services from [facility] who assists with medication management. Patient states he is on Depakote and "3 other medications" he cannot recall. Patient states he last met with that provider a month ago and reports current medication compliance. Patient denies having a therapist at this time. Patient reported daily amphetamine use (up to 1 gram a day for the last 6 months) until Wednesday 6/7 when he states he "quit using to try and get it together." Patient denies using any other substances or amphetamines since then. Patient reports that he is trying to get into [facility] although has not investigated their admission process stating he "knows they will want him to get detoxed first." Patient renders limited history in reference to additional psycho-social stressors or mental health symptoms stating "I am just depressed." This Probation officer discussed other area programs that could assist with SA issues to include [facility] and [facility] although patient states "he isn't interested." Patient was last seen on 5/7 when he presented to [facility] with similar needs and symptoms and was provided with resources not meeting inpatient criteria at that time. Patient when asked this date if he followed up with those resources patient stated "none of that works." Patient states he is currently homeless and "needs to be somewhere for a few days until he finds a substance abuse place to help." Patient is currently contracting for safety and throughout the assessment rendered conflicting history. Patient was seen and evaluated by [facility] NP who  reported patient was demanding to leave as soon as she attempted to gather a history after informing him of what services [facility] provides. See note of that date. Patient left before any resources could be  provided.

## 2021-11-11 NOTE — BH Assessment (Addendum)
Clinician spoke to [place], at [facility] and was transferred to triage to speak to pt's nurse, after a few rings the call went to voicemail. Clinician also messaged, Kady L. Leatha Gilding, EMT: "Hey. It's Trey with TTS. Is the pt able to engage in the assessment, if so the pt will need to be placed in a private room. Also is the pt under IVC?"   Clinician awaiting response.    Redmond Pulling, MS, [facility], [facility] Triage Specialist [phone]

## 2021-11-11 NOTE — ED Provider Notes (Signed)
[place] DEPT Provider Note   CSN: [id number] Arrival date & time: 11/11/21  1940     History  Chief Complaint  Patient presents with   Suicidal    [name] is a 41 y.o. male with a PMHx of bipolar disorder who presents to the ED complaining of SI onset today. Pt notes that he would run out into traffic. He would like to be evaluated due to his SI. No meds tried. He would like to go straight to [facility] if possible after being seen here. Denies pain at this time. Denies auditory/visual hallucinations, HI.    The history is provided by the patient. No language interpreter was used.       Home Medications Prior to Admission medications   Medication Sig Start Date End Date Taking? Authorizing Provider  benztropine (COGENTIN) 0.5 MG tablet Take 0.5 mg by mouth at bedtime. 10/14/21  Yes [provider]  busPIRone (BUSPAR) 7.5 MG tablet Take 7.5 mg by mouth 2 (two) times daily. 05/17/21  Yes [provider]  divalproex (DEPAKOTE) 500 MG DR tablet Take 500 mg by mouth 2 (two) times daily. 10/14/21  Yes [provider]  FLUoxetine (PROZAC) 20 MG capsule Take 20 mg by mouth every morning. 10/14/21  Yes [provider]  LORazepam (ATIVAN) 0.5 MG tablet Take 1 tablet (0.5 mg total) by mouth every 8 (eight) hours as needed for up to 6 doses for anxiety. 10/10/21  Yes Charmaine Downs C, NP  OLANZapine (ZYPREXA) 10 MG tablet Take 10 mg by mouth at bedtime. 10/14/21  Yes [provider]  sildenafil (VIAGRA) 50 MG tablet Take 50 mg by mouth daily as needed for erectile dysfunction. 09/07/21  Yes [provider]      Allergies    Patient has no known allergies.    Review of Systems   Review of Systems  Psychiatric/Behavioral:  Positive for suicidal ideas. Negative for hallucinations.   All other systems reviewed and are negative.   Physical Exam Updated Vital Signs BP (!) 125/93 (BP Location: Left  Arm)   Pulse 74   Temp 98.1 F (36.7 C) (Oral)   Resp 16   Ht 5\' 11"  (1.803 m)   Wt 90.7 kg   SpO2 97%   BMI 27.89 kg/m  Physical Exam Vitals and nursing note reviewed.  Constitutional:      General: He is not in acute distress.    Appearance: He is not diaphoretic.  HENT:     Head: Normocephalic and atraumatic.     Mouth/Throat:     Pharynx: No oropharyngeal exudate.  Eyes:     General: No scleral icterus.    Conjunctiva/sclera: Conjunctivae normal.  Cardiovascular:     Rate and Rhythm: Normal rate and regular rhythm.     Pulses: Normal pulses.     Heart sounds: Normal heart sounds.  Pulmonary:     Effort: Pulmonary effort is normal. No respiratory distress.     Breath sounds: Normal breath sounds. No wheezing.  Abdominal:     General: Bowel sounds are normal.     Palpations: Abdomen is soft. There is no mass.     Tenderness: There is no abdominal tenderness. There is no guarding or rebound.  Musculoskeletal:        General: Normal range of motion.     Cervical back: Normal range of motion and neck supple.  Skin:    General: Skin is warm and dry.  Neurological:  Mental Status: He is alert.  Psychiatric:        Behavior: Behavior normal.     ED Results / Procedures / Treatments   Labs (all labs ordered are listed, but only abnormal results are displayed) Labs Reviewed  SALICYLATE LEVEL - Abnormal; Notable for the following components:      Result Value   Salicylate Lvl [id number] (*)    All other components within normal limits  ACETAMINOPHEN LEVEL - Abnormal; Notable for the following components:   Acetaminophen (Tylenol), Serum <10 (*)    All other components within normal limits  RAPID URINE DRUG SCREEN, HOSP PERFORMED - Abnormal; Notable for the following components:   Amphetamines POSITIVE (*)    Tetrahydrocannabinol POSITIVE (*)    All other components within normal limits  COMPREHENSIVE METABOLIC PANEL  ETHANOL  CBC    EKG None  Radiology No  results found.  Procedures Procedures    Medications Ordered in ED Medications  benztropine (COGENTIN) tablet 0.5 mg (has no administration in time range)  busPIRone (BUSPAR) tablet 7.5 mg (has no administration in time range)  divalproex (DEPAKOTE) DR tablet 500 mg (has no administration in time range)  FLUoxetine (PROZAC) capsule 20 mg (has no administration in time range)  LORazepam (ATIVAN) tablet 0.5 mg (has no administration in time range)  OLANZapine (ZYPREXA) tablet 10 mg (has no administration in time range)    ED Course/ Medical Decision Making/ A&P                           Medical Decision Making Amount and/or Complexity of Data Reviewed Labs: ordered.  Risk Prescription drug management.   Pt presents with concerns for SI onset today.  Has a plan to run into traffic.  Denies auditory/visual hallucinations.  Denies HI.  Vital signs stable, patient afebrile.  On exam patient without any acute cardiovascular, respiratory, abdominal exam findings.    Labs:  I ordered, and personally interpreted labs.  The pertinent results include:   Cbc, cmp, etoh, acetaminophen, salicyclate unremarkable UDS notable for amphetamines and THC.   Disposition: Pt here voluntary due to SI concerns. Labs without acute findings, patient medically cleared at this time.  Home meds reordered.  TTS consult placed.   This chart was dictated using voice recognition software, Dragon. Despite the best efforts of this provider to proofread and correct errors, errors may still occur which can change documentation meaning.   Final Clinical Impression(s) / ED Diagnoses Final diagnoses:  Suicidal ideation    Rx / DC Orders ED Discharge Orders     None         Chriss Mannan A, PA-C 11/12/21 0008    Valarie Merino, MD 11/12/21 2309

## 2021-11-11 NOTE — ED Triage Notes (Signed)
Ambulatory to ED with c/o SI with thoughts of running into traffic since Wednesday. Seen for same in the past, discharged with resources - states he never followed up. States he's desperate for help this time, really wants to get his life together. States he drinks a few beers a day, but none since Wednesday.

## 2021-11-12 LAB — SARS CORONAVIRUS 2 BY RT PCR: SARS Coronavirus 2 by RT PCR: NEGATIVE

## 2021-11-12 MED ORDER — DIVALPROEX SODIUM 500 MG PO DR TAB
500.0000 mg | DELAYED_RELEASE_TABLET | Freq: Two times a day (BID) | ORAL | Status: DC
  Administered 2021-11-12 (×3): 500 mg via ORAL
  Filled 2021-11-12 (×3): qty 1

## 2021-11-12 MED ORDER — BUSPIRONE HCL 5 MG PO TABS
7.5000 mg | ORAL_TABLET | Freq: Two times a day (BID) | ORAL | Status: DC
  Administered 2021-11-12 (×2): 7.5 mg via ORAL
  Filled 2021-11-12 (×4): qty 1.5

## 2021-11-12 MED ORDER — FLUOXETINE HCL 20 MG PO CAPS
20.0000 mg | ORAL_CAPSULE | Freq: Every morning | ORAL | Status: DC
  Administered 2021-11-12: 20 mg via ORAL
  Filled 2021-11-12: qty 1

## 2021-11-12 MED ORDER — BENZTROPINE MESYLATE 0.5 MG PO TABS
0.5000 mg | ORAL_TABLET | Freq: Every day | ORAL | Status: DC
  Administered 2021-11-12 (×2): 0.5 mg via ORAL
  Filled 2021-11-12 (×2): qty 1

## 2021-11-12 MED ORDER — OLANZAPINE 10 MG PO TABS
10.0000 mg | ORAL_TABLET | Freq: Every day | ORAL | Status: DC
  Administered 2021-11-12 (×2): 10 mg via ORAL
  Filled 2021-11-12 (×2): qty 1

## 2021-11-12 MED ORDER — LORAZEPAM 0.5 MG PO TABS
0.5000 mg | ORAL_TABLET | Freq: Three times a day (TID) | ORAL | Status: DC | PRN
Start: 2021-11-12 — End: 2021-11-13

## 2021-11-12 NOTE — Consult Note (Signed)
Patient has been sleeping all day.  Seen earlier this morning by TTS and plan is to transfer him to [facility] once Covid result is out negative.  He has not eaten any food or drinking much.  Once he is ready by means of negative Covid result he will be transferred to [facility].

## 2021-11-12 NOTE — BH Assessment (Signed)
Comprehensive Clinical Assessment (CCA) Note  11/12/2021 [name] [id number]  Disposition: Jaime Guadeloupeoy Williams, NP recommends pt to be observed and reassessed by psychiatry. Disposition discussed with  Jaime L. Leatha GildingLivingston, EMT-P.   Flowsheet Row ED from 11/11/2021 in [facility] ED from 10/08/2021 in [facility]  C-SSRS RISK CATEGORY High Risk No Risk      The patient demonstrates the following risk factors for suicide: Chronic risk factors for suicide include: psychiatric disorder of Major Depressive Disorder and substance use disorder. Acute risk factors for suicide include: unemployment, social withdrawal/isolation, and Pt reports, he used substances on Wednesday which triggered suicidal thoughts, has a plan to run in front of a car . Protective factors for this patient include:  None . Considering these factors, the overall suicide risk at this point appears to be high. Patient is appropriate for outpatient follow up.  [name] is a 41 year old male who [facility] and [facility]. Clinician asked the pt, "what brought you to the hospital?" Pt reports, earlier he [facility] but left during the assessment because he felt the provider had an attitude. Pt reports, he came back because he wants help. Pt reports, he used substances on Wednesday which triggered suicidal thoughts, has a plan to run in front of a car. Pt reports, his mental health, substance use, homelessness and unemployment are stressors. Per pt, he's been homeless on and off for four years. Pt denies, HI, AVH, self-injurious behaviors and access to weapons.   Pt reports, using three grams of Methamphetamines from Monday to Wednesday. Pt reports, using daily during a two week period a month. Pt's UDS is Alcohol and Amphetamines. Pt reports, drinking a couple of beers on Wednesday, once per week.. Pt's BAL was <10.   Pt presents alert  with normal speech. Pt's mood, affect was depressed. Pt's insight was fair. Pt's judgement was poor. Pt reports, he wants to follow the recommendation of the hospital and wants to be linked to the [facility] in [place] to address his substance use. Clinician discussed the process and the three possible dispositions (discharged with OPT resources, observe/reassess by psychiatry or inpatient treatment) in detail. Pt expressed an understanding. Pt reports, he does not know if he can contract for safety if discharged.   Diagnosis: Major Depressive Disorder.                   Stimulant use Disorder, Amphetamines type Substance, moderate  Chief Complaint:  Chief Complaint  Patient presents with   Suicidal   Visit Diagnosis:     CCA Screening, Triage and Referral (STR)  Patient Reported Information How did you hear about us? Other (Comment) (EMS.)  What Is the Reason for Your Visit/Call Today? Per RN note: "Ambulatory to ED with c/o SI with thoughts of running into traffic since Wednesday. Seen for same in the past, discharged with resources - states he never followed up. States he's desperate for help this time, really wants to get his life together. States he drinks a few beers a day, but none since Wednesday."  How Long Has This Been Causing You Problems? <Week  What Do You Feel Would Help You the Most Today? Alcohol or Drug Use Treatment; Treatment for Depression or other mood problem   Have You Recently Had Any Thoughts About Hurting Yourself? Yes  Are You Planning to Commit Suicide/Harm Yourself At This time? Yes   Have you Recently  Had Thoughts About Hurting Someone [name]? No  Are You Planning to Harm Someone at This Time? No  Explanation: No data recorded  Have You Used Any Alcohol or Drugs in the Past 24 Hours? Yes  How Long Ago Did You Use Drugs or Alcohol? No data recorded What Did You Use and How Much? No data recorded  Do You Currently Have a  Therapist/Psychiatrist? No data recorded Name of Therapist/Psychiatrist: No data recorded  Have You Been Recently Discharged From Any Office Practice or Programs? No data recorded Explanation of Discharge From Practice/Program: No data recorded    CCA Screening Triage Referral Assessment Type of Contact: Tele-Assessment  Telemedicine Service Delivery: Telemedicine service delivery: This service was provided via telemedicine using a 2-way, interactive audio and video technology  Is this Initial or Reassessment? Initial Assessment  Date Telepsych consult ordered in [facility]:  11/11/21  Time Telepsych consult ordered in [facility]:  2137  Location of Assessment: [facility] ED  Provider Location: [facility] Assessment Services   Collateral Involvement: Pt reports, he has supports but declined for clinician to contact anyone to obtain additional information.   Does Patient Have a Automotive engineer Guardian? No data recorded Name and Contact of Legal Guardian: No data recorded If Minor and Not Living with Parent(s), Who has Custody? No data recorded Is CPS involved or ever been involved? No data recorded Is APS involved or ever been involved? No data recorded  Patient Determined To Be At Risk for Harm To Self or Others Based on Review of Patient Reported Information or Presenting Complaint? Yes, for Self-Harm  Method: No data recorded Availability of Means: No data recorded Intent: No data recorded Notification Required: No data recorded Additional Information for Danger to Others Potential: No data recorded Additional Comments for Danger to Others Potential: No data recorded Are There Guns or Other Weapons in Your Home? No data recorded Types of Guns/Weapons: No data recorded Are These Weapons Safely Secured?                            No data recorded Who Could Verify You Are Able To Have These Secured: No data recorded Do You Have any Outstanding Charges, Pending Court Dates, Parole/Probation?  No data recorded Contacted To Inform of Risk of Harm To Self or Others: No data recorded   Does Patient Present under Involuntary Commitment? No  IVC Papers Initial File Date: No data recorded  [place] of Residence: [place]   Patient Currently Receiving the Following Services: Medication Management   Determination of Need: Urgent (48 hours)   Options For Referral: Medication Management; BH Urgent Care; Outpatient Therapy; Inpatient Hospitalization     CCA Biopsychosocial Patient Reported Schizophrenia/Schizoaffective Diagnosis in Past: No data recorded  Strengths: No data recorded  Mental Health Symptoms Depression:   Hopelessness; Irritability; Worthlessness; Sleep (too much or little) (Despondent, isolation.)   Duration of Depressive symptoms:    Mania:   None   Anxiety:    Worrying; Tension   Psychosis:   None   Duration of Psychotic symptoms:    Trauma:   None   Obsessions:   None   Compulsions:   None   Inattention:  No data recorded  Hyperactivity/Impulsivity:   Feeling of restlessness; Fidgets with hands/feet   Oppositional/Defiant Behaviors:   Angry   Emotional Irregularity:   Recurrent suicidal behaviors/gestures/threats   Other Mood/Personality Symptoms:  No data recorded   Mental Status Exam Appearance and  self-care  Stature:   Average   Weight:   Average weight   Clothing:   Casual   Grooming:   Normal   Cosmetic use:   None   Posture/gait:   Normal   Motor activity:   Not Remarkable   Sensorium  Attention:   Normal   Concentration:   Normal   Orientation:   X5   Recall/memory:   Normal   Affect and Mood  Affect:   Depressed   Mood:   Depressed   Relating  Eye contact:   Normal   Facial expression:   Responsive   Attitude toward examiner:   Cooperative   Thought and Language  Speech flow:  Normal   Thought content:   Appropriate to Mood and Circumstances   Preoccupation:   None    Hallucinations:   None   Organization:  No data recorded  [employer] of Knowledge:   Fair   Intelligence:   Average   Abstraction:  No data recorded  Judgement:   Poor   Reality Testing:   Realistic   Insight:   Fair   Decision Making:   Impulsive   Social Functioning  Social Maturity:   Isolates   Social Judgement:   "Street Smart"   Stress  Stressors:   Other (Comment) (Substance use, mental health, homelessness, unemployed.)   Coping Ability:   Normal   Skill Deficits:   Decision making; Self-control   Supports:   Family     Religion: Religion/Spirituality Are You A Religious Person?: Yes What is Your Religious Affiliation?: Christian  Leisure/Recreation:    Exercise/Diet: Exercise/Diet Do You Exercise?:  (Pt reports, going to the 3-4 days per week.) Do You Follow a Special Diet?: No Do You Have Any Trouble Sleeping?: Yes Explanation of Sleeping Difficulties: Pt reports, getting 4-5 hours of sleep per night.   CCA Employment/Education Employment/Work Situation: Employment / Work Systems developer: Unemployed Has Patient ever Been in Equities trader?: No  Education: Education Is Patient Currently Attending School?: No Last Grade Completed: 12 Did You Product manager?: No   CCA Family/Childhood History Family and Relationship History: Family history Marital status: Single Does patient have children?: No  Childhood History:  Childhood History By whom was/is the patient raised?: Mother Did patient suffer any verbal/emotional/physical/sexual abuse as a child?: No Did patient suffer from severe childhood neglect?: No Has patient ever been sexually abused/assaulted/raped as an adolescent or adult?: No Was the patient ever a victim of a crime or a disaster?: No Witnessed domestic violence?: No  Child/Adolescent Assessment:     CCA Substance Use Alcohol/Drug Use: Alcohol / Drug Use Pain Medications:  See MAR Prescriptions: See MAR Over the Counter: See MAR History of alcohol / drug use?: Yes Negative Consequences of Use: Personal relationships, Financial Withdrawal Symptoms: None Substance #1 Name of Substance 1: Alochol. 1 - Age of First Use: Per pt, at 49. 1 - Amount (size/oz): Pt reports, drinking a couple of beers on Wednesday. 1 - Frequency: Pt reports, once per week. 1 - Duration: Ongoing. 1 - Last Use / Amount: Wednesday. 1 - Method of Aquiring: Purchase. 1- Route of Use: Oral. Substance #2 Name of Substance 2: Methamphetamines. 2 - Age of First Use: Per pt on and off for four years. 2 - Amount (size/oz): Pt reports, using three grams of Methamphetamines from Monday to Wednesday. 2 - Frequency: Pt reports, using daily during a two week period a month. 2 - Duration: Ongoing. 2 -  Last Use / Amount: Wednesday. 2 - Method of Aquiring: Purchase. 2 - Route of Substance Use: Snort or smoke.    ASAM's:  Six Dimensions of Multidimensional Assessment  Dimension 1:  Acute Intoxication and/or Withdrawal Potential:      Dimension 2:  Biomedical Conditions and Complications:      Dimension 3:  Emotional, Behavioral, or Cognitive Conditions and Complications:     Dimension 4:  Readiness to Change:     Dimension 5:  Relapse, Continued use, or Continued Problem Potential:     Dimension 6:  Recovery/Living Environment:     ASAM Severity Score:    ASAM Recommended Level of Treatment:     Substance use Disorder (SUD)    Recommendations for Services/Supports/Treatments: Recommendations for Services/Supports/Treatments Recommendations For Services/Supports/Treatments: Other (Comment) (Pt to be observed and reassessed by psychiatry.)  Discharge Disposition:    DSM5 Diagnoses: Patient Active Problem List   Diagnosis Date Noted   Substance induced mood disorder (HCC) 08/29/2015   Cocaine dependence (HCC) 08/26/2015   Bipolar affective disorder, currently depressed, mild (HCC)  08/26/2015   Bipolar affective disorder (HCC) 11/02/2012     Referrals to Alternative Service(s): Referred to Alternative Service(s):   Place:   Date:   Time:    Referred to Alternative Service(s):   Place:   Date:   Time:    Referred to Alternative Service(s):   Place:   Date:   Time:    Referred to Alternative Service(s):   Place:   Date:   Time:     Redmond Pulling, [facility] Comprehensive Clinical Assessment (CCA) Screening, Triage and Referral Note  11/12/2021 [name] [id number]  Chief Complaint:  Chief Complaint  Patient presents with   Suicidal   Visit Diagnosis:   Patient Reported Information How did you hear about [place]? Other (Comment) (EMS.)  What Is the Reason for Your Visit/Call Today? Per RN note: "Ambulatory to ED with c/o SI with thoughts of running into traffic since Wednesday. Seen for same in the past, discharged with resources - states he never followed up. States he's desperate for help this time, really wants to get his life together. States he drinks a few beers a day, but none since Wednesday."  How Long Has This Been Causing You Problems? <Week  What Do You Feel Would Help You the Most Today? Alcohol or Drug Use Treatment; Treatment for Depression or other mood problem   Have You Recently Had Any Thoughts About Hurting Yourself? Yes  Are You Planning to Commit Suicide/Harm Yourself At This time? Yes   Have you Recently Had Thoughts About Hurting Someone [name]? No  Are You Planning to Harm Someone at This Time? No  Explanation: No data recorded  Have You Used Any Alcohol or Drugs in the Past 24 Hours? Yes  How Long Ago Did You Use Drugs or Alcohol? No data recorded What Did You Use and How Much? No data recorded  Do You Currently Have a Therapist/Psychiatrist? No data recorded Name of Therapist/Psychiatrist: No data recorded  Have You Been Recently Discharged From Any Office Practice or Programs? No data recorded Explanation of Discharge  From Practice/Program: No data recorded   CCA Screening Triage Referral Assessment Type of Contact: Tele-Assessment  Telemedicine Service Delivery: Telemedicine service delivery: This service was provided via telemedicine using a 2-way, interactive audio and video technology  Is this Initial or Reassessment? Initial Assessment  Date Telepsych consult ordered in [facility]:  11/11/21  Time Telepsych consult ordered in  [facility]:  2137  Location of Assessment: [facility]  Provider Location: [facility] Assessment Services   Collateral Involvement: Pt reports, he has supports but declined for clinician to contact anyone to obtain additional information.   Does Patient Have a Automotive engineer Guardian? No data recorded Name and Contact of Legal Guardian: No data recorded If Minor and Not Living with Parent(s), Who has Custody? No data recorded Is CPS involved or ever been involved? No data recorded Is APS involved or ever been involved? No data recorded  Patient Determined To Be At Risk for Harm To Self or Others Based on Review of Patient Reported Information or Presenting Complaint? Yes, for Self-Harm  Method: No data recorded Availability of Means: No data recorded Intent: No data recorded Notification Required: No data recorded Additional Information for Danger to Others Potential: No data recorded Additional Comments for Danger to Others Potential: No data recorded Are There Guns or Other Weapons in Your Home? No data recorded Types of Guns/Weapons: No data recorded Are These Weapons Safely Secured?                            No data recorded Who Could Verify You Are Able To Have These Secured: No data recorded Do You Have any Outstanding Charges, Pending Court Dates, Parole/Probation? No data recorded Contacted To Inform of Risk of Harm To Self or Others: No data recorded  Does Patient Present under Involuntary Commitment? No  IVC Papers Initial File Date: No data recorded  [place] of  Residence: [place]   Patient Currently Receiving the Following Services: Medication Management   Determination of Need: Urgent (48 hours)   Options For Referral: Medication Management; BH Urgent Care; Outpatient Therapy; Inpatient Hospitalization   Discharge Disposition:     Redmond Pulling, [facility]       Redmond Pulling, [place], [facility], [facility] Triage Specialist [phone]

## 2021-11-12 NOTE — ED Notes (Signed)
Pt dressed out into burgundy scrubs wanded by security and belonging includes clothing shoes wallet cell phone and keys located in cabinet behind nurses station 9-12

## 2021-11-12 NOTE — ED Notes (Addendum)
Patient denies SI with plan . States he was SI with plan this morning when he was coming down from his high . Patient now states she is not SI with no plan . States he wants treatment for drugs and alcohol .  NP made aware . Patient is still on continuous 1:1 observation .

## 2021-11-13 ENCOUNTER — Other Ambulatory Visit (INDEPENDENT_AMBULATORY_CARE_PROVIDER_SITE_OTHER)
Admission: EM | Admit: 2021-11-13 | Discharge: 2021-11-14 | Disposition: A | Discharge status: Home / Self Care | Payer: Medicare HMO | Source: Home / Self Care | Attending: Psychiatry | Admitting: Psychiatry

## 2021-11-13 ENCOUNTER — Encounter (HOSPITAL_COMMUNITY): Payer: Self-pay

## 2021-11-13 ENCOUNTER — Encounter (HOSPITAL_COMMUNITY): Payer: Self-pay | Admitting: Urology

## 2021-11-13 DIAGNOSIS — F319 Bipolar disorder, unspecified: Secondary | ICD-10-CM | POA: Insufficient documentation

## 2021-11-13 DIAGNOSIS — F310 Bipolar disorder, current episode hypomanic: Secondary | ICD-10-CM

## 2021-11-13 DIAGNOSIS — F3131 Bipolar disorder, current episode depressed, mild: Secondary | ICD-10-CM | POA: Diagnosis not present

## 2021-11-13 DIAGNOSIS — F1911 Other psychoactive substance abuse, in remission: Secondary | ICD-10-CM | POA: Insufficient documentation

## 2021-11-13 DIAGNOSIS — Z79899 Other long term (current) drug therapy: Secondary | ICD-10-CM | POA: Insufficient documentation

## 2021-11-13 DIAGNOSIS — R45851 Suicidal ideations: Secondary | ICD-10-CM | POA: Insufficient documentation

## 2021-11-13 DIAGNOSIS — F1910 Other psychoactive substance abuse, uncomplicated: Secondary | ICD-10-CM | POA: Diagnosis present

## 2021-11-13 HISTORY — DX: Other psychoactive substance abuse, uncomplicated: F19.10

## 2021-11-13 LAB — SARS CORONAVIRUS 2 (TAT 6-24 HRS): SARS Coronavirus 2: NEGATIVE

## 2021-11-13 LAB — VALPROIC ACID LEVEL: Valproic Acid Lvl: 58 ug/mL (ref 50.0–100.0)

## 2021-11-13 MED ORDER — BUSPIRONE HCL 15 MG PO TABS
7.5000 mg | ORAL_TABLET | Freq: Two times a day (BID) | ORAL | Status: DC
  Administered 2021-11-13 – 2021-11-14 (×3): 7.5 mg via ORAL
  Filled 2021-11-13: qty 14
  Filled 2021-11-13: qty 1
  Filled 2021-11-13: qty 7
  Filled 2021-11-13 (×2): qty 1

## 2021-11-13 MED ORDER — HYDROXYZINE HCL 25 MG PO TABS: 25.0000 mg | ORAL_TABLET | Freq: Three times a day (TID) | ORAL | Status: DC | PRN

## 2021-11-13 MED ORDER — OLANZAPINE 10 MG PO TABS
10.0000 mg | ORAL_TABLET | Freq: Every day | ORAL | Status: DC
  Administered 2021-11-13: 10 mg via ORAL
  Filled 2021-11-13: qty 7
  Filled 2021-11-13: qty 1

## 2021-11-13 MED ORDER — BENZTROPINE MESYLATE 0.5 MG PO TABS
0.5000 mg | ORAL_TABLET | Freq: Every day | ORAL | Status: DC
  Administered 2021-11-13: 0.5 mg via ORAL
  Filled 2021-11-13: qty 7
  Filled 2021-11-13: qty 1

## 2021-11-13 MED ORDER — DIVALPROEX SODIUM 500 MG PO DR TAB
500.0000 mg | DELAYED_RELEASE_TABLET | Freq: Two times a day (BID) | ORAL | Status: DC
  Administered 2021-11-13 – 2021-11-14 (×3): 500 mg via ORAL
  Filled 2021-11-13: qty 1
  Filled 2021-11-13: qty 14
  Filled 2021-11-13 (×2): qty 1

## 2021-11-13 MED ORDER — MAGNESIUM HYDROXIDE 400 MG/5ML PO SUSP: 30.0000 mL | Freq: Every day | ORAL | Status: DC | PRN

## 2021-11-13 MED ORDER — FLUOXETINE HCL 20 MG PO CAPS
20.0000 mg | ORAL_CAPSULE | Freq: Every day | ORAL | Status: DC
  Administered 2021-11-13 – 2021-11-14 (×2): 20 mg via ORAL
  Filled 2021-11-13 (×2): qty 1
  Filled 2021-11-13: qty 7

## 2021-11-13 MED ORDER — ALUM & MAG HYDROXIDE-SIMETH 200-200-20 MG/5ML PO SUSP: 30.0000 mL | ORAL | Status: DC | PRN

## 2021-11-13 MED ORDER — ACETAMINOPHEN 325 MG PO TABS: 650.0000 mg | ORAL_TABLET | Freq: Four times a day (QID) | ORAL | Status: DC | PRN

## 2021-11-13 MED ORDER — TRAZODONE HCL 50 MG PO TABS: 50.0000 mg | ORAL_TABLET | Freq: Every evening | ORAL | Status: DC | PRN

## 2021-11-13 NOTE — ED Notes (Signed)
Received patient in no acute distress breathing spontaneously to room air . All property removed . Patient on continuous observation . All safety measures maintained .

## 2021-11-13 NOTE — Progress Notes (Signed)
[name] refused dinner after he was told the menu, spaghetti and squash.He is lying awake in his bed , calm without distress.

## 2021-11-13 NOTE — ED Notes (Addendum)
Pt admitted to [facility] due to substance use. Pt states his goal is to go to the [employer]. Pt denies ever having SI/HI/AVH. Pt A&O x4, calm and cooperative. Pt tolerated admission process and skin assessment well. Pt ambulated independently to unit. Oriented to unit/staff. Sandwich, cookie, and OJ provided per pt request. No signs of acute distress noted. Will continue to monitor for safety.

## 2021-11-13 NOTE — ED Notes (Signed)
Pt sleeping@this time. Breathing even and unlabored. Will continue to monitor for safety 

## 2021-11-13 NOTE — ED Notes (Signed)
Pt in room calm and cooperative, no c/o pain or distress. Will continue to monitor for safety

## 2021-11-13 NOTE — ED Notes (Signed)
Pt refused breakfast this morning.

## 2021-11-13 NOTE — ED Notes (Signed)
Called safe transport to pick up patient . States they will be here in 10 minutes.

## 2021-11-13 NOTE — BH IP Treatment Plan (Signed)
Interdisciplinary Treatment and Diagnostic Plan Update  11/13/2021 Time of Session: 1:00PM  [name] MRN: [id number]  Diagnosis:  Final diagnoses:  Substance abuse (HCC)  Bipolar affective disorder, current episode hypomanic (HCC)     Current Medications:  Current Facility-Administered Medications  Medication Dose Route Frequency Provider Last Rate Last Admin   acetaminophen (TYLENOL) tablet 650 mg  650 mg Oral Q6H PRN Ajibola, Ene A, NP       alum & mag hydroxide-simeth (MAALOX/MYLANTA) 200-200-20 MG/5ML suspension 30 mL  30 mL Oral Q4H PRN Ajibola, Ene A, NP       benztropine (COGENTIN) tablet 0.5 mg  0.5 mg Oral QHS Ajibola, Ene A, NP       busPIRone (BUSPAR) tablet 7.5 mg  7.5 mg Oral BID Ajibola, Ene A, NP   7.5 mg at 11/13/21 1006   divalproex (DEPAKOTE) DR tablet 500 mg  500 mg Oral BID Ajibola, Ene A, NP   500 mg at 11/13/21 1007   FLUoxetine (PROZAC) capsule 20 mg  20 mg Oral Daily Ajibola, Ene A, NP   20 mg at 11/13/21 1006   hydrOXYzine (ATARAX) tablet 25 mg  25 mg Oral TID PRN Ajibola, Ene A, NP       magnesium hydroxide (MILK OF MAGNESIA) suspension 30 mL  30 mL Oral Daily PRN Ajibola, Ene A, NP       OLANZapine (ZYPREXA) tablet 10 mg  10 mg Oral QHS Ajibola, Ene A, NP       traZODone (DESYREL) tablet 50 mg  50 mg Oral QHS PRN Ajibola, Ene A, NP       Current Outpatient Medications  Medication Sig Dispense Refill   benztropine (COGENTIN) 0.5 MG tablet Take 0.5 mg by mouth at bedtime.     busPIRone (BUSPAR) 7.5 MG tablet Take 7.5 mg by mouth 2 (two) times daily.     divalproex (DEPAKOTE) 500 MG DR tablet Take 500 mg by mouth 2 (two) times daily.     FLUoxetine (PROZAC) 20 MG capsule Take 20 mg by mouth every morning.     LORazepam (ATIVAN) 0.5 MG tablet Take 1 tablet (0.5 mg total) by mouth every 8 (eight) hours as needed for up to 6 doses for anxiety. 6 tablet 0   OLANZapine (ZYPREXA) 10 MG tablet Take 10 mg by mouth at bedtime.     sildenafil (VIAGRA) 50 MG  tablet Take 50 mg by mouth daily as needed for erectile dysfunction.     PTA Medications: Prior to Admission medications   Medication Sig Start Date End Date Taking? Authorizing Provider  benztropine (COGENTIN) 0.5 MG tablet Take 0.5 mg by mouth at bedtime. 10/14/21   [provider]  busPIRone (BUSPAR) 7.5 MG tablet Take 7.5 mg by mouth 2 (two) times daily. 05/17/21   [provider]  divalproex (DEPAKOTE) 500 MG DR tablet Take 500 mg by mouth 2 (two) times daily. 10/14/21   [provider]  FLUoxetine (PROZAC) 20 MG capsule Take 20 mg by mouth every morning. 10/14/21   [provider]  LORazepam (ATIVAN) 0.5 MG tablet Take 1 tablet (0.5 mg total) by mouth every 8 (eight) hours as needed for up to 6 doses for anxiety. 10/10/21   Earney Navy, NP  OLANZapine (ZYPREXA) 10 MG tablet Take 10 mg by mouth at bedtime. 10/14/21   [provider]  sildenafil (VIAGRA) 50 MG tablet Take 50 mg by mouth daily as needed for erectile dysfunction. 09/07/21   [provider]    Patient Stressors: Substance abuse    Patient Strengths: Motivation for treatment/growth   Treatment Modalities: Medication Management, Group therapy, Case management,  1 to 1 session with clinician, Psychoeducation, Recreational therapy.   Physician Treatment Plan for Primary and Secondary Diagnosis:  Final diagnoses:  Substance abuse (HCC)  Bipolar affective disorder, current episode hypomanic (HCC)   Long Term Goal(s): Improvement in symptoms so as ready for discharge  Short Term Goals: Patient will verbalize feelings in meetings with treatment team members. Patient will attend at least of 50% of the groups daily. Pt will complete the PHQ9 on admission, day 3 and discharge. Patient will participate in completing the [place] Suicide Severity Rating Scale q shift. Patient will remain free of seclusion/restraint/assault incidents 24 hours prior to discharge. Patient will  take medications as prescribed daily.  Medication Management: Evaluate patient's response, side effects, and tolerance of medication regimen.  Therapeutic Interventions: 1 to 1 sessions, Unit Group sessions and Medication administration.  Evaluation of Outcomes: Progressing  LCSW Treatment Plan for Primary Diagnosis:  Final diagnoses:  Substance abuse (HCC)  Bipolar affective disorder, current episode hypomanic (HCC)    Long Term Goal(s): Safe transition to appropriate next level of care at discharge, Engage patient in therapeutic group addressing interpersonal concerns.  Short Term Goals: Facilitate acceptance of mental health diagnosis and concerns through verbal commitment to aftercare plan and appointments at discharge. and Identify minimum of 2 triggers associated with mental health/substance abuse issues with treatment team members.  Therapeutic Interventions: Assess for all discharge needs, 1 to 1 time with Child psychotherapist, Explore available resources and support systems, Assess for adequacy in community support network, Educate family and significant other(s) on suicide prevention, Complete Psychosocial Assessment, Interpersonal group therapy.  Evaluation of Outcomes: Progressing   Progress in Treatment: Attending groups: No. Participating in groups: No. Taking medication as prescribed: Yes. Toleration medication: Yes. Family/Significant other contact made: No, will contact:  no one Patient understands diagnosis: Yes. Discussing patient identified problems/goals with staff: Yes. Medical problems stabilized or resolved: Yes. Denies suicidal/homicidal ideation: Yes. Issues/concerns per patient self-inventory: No. Other: None   New problem(s) identified: None   New Short Term/Long Term Goal(s): [name] reports he wants to engage in sober-living programming to ensure his sobriety at discharge.   Patient Goals:  "I needed to detox and I wanted [facility] get [facility]"    Discharge Plan or Barriers: [name] plans to discharge to [facility] for sober living programming. [name] will be provided with appropriate outpatient resources.   Reason for Continuation of Hospitalization: Depression Medication stabilization  Estimated Length of Stay: 3-5 days  Last 3 [place] Suicide Severity Risk Score: Flowsheet Row ED from 11/13/2021 in [facility] ED from 11/11/2021 in [facility] from 10/08/2021 in [facility] [place] [facility]  C-SSRS RISK CATEGORY No Risk High Risk No Risk       Last PHQ 2/9 Scores:    11/13/2021    2:27 PM  Depression screen PHQ 2/9  Decreased Interest 0  Down, Depressed, Hopeless 1  PHQ - 2 Score 1    Scribe for Treatment Team: Karren Burly 11/13/2021 2:47 PM

## 2021-11-13 NOTE — Progress Notes (Cosign Needed)
Behavioral Health Progress Note  Date and Time: 11/13/2021 2:54 PM Name: [name] MRN:  [id number]  Subjective:  [name] is a 41 year old male with psychiatric history of polysubstance abuse, substance-induced mood disorder, bipolar disorder, and suicidal ideation. Patient presented voluntarily to [facility] due to suicidal ideation with plan to run into traffic and requesting substance abuse treatment.  On evaluation today, [name] was laying in bed with the lights off. He had good eye contact, spoke softly but answered all questions with elaboration. He had a depressed affect and stated that he is "better than I was". He presented to the ED after a "bad methamphetamine relapse", and says that he "needs help ASAP", meth use is "out of control and I lost everything". Stressors that support drug use are unemployment, homelessness, money and personal relationship problems. He admits using 1 gm meth per day by snorting or smoking, denied IV use, on and off for the last 4 years with the longest sober period less than 30 days. He has also been drinking 2 cans of beer per day, but there are days where he does not drink and he denied any symptoms of alcohol withdrawal. He is interested in a long-term rehabilitation program- specifically [facility], though he was agreeable to receiving information about other programs from the CSW. He has completed a rehabilitation program in [place] but was unable to tell us when. He last saw his PCP for medication and treatment of bipolar in the last 30 days, denies manic symptoms greater than 4 days. He denied current suicidal ideations, thoughts of self-harm, or thoughts of hurting others, denied access to firearms, denied current auditory or visual hallucinations. Denied history of physical, emotional, or sexual abuse.  Diagnosis:  Final diagnoses:  Substance abuse (HCC)  Bipolar affective disorder, current episode hypomanic (HCC)    Total Time spent  with patient: 30 minutes  Past Psychiatric History: polysubstance abuse, substance-induced mood disorder, bipolar disorder, and suicidal ideation Past Medical History:  Past Medical History:  Diagnosis Date   Bipolar 1 disorder (HCC)    Depression    Heart murmur    Substance abuse (HCC)     Past Surgical History:  Procedure Laterality Date   BACK SURGERY  2011   had melanoma mole removed   Family History:  Family History  Problem Relation Age of Onset   Heart Problems Father    Family Psychiatric  History: Denied Social History:  Social History   Substance and Sexual Activity  Alcohol Use Yes   Comment: 2 cans of beer most days     Social History   Substance and Sexual Activity  Drug Use Yes   Types: Cocaine, Amphetamines   Comment: snort/smoke 1 gm methamphetamine daily, "on-and-off" for 4 years.    Social History   Socioeconomic History   Marital status: Single    Spouse name: Not on file   Number of children: Not on file   Years of education: Not on file   Highest education level: Not on file  Occupational History   Not on file  Tobacco Use   Smoking status: Former    Packs/day: 1.00    Years: 5.00    Total pack years: 5.00    Types: Cigarettes    Quit date: 06/04/2013    Years since quitting: 8.4   Smokeless tobacco: Never  Substance and Sexual Activity   Alcohol use: Yes    Comment: 2 cans of beer most days   Drug use:  Yes    Types: Cocaine, Amphetamines    Comment: snort/smoke 1 gm methamphetamine daily, "on-and-off" for 4 years.   Sexual activity: Not Currently  Other Topics Concern   Not on file  Social History Narrative   Not on file   Social Determinants of Health   Financial Resource Strain: Not on file  Food Insecurity: Not on file  Transportation Needs: Not on file  Physical Activity: Not on file  Stress: Not on file  Social Connections: Not on file   SDOH:  SDOH Screenings   Alcohol Screen: Not on file  Depression (PHQ2-9):  Low Risk  (11/13/2021)   Depression (PHQ2-9)    PHQ-2 Score: 1  Financial Resource Strain: Not on file  Food Insecurity: Not on file  Housing: Not on file  Physical Activity: Not on file  Social Connections: Not on file  Stress: Not on file  Tobacco Use: Medium Risk (11/13/2021)   Patient History    Smoking Tobacco Use: Former    Smokeless Tobacco Use: Never    Passive Exposure: Not on file  Transportation Needs: Not on file   Additional Social History:                         Sleep: Fair  Appetite:  NA  Current Medications:  Current Facility-Administered Medications  Medication Dose Route Frequency Provider Last Rate Last Admin   acetaminophen (TYLENOL) tablet 650 mg  650 mg Oral Q6H PRN Ajibola, Ene A, NP       alum & mag hydroxide-simeth (MAALOX/MYLANTA) 200-200-20 MG/5ML suspension 30 mL  30 mL Oral Q4H PRN Ajibola, Ene A, NP       benztropine (COGENTIN) tablet 0.5 mg  0.5 mg Oral QHS Ajibola, Ene A, NP       busPIRone (BUSPAR) tablet 7.5 mg  7.5 mg Oral BID Ajibola, Ene A, NP   7.5 mg at 11/13/21 1006   divalproex (DEPAKOTE) DR tablet 500 mg  500 mg Oral BID Ajibola, Ene A, NP   500 mg at 11/13/21 1007   FLUoxetine (PROZAC) capsule 20 mg  20 mg Oral Daily Ajibola, Ene A, NP   20 mg at 11/13/21 1006   hydrOXYzine (ATARAX) tablet 25 mg  25 mg Oral TID PRN Ajibola, Ene A, NP       magnesium hydroxide (MILK OF MAGNESIA) suspension 30 mL  30 mL Oral Daily PRN Ajibola, Ene A, NP       OLANZapine (ZYPREXA) tablet 10 mg  10 mg Oral QHS Ajibola, Ene A, NP       traZODone (DESYREL) tablet 50 mg  50 mg Oral QHS PRN Ajibola, Ene A, NP       Current Outpatient Medications  Medication Sig Dispense Refill   benztropine (COGENTIN) 0.5 MG tablet Take 0.5 mg by mouth at bedtime.     busPIRone (BUSPAR) 7.5 MG tablet Take 7.5 mg by mouth 2 (two) times daily.     divalproex (DEPAKOTE) 500 MG DR tablet Take 500 mg by mouth 2 (two) times daily.     FLUoxetine (PROZAC) 20 MG capsule  Take 20 mg by mouth every morning.     LORazepam (ATIVAN) 0.5 MG tablet Take 1 tablet (0.5 mg total) by mouth every 8 (eight) hours as needed for up to 6 doses for anxiety. 6 tablet 0   OLANZapine (ZYPREXA) 10 MG tablet Take 10 mg by mouth at bedtime.     sildenafil (VIAGRA) 50 MG tablet  Take 50 mg by mouth daily as needed for erectile dysfunction.      Labs  Lab Results:  Admission on 11/13/2021  Component Date Value Ref Range Status   Valproic Acid Lvl 11/13/2021 58  50.0 - 100.0 ug/mL Final   Performed at [facility], [address], [place], [place] [address]  Admission on 11/11/2021, Discharged on 11/13/2021  Component Date Value Ref Range Status   Sodium 11/11/2021 142  135 - 145 mmol/L Final   Potassium 11/11/2021 3.6  3.5 - 5.1 mmol/L Final   Chloride 11/11/2021 109  98 - 111 mmol/L Final   CO2 11/11/2021 27  22 - 32 mmol/L Final   Glucose, Bld 11/11/2021 92  70 - 99 mg/dL Final   Glucose reference range applies only to samples taken after fasting for at least 8 hours.   BUN 11/11/2021 12  6 - 20 mg/dL Final   Creatinine, Ser 11/11/2021 1.02  0.61 - 1.24 mg/dL Final   Calcium 32/44/0102 8.9  8.9 - 10.3 mg/dL Final   Total Protein 72/53/6644 6.9  6.5 - 8.1 g/dL Final   Albumin 03/47/4259 3.9  3.5 - 5.0 g/dL Final   AST 56/38/7564 19  15 - 41 U/L Final   ALT 11/11/2021 35  0 - 44 U/L Final   Alkaline Phosphatase 11/11/2021 53  38 - 126 U/L Final   Total Bilirubin 11/11/2021 1.1  0.3 - 1.2 mg/dL Final   GFR, Estimated 11/11/2021 >60  >60 mL/min Final   Comment: (NOTE) Calculated using the CKD-EPI Creatinine Equation (2021)    Anion gap 11/11/2021 6  5 - 15 Final   Performed at [facility], [address], [place], [place] [address]   Alcohol, Ethyl (B) 11/11/2021 <10  <10 mg/dL Final   Comment: (NOTE) Lowest detectable limit for serum alcohol is 10 mg/dL.  For medical purposes only. Performed at [facility], [address] [place], [place] [address]    Salicylate Lvl 11/11/2021 <7.0 (L)  7.0 - 30.0 mg/dL Final   Performed at [facility], [address], [place], [place] [address]   Acetaminophen (Tylenol), Serum 11/11/2021 <10 (L)  10 - 30 ug/mL Final   Comment: (NOTE) Therapeutic concentrations vary significantly. A range of 10-30 ug/mL  may be an effective concentration for many patients. However, some  are best treated at concentrations outside of this range. Acetaminophen concentrations >150 ug/mL at 4 hours after ingestion  and >50 ug/mL at 12 hours after ingestion are often associated with  toxic reactions.  Performed at [facility], [address], [place], [place] [address]    WBC 11/11/2021 5.9  4.0 - 10.5 K/uL Final   RBC 11/11/2021 4.54  4.22 - 5.81 MIL/uL Final   Hemoglobin 11/11/2021 13.8  13.0 - 17.0 g/dL Final   HCT 09/32/3557 43.1  39.0 - 52.0 % Final   MCV 11/11/2021 94.9  80.0 - 100.0 fL Final   MCH 11/11/2021 30.4  26.0 - 34.0 pg Final   MCHC 11/11/2021 32.0  30.0 - 36.0 g/dL Final   RDW 32/20/2542 13.3  11.5 - 15.5 % Final   Platelets 11/11/2021 247  150 - 400 K/uL Final   nRBC 11/11/2021 0.0  0.0 - 0.2 % Final   Performed at [facility], [address] [place], [place] [address]   Opiates 11/11/2021 NONE DETECTED  NONE DETECTED Final   Cocaine 11/11/2021 NONE DETECTED  NONE  DETECTED Final   Benzodiazepines 11/11/2021 NONE DETECTED  NONE DETECTED Final   Amphetamines 11/11/2021 POSITIVE (A)  NONE DETECTED Final   Tetrahydrocannabinol 11/11/2021 POSITIVE (A)  NONE DETECTED Final   Barbiturates 11/11/2021 NONE DETECTED  NONE DETECTED Final   Comment: (NOTE) DRUG SCREEN FOR MEDICAL PURPOSES ONLY.  IF CONFIRMATION IS NEEDED FOR ANY PURPOSE, NOTIFY LAB WITHIN 5 DAYS.  LOWEST DETECTABLE LIMITS FOR URINE DRUG SCREEN Drug Class                     Cutoff (ng/mL) Amphetamine and metabolites    1000 Barbiturate and  metabolites    200 Benzodiazepine                 200 Tricyclics and metabolites     300 Opiates and metabolites        300 Cocaine and metabolites        300 THC                            50 Performed at [facility], [address] [place], [place] [address]    SARS Coronavirus 2 11/12/2021 NEGATIVE  NEGATIVE Final   Comment: (NOTE) SARS-CoV-2 target nucleic acids are NOT DETECTED.  The SARS-CoV-2 RNA is generally detectable in upper and lower respiratory specimens during the acute phase of infection. Negative results do not preclude SARS-CoV-2 infection, do not rule out co-infections with other pathogens, and should not be used as the sole basis for treatment or other patient management decisions. Negative results must be combined with clinical observations, patient history, and epidemiological information. The expected result is Negative.  Fact Sheet for Patients: HairSlick.no  Fact Sheet for Healthcare Providers: quierodirigir.com  This test is not yet approved or cleared by the [place] FDA and  has been authorized for detection and/or diagnosis of SARS-CoV-2 by FDA under an Emergency Use Authorization (EUA). This EUA will remain  in effect (meaning this test can be used) for the duration of the COVID-19 declaration under Se                          ction 564(b)(1) of the Act, 21 U.S.C. section 360bbb-3(b)(1), unless the authorization is terminated or revoked sooner.  Performed at [facility], [address] [place], [place] [address]    SARS Coronavirus 2 by RT PCR 11/12/2021 NEGATIVE  NEGATIVE Final   Comment: (NOTE) SARS-CoV-2 target nucleic acids are NOT DETECTED.  The SARS-CoV-2 RNA is generally detectable in upper and lower respiratory specimens during the acute phase of infection. The lowest concentration of SARS-CoV-2 viral copies this assay can detect is 250 copies /  mL. A negative result does not preclude SARS-CoV-2 infection and should not be used as the sole basis for treatment or other patient management decisions.  A negative result may occur with improper specimen collection / handling, submission of specimen other than nasopharyngeal swab, presence of viral mutation(s) within the areas targeted by this assay, and inadequate number of viral copies (<250 copies / mL). A negative result must be combined with clinical observations, patient history, and epidemiological information.  Fact Sheet for Patients:   RoadLapTop.co.za  Fact Sheet for Healthcare Providers: http://kim-miller.com/  This test is not yet approved or  cleared by the [place] FDA and has been authorized for detection and/or diagnosis of SARS-CoV-2 by FDA under an Emergency Use Authorization (EUA).  This EUA will remain in effect (meaning this test can be used) for the duration of the COVID-19 declaration under Section 564(b)(1) of the Act, 21 U.S.C. section 360bbb-3(b)(1), unless the authorization is terminated or revoked sooner.  Performed at [facility], [address] [place], [place] [address]   Admission on 10/08/2021, Discharged on 10/11/2021  Component Date Value Ref Range Status   Sodium 10/08/2021 136  135 - 145 mmol/L Final   Potassium 10/08/2021 3.7  3.5 - 5.1 mmol/L Final   Chloride 10/08/2021 102  98 - 111 mmol/L Final   CO2 10/08/2021 23  22 - 32 mmol/L Final   Glucose, Bld 10/08/2021 101 (H)  70 - 99 mg/dL Final   Glucose reference range applies only to samples taken after fasting for at least 8 hours.   BUN 10/08/2021 19  6 - 20 mg/dL Final   Creatinine, Ser 10/08/2021 1.08  0.61 - 1.24 mg/dL Final   Calcium 60/45/409805/12/2021 9.0  8.9 - 10.3 mg/dL Final   Total Protein 11/91/478205/12/2021 7.8  6.5 - 8.1 g/dL Final   Albumin 95/62/130805/12/2021 4.6  3.5 - 5.0 g/dL Final   AST 65/78/469605/12/2021  44 (H)  15 - 41 U/L Final   ALT 10/08/2021 52 (H)  0 - 44 U/L Final   Alkaline Phosphatase 10/08/2021 59  38 - 126 U/L Final   Total Bilirubin 10/08/2021 2.0 (H)  0.3 - 1.2 mg/dL Final   GFR, Estimated 10/08/2021 >60  >60 mL/min Final   Comment: (NOTE) Calculated using the CKD-EPI Creatinine Equation (2021)    Anion gap 10/08/2021 11  5 - 15 Final   Performed at [facility], [address] [place], [place] [address]   Salicylate Lvl 10/08/2021 <7.0 (L)  7.0 - 30.0 mg/dL Final   Performed at [facility], [address] [place], [place] [address]   Acetaminophen (Tylenol), Serum 10/08/2021 <10 (L)  10 - 30 ug/mL Final   Comment: (NOTE) Therapeutic concentrations vary significantly. A range of 10-30 ug/mL  may be an effective concentration for many patients. However, some  are best treated at concentrations outside of this range. Acetaminophen concentrations >150 ug/mL at 4 hours after ingestion  and >50 ug/mL at 12 hours after ingestion are often associated with  toxic reactions.  Performed at [facility], [address] [place], [place] [address]    Alcohol, Ethyl (B) 10/08/2021 <10  <10 mg/dL Final   Comment: (NOTE) Lowest detectable limit for serum alcohol is 10 mg/dL.  For medical purposes only. Performed at [facility], [address] [place], [place] [address]    Opiates 10/08/2021 NONE DETECTED  NONE DETECTED Final   Cocaine 10/08/2021 NONE DETECTED  NONE DETECTED Final   Benzodiazepines 10/08/2021 POSITIVE (A)  NONE DETECTED Final   Amphetamines 10/08/2021 POSITIVE (A)  NONE DETECTED Final   Tetrahydrocannabinol 10/08/2021 NONE DETECTED  NONE DETECTED Final   Barbiturates 10/08/2021 NONE DETECTED  NONE DETECTED Final   Comment: (NOTE) DRUG SCREEN FOR MEDICAL PURPOSES ONLY.  IF CONFIRMATION IS NEEDED FOR ANY PURPOSE, NOTIFY LAB WITHIN 5 DAYS.  LOWEST DETECTABLE LIMITS FOR URINE DRUG  SCREEN Drug Class                     Cutoff (ng/mL) Amphetamine and metabolites  1000 Barbiturate and metabolites    200 Benzodiazepine                 200 Tricyclics and metabolites     300 Opiates and metabolites        300 Cocaine and metabolites        300 THC                            50 Performed at [facility], [address], [place], [place] [address]    WBC 10/08/2021 12.6 (H)  4.0 - 10.5 K/uL Final   RBC 10/08/2021 4.65  4.22 - 5.81 MIL/uL Final   Hemoglobin 10/08/2021 14.5  13.0 - 17.0 g/dL Final   HCT 52/84/1324 42.1  39.0 - 52.0 % Final   MCV 10/08/2021 90.5  80.0 - 100.0 fL Final   MCH 10/08/2021 31.2  26.0 - 34.0 pg Final   MCHC 10/08/2021 34.4  30.0 - 36.0 g/dL Final   RDW 40/03/2724 12.9  11.5 - 15.5 % Final   Platelets 10/08/2021 247  150 - 400 K/uL Final   nRBC 10/08/2021 0.0  0.0 - 0.2 % Final   Neutrophils Relative % 10/08/2021 68  % Final   Neutro Abs 10/08/2021 8.6 (H)  1.7 - 7.7 K/uL Final   Lymphocytes Relative 10/08/2021 17  % Final   Lymphs Abs 10/08/2021 2.1  0.7 - 4.0 K/uL Final   Monocytes Relative 10/08/2021 14  % Final   Monocytes Absolute 10/08/2021 1.8 (H)  0.1 - 1.0 K/uL Final   Eosinophils Relative 10/08/2021 1  % Final   Eosinophils Absolute 10/08/2021 0.1  0.0 - 0.5 K/uL Final   Basophils Relative 10/08/2021 0  % Final   Basophils Absolute 10/08/2021 0.0  0.0 - 0.1 K/uL Final   Immature Granulocytes 10/08/2021 0  % Final   Abs Immature Granulocytes 10/08/2021 0.03  0.00 - 0.07 K/uL Final   Performed at [facility], [address], [place], [place] [address]    Blood Alcohol level:  Lab Results  Component Value Date   ETH <10 11/11/2021   ETH <10 10/08/2021    Metabolic Disorder Labs: No results found for: "HGBA1C", "MPG" No results found for: "PROLACTIN" No results found for: "CHOL", "TRIG", "HDL", "CHOLHDL", "VLDL", "LDLCALC"  Therapeutic Lab Levels: No results found for:  "LITHIUM" Lab Results  Component Value Date   VALPROATE 58 11/13/2021   No results found for: "CBMZ"  Physical Findings   AIMS    Flowsheet Row Admission (Discharged) from 08/29/2015 [facility] Admission (Discharged) from 08/26/2015 in [facility]  AIMS Total Score 0 0      AUDIT    Flowsheet Row Admission (Discharged) from 08/29/2015 in [facility] Admission (Discharged) from 08/26/2015 in [facility] Ad[facility] (Discharged) [facility]  Alcohol Use Disorder Identification Test Final Score (AUDIT) PHQ2-9    Flowsheet Row ED from 11/13/2021 in [facility]  PHQ-2 Total Score 1      Flowsheet Row ED from 11/13/2021 in [facility] ED from 11/11/2021 in [facility] from 10/08/2021 in [facility]  C-SSRS RISK CATEGORY No Risk High Risk No Risk        Musculoskeletal  Strength &  Muscle Tone: within normal limits Gait & Station: normal Patient leans: N/A  Psychiatric Specialty Exam  Presentation  General Appearance: Appropriate for Environment; Casual  Eye Contact:Good  Speech:Clear and Coherent; Normal Rate  Speech Volume:Normal  Handedness:Right   Mood and Affect  Mood:-- ("better than i was')  Affect:Appropriate; Congruent   Thought Process  Thought Processes:Coherent; Linear  Descriptions of Associations:Intact  Orientation:Full (Time, Place and Person)  Thought Content:WDL; Logical     Hallucinations:Hallucinations: None  Ideas of Reference:None  Suicidal Thoughts:Suicidal Thoughts: No  Homicidal Thoughts:Homicidal Thoughts: No   Sensorium  Memory:Immediate Good; Recent Good; Remote Good  Judgment:Fair  Insight:Fair   Executive Functions   Concentration:Good  Attention Span:Good  Recall:Good  Fund of Knowledge:Good  Language:Good   Psychomotor Activity  Psychomotor Activity:Psychomotor Activity: Normal   Assets  Assets:Communication Skills; Desire for Improvement; Resilience; Physical Health   Sleep  Sleep:Sleep: Fair Number of Hours of Sleep: 4   Nutritional Assessment (For OBS and [facility]BC admissions only) Has the patient had a weight loss or gain of 10 pounds or more in the last 3 months?: No Has the patient had a decrease in food intake/or appetite?: No Does the patient have dental problems?: No Does the patient have eating habits or behaviors that may be indicators of an eating disorder including binging or inducing vomiting?: No Has the patient recently lost weight without trying?: 0 Has the patient been eating poorly because of a decreased appetite?: 0 Malnutrition Screening Tool Score: 0    Physical Exam  Physical Exam Vitals and nursing note reviewed.  Constitutional:      Appearance: He is normal weight.  HENT:     Head: Normocephalic and atraumatic.  Cardiovascular:     Rate and Rhythm: Normal rate and regular rhythm.     Heart sounds: Normal heart sounds. No murmur heard.    No friction rub. No gallop.  Pulmonary:     Effort: Pulmonary effort is normal.     Breath sounds: Normal breath sounds.  Abdominal:     General: Abdomen is flat. Bowel sounds are normal.     Palpations: Abdomen is soft.  Musculoskeletal:     Cervical back: Normal range of motion.  Neurological:     General: No focal deficit present.     Mental Status: He is alert and oriented to person, place, and time.  Psychiatric:        Mood and Affect: Mood normal.        Behavior: Behavior normal.    Review of Systems  Constitutional:  Negative for chills, fever and weight loss.  Respiratory:  Negative for cough.   Gastrointestinal:  Negative for diarrhea, nausea and vomiting.  Psychiatric/Behavioral:  Positive for  substance abuse. Negative for hallucinations and suicidal ideas.    Blood pressure 108/64, pulse 68, temperature 98.1 F (36.7 C), temperature source Oral, resp. rate 16, SpO2 99 %. There is no height or weight on file to calculate BMI.  Treatment Plan Summary: Patient denies current suicidal ideations or thoughts of self harm, and is amenable to long-term treatment. We will provide information and contacts for programs, will discharge with recommendation to self-enroll in substance abuse program and to follow up with primary care to continue treatment of preexisting medical conditions.     Driscilla Grammes, Student-PA 11/13/2021 2:54 PM

## 2021-11-13 NOTE — Clinical Social Work Psych Note (Signed)
LCSW Initial Note    LCSW met with [name] for introduction and begin discussions regarding treatment and potential discharge planning.   [name] presented with a dysphoric affect, congruent mood. He denied having any SI, HI or AVH at this time. [name] also declined having any physical complaints or withdrawal symptoms at this time.   [name] reports that he came to the [facility] seeking detox services. [name] shared that he struggles with ongoing substance abuse issues which exacerbates his mental health struggles. [name]  endorsed using three grams of methamphetamines on a daily basis during a two week period. [name] also endorsed occasional ETOH use.   [name] shred that his main goal was to get into a sober living program, specifically [facility]. [name] reports he follows up with [facility] for outpatient medication management.  LCSW provided [name] with appropriate resources for sober living programming. [name] denied having any additional questions or concerns at this time.   LCSW will continue to follow    Radonna Ricker, MSW, LCSW Clinical Social Worker ([place]) [facility]

## 2021-11-13 NOTE — ED Provider Notes (Addendum)
Facility Based Crisis Admission H&P  Date: 11/13/21 Patient Name: [name] MRN: [id number] Chief Complaint:  Chief Complaint  Patient presents with   Addiction Problem      Diagnoses:  Final diagnoses:  Substance abuse [facility]    HPI: [name] is a 41 year old male with psychiatric history of polysubstance abuse, substance-induced mood disorder, bipolar disorder, and suicidal ideation.  Patient presented voluntarily to [facility] due to suicidal ideation with plan to run into traffic and requesting substance abuse treatment.  Patient was evaluated and recommended for admission to [facility] for stabilization.  This provider reviewed patient's chart and met with patient face-to-face upon his arrival to [facility].  On assessment, patient is alert and oriented x4.  Patient is calm and cooperative.  Patient's mood is euthymic with congruent affect.  He is speaking in a normal tone of voice at moderate rate with good eye contact.  Process is coherent and relevant.  Patient acknowledges history of polysubstance abuse.  He says that he has been struggling with substance abuse for approximately 5 years. He reports he mainly abuses methamphetamine and alcohol.  Patient says that he uses approximately 1 g of methamphetamine and drinks 2 cans of beer daily.  He says he would like to seek substance abuse treatment in order to be able to obtain employment and house. He says he wants to go to [facility] for residential rehab. He reports that he last used meth and alcohol on 11/08/21.  Patient denies current suicidal ideation or plans to harm himself.  Patient denies homicidal ideation, hallucination, and paranoia.   PHQ 2-9:   [facility] from 11/13/2021 in [facility] ED from 11/11/2021 in [facility] DEPT ED from 10/08/2021 in [place] DEPT  C-SSRS RISK CATEGORY No Risk High Risk No Risk        Total Time  spent with patient: 15 minutes  Musculoskeletal  Strength & Muscle Tone: within normal limits Gait & Station: normal Patient leans: Right  Psychiatric Specialty Exam  Presentation General Appearance: Appropriate for Environment  Eye Contact:Good  Speech:Clear and Coherent  Speech Volume:Normal  Handedness:Right   Mood and Affect  Mood:Depressed  Affect:Congruent   Thought Process  Thought Processes:Coherent  Descriptions of Associations:Intact  Orientation:Full (Time, Place and Person)  Thought Content:WDL    Hallucinations:Hallucinations: None  Ideas of Reference:None  Suicidal Thoughts:Suicidal Thoughts: No  Homicidal Thoughts:Homicidal Thoughts: No   Sensorium  Memory:Immediate Good; Recent Good; Remote Good  Judgment:Good  Insight:Good   Executive Functions  Concentration:Good  Attention Span:Good  [place] of Knowledge:Good  Language:Good   Psychomotor Activity  Psychomotor Activity:Psychomotor Activity: Normal   Assets  Assets:Desire for Improvement; Physical Health; Communication Skills   Sleep  Sleep:Sleep: Fair Number of Hours of Sleep: 4   Nutritional Assessment (For OBS and FBC admissions only) Has the patient had a weight loss or gain of 10 pounds or more in the last 3 months?: No Has the patient had a decrease in food intake/or appetite?: No Does the patient have dental problems?: No Does the patient have eating habits or behaviors that may be indicators of an eating disorder including binging or inducing vomiting?: No Has the patient recently lost weight without trying?: 0 Has the patient been eating poorly because of a decreased appetite?: 0 Malnutrition Screening Tool Score: 0    Physical Exam Vitals and nursing note reviewed.  Constitutional:      General: He is not  in acute distress.    Appearance: He is well-developed.  HENT:     Head: Normocephalic and atraumatic.  Eyes:      Conjunctiva/sclera: Conjunctivae normal.  Cardiovascular:     Rate and Rhythm: Normal rate.     Heart sounds: No murmur heard. Pulmonary:     Effort: Pulmonary effort is normal. No respiratory distress.     Breath sounds: Normal breath sounds.  Abdominal:     Palpations: Abdomen is soft.     Tenderness: There is no abdominal tenderness.  Musculoskeletal:        General: No swelling.     Cervical back: Neck supple.  Skin:    General: Skin is warm and dry.     Capillary Refill: Capillary refill takes less than 2 seconds.  Neurological:     Mental Status: He is alert and oriented to person, place, and time.  Psychiatric:        Attention and Perception: Attention and perception normal.        Mood and Affect: Mood normal.        Speech: Speech normal.        Behavior: Behavior normal. Behavior is cooperative.        Thought Content: Thought content normal. Thought content is not paranoid. Thought content does not include homicidal or suicidal ideation. Thought content does not include suicidal plan.        Cognition and Memory: Cognition normal.    Review of Systems  Constitutional: Negative.   HENT: Negative.    Eyes: Negative.   Respiratory: Negative.    Cardiovascular: Negative.   Gastrointestinal: Negative.   Genitourinary: Negative.   Musculoskeletal: Negative.   Skin: Negative.   Neurological: Negative.   Endo/Heme/Allergies: Negative.   Psychiatric/Behavioral:  Positive for substance abuse.     Blood pressure 108/64, pulse 68, temperature 98.1 F (36.7 C), temperature source Oral, resp. rate 16, SpO2 99 %. There is no height or weight on file to calculate BMI.  Past Psychiatric History: Bipolar disorder, suicidal ideation, alcohol abuse, methamphetamine abuse,  Is the patient at risk to self? No  Has the patient been a risk to self in the past 6 months? Yes .    Has the patient been a risk to self within the distant past? Yes   Is the patient a risk to others? No    Has the patient been a risk to others in the past 6 months? No   Has the patient been a risk to others within the distant past? No   Past Medical History:  Past Medical History:  Diagnosis Date   Bipolar 1 disorder ([place])    Depression    Heart murmur     Past Surgical History:  Procedure Laterality Date   BACK SURGERY  2011   had melanoma mole removed    Family History:  Family History  Problem Relation Age of Onset   Heart Problems Father     Social History:  Social History   Socioeconomic History   Marital status: Single    Spouse name: Not on file   Number of children: Not on file   Years of education: Not on file   Highest education level: Not on file  Occupational History   Not on file  Tobacco Use   Smoking status: Every Day    Packs/day: 1.00    Years: 5.00    Total pack years: 5.00    Types: Cigarettes    Last  attempt to quit: 06/04/2013    Years since quitting: 8.4   Smokeless tobacco: Never  Substance and Sexual Activity   Alcohol use: Yes    Comment: occasionally sober 7 months   Drug use: Yes    Types: Cocaine    Comment: opiates- currently in treatment at [facility] (11/18/14) clean 7 months   Sexual activity: Not Currently  Other Topics Concern   Not on file  Social History Narrative   Not on file   Social Determinants of Health   Financial Resource Strain: Not on file  Food Insecurity: Not on file  Transportation Needs: Not on file  Physical Activity: Not on file  Stress: Not on file  Social Connections: Not on file  Intimate Partner Violence: Not on file    SDOH:  SDOH Screenings   Alcohol Screen: Not on file  Depression (PHQ2-9): Not on file  Financial Resource Strain: Not on file  Food Insecurity: Not on file  Housing: Not on file  Physical Activity: Not on file  Social Connections: Not on file  Stress: Not on file  Tobacco Use: High Risk (11/11/2021)   Patient History    Smoking Tobacco Use: Every Day    Smokeless Tobacco  Use: Never    Passive Exposure: Not on file  Transportation Needs: Not on file    Last Labs:  Admission on 11/11/2021, Discharged on 11/13/2021  Component Date Value Ref Range Status   Sodium 11/11/2021 142  135 - 145 mmol/L Final   Potassium 11/11/2021 3.6  3.5 - 5.1 mmol/L Final   Chloride 11/11/2021 109  98 - 111 mmol/L Final   CO2 11/11/2021 27  22 - 32 mmol/L Final   Glucose, Bld 11/11/2021 92  70 - 99 mg/dL Final   Glucose reference range applies only to samples taken after fasting for at least 8 hours.   BUN 11/11/2021 12  6 - 20 mg/dL Final   Creatinine, Ser 11/11/2021 1.02  0.61 - 1.24 mg/dL Final   Calcium 11/11/2021 8.9  8.9 - 10.3 mg/dL Final   Total Protein 11/11/2021 6.9  6.5 - 8.1 g/dL Final   Albumin 11/11/2021 3.9  3.5 - 5.0 g/dL Final   AST 11/11/2021 19  15 - 41 U/L Final   ALT 11/11/2021 35  0 - 44 U/L Final   Alkaline Phosphatase 11/11/2021 53  38 - 126 U/L Final   Total Bilirubin 11/11/2021 1.1  0.3 - 1.2 mg/dL Final   GFR, Estimated 11/11/2021 >60  >60 mL/min Final   Comment: (NOTE) Calculated using the CKD-EPI Creatinine Equation (2021)    Anion gap 11/11/2021 6  5 - 15 Final   Performed at [facility], [place] [address] [place], [place] [address]   Alcohol, Ethyl (B) 11/11/2021 <10  <10 mg/dL Final   Comment: (NOTE) Lowest detectable limit for serum alcohol is 10 mg/dL.  For medical purposes only. Performed at [facility], [place] [address], [place], [place] [address]    Salicylate Lvl 78/29/5621 <7.0 (L)  7.0 - 30.0 mg/dL Final   Performed at [place] [address], [place], [place] [address]   Acetaminophen (Tylenol), Serum 11/11/2021 <10 (L)  10 - 30 ug/mL Final   Comment: (NOTE) Therapeutic concentrations vary significantly. A range of 10-30 ug/mL  may be an effective concentration for many patients. However, some  are best treated at concentrations outside of this range. Acetaminophen  concentrations >150 ug/mL at 4 hours after ingestion  and >50 ug/mL at 12 hours after ingestion are often associated with  toxic reactions.  Performed at [facility], [place] [address] [place], [place] [address]    WBC 11/11/2021 5.9  4.0 - 10.5 K/uL Final   RBC 11/11/2021 4.54  4.22 - 5.81 MIL/uL Final   Hemoglobin 11/11/2021 13.8  13.0 - 17.0 g/dL Final   HCT 11/11/2021 43.1  39.0 - 52.0 % Final   MCV 11/11/2021 94.9  80.0 - 100.0 fL Final   MCH 11/11/2021 30.4  26.0 - 34.0 pg Final   MCHC 11/11/2021 32.0  30.0 - 36.0 g/dL Final   RDW 11/11/2021 13.3  11.5 - 15.5 % Final   Platelets 11/11/2021 247  150 - 400 K/uL Final   nRBC 11/11/2021 0.0  0.0 - 0.2 % Final   Performed at [facility], [place] [address], [place], [place] [address]   Opiates 11/11/2021 NONE DETECTED  NONE DETECTED Final   Cocaine 11/11/2021 NONE DETECTED  NONE DETECTED Final   Benzodiazepines 11/11/2021 NONE DETECTED  NONE DETECTED Final   Amphetamines 11/11/2021 POSITIVE (A)  NONE DETECTED Final   Tetrahydrocannabinol 11/11/2021 POSITIVE (A)  NONE DETECTED Final   Barbiturates 11/11/2021 NONE DETECTED  NONE DETECTED Final   Comment: (NOTE) DRUG SCREEN FOR MEDICAL PURPOSES ONLY.  IF CONFIRMATION IS NEEDED FOR ANY PURPOSE, NOTIFY LAB WITHIN 5 DAYS.  LOWEST DETECTABLE LIMITS FOR URINE DRUG SCREEN Drug Class                     Cutoff (ng/mL) Amphetamine and metabolites    1000 Barbiturate and metabolites    200 Benzodiazepine                 500 Tricyclics and metabolites     300 Opiates and metabolites        300 Cocaine and metabolites        300 THC                            50 Performed at [facility], [place] [address], [place], [place] [address]    SARS Coronavirus 2 11/12/2021 NEGATIVE  NEGATIVE Final   Comment: (NOTE) SARS-CoV-2 target nucleic acids are NOT DETECTED.  The SARS-CoV-2 RNA is generally detectable in upper and lower respiratory  specimens during the acute phase of infection. Negative results do not preclude SARS-CoV-2 infection, do not rule out co-infections with other pathogens, and should not be used as the sole basis for treatment or other patient management decisions. Negative results must be combined with clinical observations, patient history, and epidemiological information. The expected result is Negative.  Fact Sheet for Patients: SugarRoll.be  Fact Sheet for Healthcare Providers: https://www.woods-mathews.com/  This test is not yet approved or cleared by the [place] FDA and  has been authorized for detection and/or diagnosis of SARS-CoV-2 by FDA under an Emergency Use Authorization (EUA). This EUA will remain  in effect (meaning this test can be used) for the duration of the COVID-19 declaration under Se                          ction 564(b)(1) of the Act, 21 U.S.C. section 360bbb-3(b)(1), unless the authorization is terminated or revoked sooner.  Performed at [facility], [place] [address], [place], [place] [address]    SARS Coronavirus 2 by RT PCR 11/12/2021 NEGATIVE  NEGATIVE Final   Comment: (NOTE) SARS-CoV-2 target nucleic acids are NOT DETECTED.  The SARS-CoV-2 RNA is generally detectable in upper and lower respiratory specimens during the acute phase of infection. The lowest concentration of SARS-CoV-2 viral copies this assay can detect is 250 copies / mL. A negative result does not preclude SARS-CoV-2 infection and should not be used as the sole basis for treatment or other patient management decisions.  A negative result may occur with improper specimen collection / handling, submission of specimen other than nasopharyngeal swab, presence of viral mutation(s) within the areas targeted by this assay, and inadequate number of viral copies (<250 copies / mL). A negative result must be combined with clinical observations, patient  history, and epidemiological information.  Fact Sheet for Patients:   https://www.patel.info/  Fact Sheet for Healthcare Providers: https://hall.com/  This test is not yet approved or                           cleared by the [place] FDA and has been authorized for detection and/or diagnosis of SARS-CoV-2 by FDA under an Emergency Use Authorization (EUA).  This EUA will remain in effect (meaning this test can be used) for the duration of the COVID-19 declaration under Section 564(b)(1) of the Act, 21 U.S.C. section 360bbb-3(b)(1), unless the authorization is terminated or revoked sooner.  Performed at [facility], [place] [address] [place], [place] [address]   Admission on 10/08/2021, Discharged on 10/11/2021  Component Date Value Ref Range Status   Sodium 10/08/2021 136  135 - 145 mmol/L Final   Potassium 10/08/2021 3.7  3.5 - 5.1 mmol/L Final   Chloride 10/08/2021 102  98 - 111 mmol/L Final   CO2 10/08/2021 23  22 - 32 mmol/L Final   Glucose, Bld 10/08/2021 101 (H)  70 - 99 mg/dL Final   Glucose reference range applies only to samples taken after fasting for at least 8 hours.   BUN 10/08/2021 19  6 - 20 mg/dL Final   Creatinine, Ser 10/08/2021 1.08  0.61 - 1.24 mg/dL Final   Calcium 10/08/2021 9.0  8.9 - 10.3 mg/dL Final   Total Protein 10/08/2021 7.8  6.5 - 8.1 g/dL Final   Albumin 10/08/2021 4.6  3.5 - 5.0 g/dL Final   AST 10/08/2021 44 (H)  15 - 41 U/L Final   ALT 10/08/2021 52 (H)  0 - 44 U/L Final   Alkaline Phosphatase 10/08/2021 59  38 - 126 U/L Final   Total Bilirubin 10/08/2021 2.0 (H)  0.3 - 1.2 mg/dL Final   GFR, Estimated 10/08/2021 >60  >60 mL/min Final   Comment: (NOTE) Calculated using the CKD-EPI Creatinine Equation (2021)    Anion gap 10/08/2021 11  5 - 15 Final   Performed at [facility], [place] [address], [place], [place] [address]   Salicylate Lvl 09/81/1914 <7.0 (L)   7.0 - 30.0 mg/dL Final   Performed at [place] [address], [place], [place] [address]   Acetaminophen (Tylenol), Serum 10/08/2021 <10 (L)  10 - 30 ug/mL Final   Comment: (NOTE) Therapeutic concentrations vary significantly. A range of 10-30 ug/mL  may be an effective concentration for many patients. However, some  are best treated at concentrations outside of this range. Acetaminophen concentrations >150 ug/mL at 4 hours after ingestion  and >50 ug/mL at 12 hours after ingestion are often associated with  toxic reactions.  Performed at  [facility], [place] [address] [place], [place] [address]    Alcohol, Ethyl (B) 10/08/2021 <10  <10 mg/dL Final   Comment: (NOTE) Lowest detectable limit for serum alcohol is 10 mg/dL.  For medical purposes only. Performed at [facility], [place] [address], [place], [place] [address]    Opiates 10/08/2021 NONE DETECTED  NONE DETECTED Final   Cocaine 10/08/2021 NONE DETECTED  NONE DETECTED Final   Benzodiazepines 10/08/2021 POSITIVE (A)  NONE DETECTED Final   Amphetamines 10/08/2021 POSITIVE (A)  NONE DETECTED Final   Tetrahydrocannabinol 10/08/2021 NONE DETECTED  NONE DETECTED Final   Barbiturates 10/08/2021 NONE DETECTED  NONE DETECTED Final   Comment: (NOTE) DRUG SCREEN FOR MEDICAL PURPOSES ONLY.  IF CONFIRMATION IS NEEDED FOR ANY PURPOSE, NOTIFY LAB WITHIN 5 DAYS.  LOWEST DETECTABLE LIMITS FOR URINE DRUG SCREEN Drug Class                     Cutoff (ng/mL) Amphetamine and metabolites    1000 Barbiturate and metabolites    200 Benzodiazepine                 836 Tricyclics and metabolites     300 Opiates and metabolites        300 Cocaine and metabolites        300 THC                            50 Performed at [facility], [place] [address], [place], [place] [address]    WBC 10/08/2021 12.6 (H)  4.0 - 10.5 K/uL Final   RBC 10/08/2021 4.65  4.22 - 5.81  MIL/uL Final   Hemoglobin 10/08/2021 14.5  13.0 - 17.0 g/dL Final   HCT 10/08/2021 42.1  39.0 - 52.0 % Final   MCV 10/08/2021 90.5  80.0 - 100.0 fL Final   MCH 10/08/2021 31.2  26.0 - 34.0 pg Final   MCHC 10/08/2021 34.4  30.0 - 36.0 g/dL Final   RDW 10/08/2021 12.9  11.5 - 15.5 % Final   Platelets 10/08/2021 247  150 - 400 K/uL Final   nRBC 10/08/2021 0.0  0.0 - 0.2 % Final   Neutrophils Relative % 10/08/2021 68  % Final   Neutro Abs 10/08/2021 8.6 (H)  1.7 - 7.7 K/uL Final   Lymphocytes Relative 10/08/2021 17  % Final   Lymphs Abs 10/08/2021 2.1  0.7 - 4.0 K/uL Final   Monocytes Relative 10/08/2021 14  % Final   Monocytes Absolute 10/08/2021 1.8 (H)  0.1 - 1.0 K/uL Final   Eosinophils Relative 10/08/2021 1  % Final   Eosinophils Absolute 10/08/2021 0.1  0.0 - 0.5 K/uL Final   Basophils Relative 10/08/2021 0  % Final   Basophils Absolute 10/08/2021 0.0  0.0 - 0.1 K/uL Final   Immature Granulocytes 10/08/2021 0  % Final   Abs Immature Granulocytes 10/08/2021 0.03  0.00 - 0.07 K/uL Final   Performed at [facility], [place] [address], [place], [place] [address]    Allergies: Patient has no known allergies.  PTA Medications: (Not in a hospital admission)   Long Term Goals: Improvement in symptoms so as ready for discharge  Short Term Goals: Patient will verbalize feelings in meetings with treatment team members., Patient will attend at least of 50% of the groups daily., Pt will complete the PHQ9 on admission, day 3 and discharge., Patient will  participate in completing the [place] q shift., Patient will remain free of seclusion/restraint/assault incidents 24 hours prior to discharge., and Patient will take medications as prescribed daily.  Medical Decision Making  Patient will be admitted to [facility] for substance abuse treatment and stabilization. -reviewed available lab results -resume home medication -monitor patient for safety     Recommendations  Based on my evaluation the patient does not appear to have an emergency medical condition.  Ophelia Shoulder, NP 11/13/21  6:15 AM

## 2021-11-13 NOTE — Progress Notes (Addendum)
Received [name] this AM asleep in his room, he remained isolative to his room throughout the day except to make a couple of phone calls. His affect is flat.

## 2021-11-14 MED ORDER — BENZTROPINE MESYLATE 0.5 MG PO TABS: 0.5000 mg | ORAL_TABLET | Freq: Every day | ORAL | 0 refills | Status: DC

## 2021-11-14 MED ORDER — DIVALPROEX SODIUM 500 MG PO DR TAB: 500.0000 mg | DELAYED_RELEASE_TABLET | Freq: Two times a day (BID) | ORAL | 0 refills | Status: DC

## 2021-11-14 MED ORDER — BUSPIRONE HCL 7.5 MG PO TABS: 7.5000 mg | ORAL_TABLET | Freq: Two times a day (BID) | ORAL | 0 refills | Status: DC

## 2021-11-14 MED ORDER — OLANZAPINE 10 MG PO TABS: 10.0000 mg | ORAL_TABLET | Freq: Every day | ORAL | 0 refills | Status: DC

## 2021-11-14 MED ORDER — FLUOXETINE HCL 20 MG PO CAPS: 20.0000 mg | ORAL_CAPSULE | Freq: Every morning | ORAL | 0 refills | Status: DC

## 2021-11-14 NOTE — ED Notes (Signed)
Pt refused to eat breakfast and Lunch. He only asked for orange juice.

## 2021-11-14 NOTE — ED Provider Notes (Signed)
FBC/OBS ASAP Discharge Summary  Date and Time: 11/14/2021 1:01 PM  Name: [name]  MRN:  [id number]   Discharge Diagnoses:  Final diagnoses:  Substance abuse ([place])  Bipolar affective disorder, current episode hypomanic ([place])    Subjective:  Patient seen and chart reviewed-patient has been medication compliant and appropriate with staff and peers on the unit.  Patient describes his mood as "pretty good".  He denies SI/HI/AVH.  He rates his mood as 6 or 7/10 (10 being the best).  Patient states that he has not been able to talk to anyone from [place] or [place] houses.  Patient states he will attempt to call them today.  Patient requested discharge shortly after assessment-he reported that his car was in the [place] parking lot and that he would like to discharge at this time.  Patient provided with 7-day samples of medications as well as resources including sober living facilities which were provided by social worker prior to discharge.   Stay Summary:  41 year old male with history of bipolar disorder, substance-induced psychotic disorder, substance abuse who resented on 11/11/2021 to behavioral health Hospital requesting inpatient admission admission related to meth use-he was discharged with outpatient resources and he presented to the [name] ED reporting SI.  Patient was accepted to the [facility] on 11/13/2021 for continued treatment and crisis stabilization.  UDS + amphetamines, THC; EtOH negative. The following day, 6/13, patient requested discharge and denied all complaints.   On my interview, patient is in NAD, alert, oriented, calm, cooperative, and attentive, with normal affect, speech, and behavior. Objectively, there is no evidence of psychosis/ mania (able to converse coherently, linear and goal directed thought, no RIS, no distractibility, not pre-occupied, no FOI, etc) nor depression to the point of suicidality (able to concentrate, affect full and reactive, speech  normal r/v/t, no psychomotor retardation/agitation, etc). Patient denied SI/HI/AVH and requested discharge. Patient does not meet criteria for IVC and will be discharged per request.  Overall, patient appears to be at the point, in the absence of inhibiting or disinhibiting symptoms, where he can successfully move to lesser restrictive setting for care.   Total Time spent with patient: 20 minutes  Past Psychiatric History:  bipolar disorder, substance-induced psychotic disorder, substance abuse Past Medical History:  Past Medical History:  Diagnosis Date   Bipolar 1 disorder ([place])    Depression    Heart murmur    Substance abuse ([place])     Past Surgical History:  Procedure Laterality Date   BACK SURGERY  2011   had melanoma mole removed   Family History:  Family History  Problem Relation Age of Onset   Heart Problems Father    Family Psychiatric History:  Denies psychiatric history Denies substance use history Denies history of attempted or completed suicides   Social History:  Social History   Substance and Sexual Activity  Alcohol Use Yes   Comment: 2 cans of beer most days     Social History   Substance and Sexual Activity  Drug Use Yes   Types: Cocaine, Amphetamines   Comment: snort/smoke 1 gm methamphetamine daily, "on-and-off" for 4 years.    Social History   Socioeconomic History   Marital status: Single    Spouse name: Not on file   Number of children: Not on file   Years of education: Not on file   Highest education level: Not on file  Occupational History   Not on file  Tobacco Use   Smoking status:  Former    Packs/day: 1.00    Years: 5.00    Total pack years: 5.00    Types: Cigarettes    Quit date: 06/04/2013    Years since quitting: 8.4   Smokeless tobacco: Never  Substance and Sexual Activity   Alcohol use: Yes    Comment: 2 cans of beer most days   Drug use: Yes    Types: Cocaine, Amphetamines    Comment: snort/smoke 1 gm  methamphetamine daily, "on-and-off" for 4 years.   Sexual activity: Not Currently  Other Topics Concern   Not on file  Social History Narrative   Not on file   Social Determinants of Health   Financial Resource Strain: Not on file  Food Insecurity: Not on file  Transportation Needs: Not on file  Physical Activity: Not on file  Stress: Not on file  Social Connections: Not on file   SDOH:  SDOH Screenings   Alcohol Screen: Not on file  Depression (PHQ2-9): Low Risk  (11/13/2021)   Depression (PHQ2-9)    PHQ-2 Score: 1  Financial Resource Strain: Not on file  Food Insecurity: Not on file  Housing: Not on file  Physical Activity: Not on file  Social Connections: Not on file  Stress: Not on file  Tobacco Use: Medium Risk (11/13/2021)   Patient History    Smoking Tobacco Use: Former    Smokeless Tobacco Use: Never    Passive Exposure: Not on file  Transportation Needs: Not on file    Tobacco Cessation:  N/A, patient does not currently use tobacco products  Current Medications:  Current Facility-Administered Medications  Medication Dose Route Frequency Provider Last Rate Last Admin   acetaminophen (TYLENOL) tablet 650 mg  650 mg Oral Q6H PRN Ajibola, Ene A, NP       alum & mag hydroxide-simeth (MAALOX/MYLANTA) 200-200-20 MG/5ML suspension 30 mL  30 mL Oral Q4H PRN Ajibola, Ene A, NP       benztropine (COGENTIN) tablet 0.5 mg  0.5 mg Oral QHS Ajibola, Ene A, NP   0.5 mg at 11/13/21 2105   busPIRone (BUSPAR) tablet 7.5 mg  7.5 mg Oral BID Ajibola, Ene A, NP   7.5 mg at 11/14/21 1006   divalproex (DEPAKOTE) DR tablet 500 mg  500 mg Oral BID Ajibola, Ene A, NP   500 mg at 11/14/21 1006   FLUoxetine (PROZAC) capsule 20 mg  20 mg Oral Daily Ajibola, Ene A, NP   20 mg at 11/14/21 1006   hydrOXYzine (ATARAX) tablet 25 mg  25 mg Oral TID PRN Ajibola, Ene A, NP       magnesium hydroxide (MILK OF MAGNESIA) suspension 30 mL  30 mL Oral Daily PRN Ajibola, Ene A, NP       OLANZapine  (ZYPREXA) tablet 10 mg  10 mg Oral QHS Ajibola, Ene A, NP   10 mg at 11/13/21 2105   traZODone (DESYREL) tablet 50 mg  50 mg Oral QHS PRN Ajibola, Ene A, NP       Current Outpatient Medications  Medication Sig Dispense Refill   benztropine (COGENTIN) 0.5 MG tablet Take 1 tablet (0.5 mg total) by mouth at bedtime. 30 tablet 0   busPIRone (BUSPAR) 7.5 MG tablet Take 1 tablet (7.5 mg total) by mouth 2 (two) times daily. 30 tablet 0   divalproex (DEPAKOTE) 500 MG DR tablet Take 1 tablet (500 mg total) by mouth 2 (two) times daily. 60 tablet 0   FLUoxetine (PROZAC) 20 MG capsule Take  1 capsule (20 mg total) by mouth every morning. 30 capsule 0   OLANZapine (ZYPREXA) 10 MG tablet Take 1 tablet (10 mg total) by mouth at bedtime. 30 tablet 0   sildenafil (VIAGRA) 50 MG tablet Take 50 mg by mouth daily as needed for erectile dysfunction.      PTA Medications: (Not in a hospital admission)      11/14/2021    1:01 PM 11/13/2021    2:27 PM  Depression screen PHQ 2/9  Decreased Interest 2 0  Down, Depressed, Hopeless 2 1  PHQ - 2 Score 4 1  Altered sleeping 0   Tired, decreased energy 0   Change in appetite 1   Feeling bad or failure about yourself  0   Trouble concentrating 0   Moving slowly or fidgety/restless 0   Suicidal thoughts 0   PHQ-9 Score 5   Difficult doing work/chores Somewhat difficult     Flowsheet Row ED from 11/13/2021 in [facility] from 11/11/2021 in [facility] from 10/08/2021 in [facility]  C-SSRS RISK CATEGORY No Risk High Risk No Risk       Musculoskeletal  Strength & Muscle Tone: within normal limits Gait & Station: normal Patient leans: N/A  Psychiatric Specialty Exam  Presentation  General Appearance: Appropriate for Environment; Casual  Eye Contact:Good  Speech:Clear and Coherent; Normal Rate  Speech Volume:Normal  Handedness:Right   Mood and  Affect  Mood:-- ("pretty good")  Affect:Appropriate; Congruent   Thought Process  Thought Processes:Coherent; Goal Directed; Linear  Descriptions of Associations:Intact  Orientation:Full (Time, Place and Person)  Thought Content:WDL; Logical     Hallucinations:Hallucinations: None  Ideas of Reference:None  Suicidal Thoughts:Suicidal Thoughts: No  Homicidal Thoughts:Homicidal Thoughts: No   Sensorium  Memory:Immediate Good; Recent Good; Remote Good  Judgment:Good  Insight:Good   Executive Functions  Concentration:Good  Attention Span:Good  Recall:Good  Fund of Knowledge:Good  Language:Good   Psychomotor Activity  Psychomotor Activity:Psychomotor Activity: Normal   Assets  Assets:Communication Skills; Desire for Improvement; Resilience; Physical Health   Sleep  Sleep:Sleep: Good Number of Hours of Sleep: 4   Nutritional Assessment (For OBS and FBC admissions only) Has the patient had a weight loss or gain of 10 pounds or more in the last 3 months?: No Has the patient had a decrease in food intake/or appetite?: No Does the patient have dental problems?: No Does the patient have eating habits or behaviors that may be indicators of an eating disorder including binging or inducing vomiting?: No Has the patient recently lost weight without trying?: 0 Has the patient been eating poorly because of a decreased appetite?: 0 Malnutrition Screening Tool Score: 0    Physical Exam  Physical Exam Constitutional:      Appearance: Normal appearance. He is normal weight.  HENT:     Head: Normocephalic and atraumatic.  Eyes:     Extraocular Movements: Extraocular movements intact.  Pulmonary:     Effort: Pulmonary effort is normal.  Neurological:     General: No focal deficit present.     Mental Status: He is alert and oriented to person, place, and time.  Psychiatric:        Attention and Perception: Attention and perception normal.        Speech:  Speech normal.        Behavior: Behavior normal. Behavior is cooperative.        Thought Content: Thought content normal.  Review of Systems  Constitutional:  Negative for chills and fever.  HENT:  Negative for hearing loss.   Eyes:  Negative for discharge and redness.  Respiratory:  Negative for cough.   Cardiovascular:  Negative for chest pain.  Gastrointestinal:  Negative for abdominal pain.  Musculoskeletal:  Negative for myalgias.  Neurological:  Negative for headaches.   Blood pressure 109/63, pulse (!) 51, temperature 97.8 F (36.6 C), temperature source Oral, resp. rate 18, SpO2 98 %. There is no height or weight on file to calculate BMI.  Demographic Factors:  Male, Caucasian, Low socioeconomic status, Living alone, and Unemployed  Loss Factors: Financial problems/change in socioeconomic status  Historical Factors: Prior suicide attempts, Impulsivity, and substance use  Risk Reduction Factors:   Positive coping skills or problem solving skills and future oriented, hope for the future,   Continued Clinical Symptoms:  Bipolar Disorder:   Depressive phase Alcohol/Substance Abuse/Dependencies Previous Psychiatric Diagnoses and Treatments  Cognitive Features That Contribute To Risk:  None    Suicide Risk:  Mild:  Suicidal ideation of limited frequency, intensity, duration, and specificity.  There are no identifiable plans, no associated intent, mild dysphoria and related symptoms, good self-control (both objective and subjective assessment), few other risk factors, and identifiable protective factors, including available and accessible social support.  Plan Of Care/Follow-up recommendations:  Activity:  as tolerated Diet:  regular Other:    Take all medications as prescribed by his/her mental healthcare provider. Report any adverse effects and or reactions from the medicines to your outpatient provider promptly. Do not engage in alcohol and or illegal drug use  while on prescription medicines. In the event of worsening symptoms, call the crisis hotline, 911 and or go to the nearest ED for appropriate evaluation and treatment of symptoms. follow-up with your primary care provider for your other medical issues, concerns and or health care needs.   Allergies as of 11/14/2021   No Known Allergies      Medication List     STOP taking these medications    LORazepam 0.5 MG tablet Commonly known as: ATIVAN       TAKE these medications    benztropine 0.5 MG tablet Commonly known as: COGENTIN Take 1 tablet (0.5 mg total) by mouth at bedtime.   busPIRone 7.5 MG tablet Commonly known as: BUSPAR Take 1 tablet (7.5 mg total) by mouth 2 (two) times daily.   divalproex 500 MG DR tablet Commonly known as: DEPAKOTE Take 1 tablet (500 mg total) by mouth 2 (two) times daily.   FLUoxetine 20 MG capsule Commonly known as: PROZAC Take 1 capsule (20 mg total) by mouth every morning.   OLANZapine 10 MG tablet Commonly known as: ZYPREXA Take 1 tablet (10 mg total) by mouth at bedtime.   sildenafil 50 MG tablet Commonly known as: VIAGRA Take 50 mg by mouth daily as needed for erectile dysfunction.        Patient was provided with 7 day samples of cogentin, buspar, depakote, prozac, zyprexa Patient was provided with follow up information regarding psychiatric outpatient resources in [place] with the assistance of SW prior to discharge. Encouraged patient to follow up at [facility] when he has established outpatient services.     Disposition: self care  Ival Bible, MD 11/14/2021, 1:02 PM

## 2021-11-14 NOTE — Progress Notes (Cosign Needed)
Behavioral Health Progress Note  Date and Time: 11/14/2021 10:09 AM Name: [name] MRN:  [id number]  Subjective:   [name] is a 41 year old male with psychiatric history of polysubstance abuse, substance-induced mood disorder, bipolar disorder, and suicidal ideation. Patient presented voluntarily to [facility] due to suicidal ideation with plan to run into traffic and requesting substance abuse treatment.  On evaluation today, [name] was found lying in bed in his room with the light off and the door closed. He had good eye contact, normal tone and answered all questions, and had a normal affect. He said that he was "alright" this morning, that he slept well but was still tired this morning, and had a normal appetite but had not eaten. He denied current suicidal ideations, thoughts of self-harm, or thoughts of hurting others, denied access to firearms, denied current auditory or visual hallucinations. He denied adverse medication reactions. He rated his happiness at a 6-7/10 with 10 being the highest. He was provided with contact information for sober living programs yesterday after our conversation. His first choice for program is [facility], he called yesterday but the person he needed to speak to was not there and was expected back at 0900 today. Hurley's second choice for program is [facility].  Diagnosis:  Final diagnoses:  Substance abuse (HCC)  Bipolar affective disorder, current episode hypomanic (HCC)    Total Time spent with patient: 20 minutes  Past Psychiatric History:  polysubstance abuse, substance-induced mood disorder, bipolar disorder, and suicidal ideation Past Medical History:  Past Medical History:  Diagnosis Date   Bipolar 1 disorder (HCC)    Depression    Heart murmur    Substance abuse (HCC)     Past Surgical History:  Procedure Laterality Date   BACK SURGERY  2011   had melanoma mole removed   Family History:  Family History  Problem Relation  Age of Onset   Heart Problems Father    Family Psychiatric  History: Denied Social History:  Social History   Substance and Sexual Activity  Alcohol Use Yes   Comment: 2 cans of beer most days     Social History   Substance and Sexual Activity  Drug Use Yes   Types: Cocaine, Amphetamines   Comment: snort/smoke 1 gm methamphetamine daily, "on-and-off" for 4 years.    Social History   Socioeconomic History   Marital status: Single    Spouse name: Not on file   Number of children: Not on file   Years of education: Not on file   Highest education level: Not on file  Occupational History   Not on file  Tobacco Use   Smoking status: Former    Packs/day: 1.00    Years: 5.00    Total pack years: 5.00    Types: Cigarettes    Quit date: 06/04/2013    Years since quitting: 8.4   Smokeless tobacco: Never  Substance and Sexual Activity   Alcohol use: Yes    Comment: 2 cans of beer most days   Drug use: Yes    Types: Cocaine, Amphetamines    Comment: snort/smoke 1 gm methamphetamine daily, "on-and-off" for 4 years.   Sexual activity: Not Currently  Other Topics Concern   Not on file  Social History Narrative   Not on file   Social Determinants of Health   Financial Resource Strain: Not on file  Food Insecurity: Not on file  Transportation Needs: Not on file  Physical Activity: Not on file  Stress: Not on file  Social Connections: Not on file   SDOH:  SDOH Screenings   Alcohol Screen: Not on file  Depression (PHQ2-9): Low Risk  (11/13/2021)   Depression (PHQ2-9)    PHQ-2 Score: 1  Financial Resource Strain: Not on file  Food Insecurity: Not on file  Housing: Not on file  Physical Activity: Not on file  Social Connections: Not on file  Stress: Not on file  Tobacco Use: Medium Risk (11/13/2021)   Patient History    Smoking Tobacco Use: Former    Smokeless Tobacco Use: Never    Passive Exposure: Not on file  Transportation Needs: Not on file   Additional Social  History:                         Sleep: Good  Appetite:  Fair  Current Medications:  Current Facility-Administered Medications  Medication Dose Route Frequency Provider Last Rate Last Admin   acetaminophen (TYLENOL) tablet 650 mg  650 mg Oral Q6H PRN Ajibola, Ene A, NP       alum & mag hydroxide-simeth (MAALOX/MYLANTA) 200-200-20 MG/5ML suspension 30 mL  30 mL Oral Q4H PRN Ajibola, Ene A, NP       benztropine (COGENTIN) tablet 0.5 mg  0.5 mg Oral QHS Ajibola, Ene A, NP   0.5 mg at 11/13/21 2105   busPIRone (BUSPAR) tablet 7.5 mg  7.5 mg Oral BID Ajibola, Ene A, NP   7.5 mg at 11/14/21 1006   divalproex (DEPAKOTE) DR tablet 500 mg  500 mg Oral BID Ajibola, Ene A, NP   500 mg at 11/14/21 1006   FLUoxetine (PROZAC) capsule 20 mg  20 mg Oral Daily Ajibola, Ene A, NP   20 mg at 11/14/21 1006   hydrOXYzine (ATARAX) tablet 25 mg  25 mg Oral TID PRN Ajibola, Ene A, NP       magnesium hydroxide (MILK OF MAGNESIA) suspension 30 mL  30 mL Oral Daily PRN Ajibola, Ene A, NP       OLANZapine (ZYPREXA) tablet 10 mg  10 mg Oral QHS Ajibola, Ene A, NP   10 mg at 11/13/21 2105   traZODone (DESYREL) tablet 50 mg  50 mg Oral QHS PRN Ajibola, Ene A, NP       Current Outpatient Medications  Medication Sig Dispense Refill   benztropine (COGENTIN) 0.5 MG tablet Take 0.5 mg by mouth at bedtime.     busPIRone (BUSPAR) 7.5 MG tablet Take 7.5 mg by mouth 2 (two) times daily.     divalproex (DEPAKOTE) 500 MG DR tablet Take 500 mg by mouth 2 (two) times daily.     FLUoxetine (PROZAC) 20 MG capsule Take 20 mg by mouth every morning.     LORazepam (ATIVAN) 0.5 MG tablet Take 1 tablet (0.5 mg total) by mouth every 8 (eight) hours as needed for up to 6 doses for anxiety. 6 tablet 0   OLANZapine (ZYPREXA) 10 MG tablet Take 10 mg by mouth at bedtime.     sildenafil (VIAGRA) 50 MG tablet Take 50 mg by mouth daily as needed for erectile dysfunction.      Labs  Lab Results:  Admission on 11/13/2021  Component  Date Value Ref Range Status   Valproic Acid Lvl 11/13/2021 58  50.0 - 100.0 ug/mL Final   Performed at [facility], [address], [place], [place] [address]  Admission on 11/11/2021, Discharged on 11/13/2021  Component Date Value Ref  Range Status   Sodium 11/11/2021 142  135 - 145 mmol/L Final   Potassium 11/11/2021 3.6  3.5 - 5.1 mmol/L Final   Chloride 11/11/2021 109  98 - 111 mmol/L Final   CO2 11/11/2021 27  22 - 32 mmol/L Final   Glucose, Bld 11/11/2021 92  70 - 99 mg/dL Final   Glucose reference range applies only to samples taken after fasting for at least 8 hours.   BUN 11/11/2021 12  6 - 20 mg/dL Final   Creatinine, Ser 11/11/2021 1.02  0.61 - 1.24 mg/dL Final   Calcium 16/03/9603 8.9  8.9 - 10.3 mg/dL Final   Total Protein 54/02/8118 6.9  6.5 - 8.1 g/dL Final   Albumin 14/78/2956 3.9  3.5 - 5.0 g/dL Final   AST 21/30/8657 19  15 - 41 U/L Final   ALT 11/11/2021 35  0 - 44 U/L Final   Alkaline Phosphatase 11/11/2021 53  38 - 126 U/L Final   Total Bilirubin 11/11/2021 1.1  0.3 - 1.2 mg/dL Final   GFR, Estimated 11/11/2021 >60  >60 mL/min Final   Comment: (NOTE) Calculated using the CKD-EPI Creatinine Equation (2021)    Anion gap 11/11/2021 6  5 - 15 Final   Performed at [facility], [address], [place], [place] [address]   Alcohol, Ethyl (B) 11/11/2021 <10  <10 mg/dL Final   Comment: (NOTE) Lowest detectable limit for serum alcohol is 10 mg/dL.  For medical purposes only. Performed at [facility], [address], [place], [place] [address]    Salicylate Lvl 11/11/2021 <7.0 (L)  7.0 - 30.0 mg/dL Final   Performed at [facility], [address], [place], [place] [address]   Acetaminophen (Tylenol), Serum 11/11/2021 <10 (L)  10 - 30 ug/mL Final   Comment: (NOTE) Therapeutic concentrations vary significantly. A range of 10-30 ug/mL  may be an effective concentration for many patients. However, some   are best treated at concentrations outside of this range. Acetaminophen concentrations >150 ug/mL at 4 hours after ingestion  and >50 ug/mL at 12 hours after ingestion are often associated with  toxic reactions.  Performed at [facility], [address] [place], [place] [address]    WBC 11/11/2021 5.9  4.0 - 10.5 K/uL Final   RBC 11/11/2021 4.54  4.22 - 5.81 MIL/uL Final   Hemoglobin 11/11/2021 13.8  13.0 - 17.0 g/dL Final   HCT 72/53/6644 43.1  39.0 - 52.0 % Final   MCV 11/11/2021 94.9  80.0 - 100.0 fL Final   MCH 11/11/2021 30.4  26.0 - 34.0 pg Final   MCHC 11/11/2021 32.0  30.0 - 36.0 g/dL Final   RDW 03/47/4259 13.3  11.5 - 15.5 % Final   Platelets 11/11/2021 247  150 - 400 K/uL Final   nRBC 11/11/2021 0.0  0.0 - 0.2 % Final   Performed at [facility], [address] [place], [place] [address]   Opiates 11/11/2021 NONE DETECTED  NONE DETECTED Final   Cocaine 11/11/2021 NONE DETECTED  NONE DETECTED Final   Benzodiazepines 11/11/2021 NONE DETECTED  NONE DETECTED Final   Amphetamines 11/11/2021 POSITIVE (A)  NONE DETECTED Final   Tetrahydrocannabinol 11/11/2021 POSITIVE (A)  NONE DETECTED Final   Barbiturates 11/11/2021 NONE DETECTED  NONE DETECTED Final   Comment: (NOTE) DRUG SCREEN FOR MEDICAL PURPOSES ONLY.  IF CONFIRMATION IS NEEDED FOR ANY PURPOSE, NOTIFY LAB WITHIN 5 DAYS.  LOWEST DETECTABLE LIMITS FOR URINE  DRUG SCREEN Drug Class                     Cutoff (ng/mL) Amphetamine and metabolites    1000 Barbiturate and metabolites    200 Benzodiazepine                 200 Tricyclics and metabolites     300 Opiates and metabolites        300 Cocaine and metabolites        300 THC                            50 Performed at [facility], [address] [place], [place] [address]    SARS Coronavirus 2 11/12/2021 NEGATIVE  NEGATIVE Final   Comment: (NOTE) SARS-CoV-2 target nucleic acids are NOT  DETECTED.  The SARS-CoV-2 RNA is generally detectable in upper and lower respiratory specimens during the acute phase of infection. Negative results do not preclude SARS-CoV-2 infection, do not rule out co-infections with other pathogens, and should not be used as the sole basis for treatment or other patient management decisions. Negative results must be combined with clinical observations, patient history, and epidemiological information. The expected result is Negative.  Fact Sheet for Patients: HairSlick.no  Fact Sheet for Healthcare Providers: quierodirigir.com  This test is not yet approved or cleared by the [place] FDA and  has been authorized for detection and/or diagnosis of SARS-CoV-2 by FDA under an Emergency Use Authorization (EUA). This EUA will remain  in effect (meaning this test can be used) for the duration of the COVID-19 declaration under Se                          ction 564(b)(1) of the Act, 21 U.S.C. section 360bbb-3(b)(1), unless the authorization is terminated or revoked sooner.  Performed at [facility], [address] [place], [place] [address]    SARS Coronavirus 2 by RT PCR 11/12/2021 NEGATIVE  NEGATIVE Final   Comment: (NOTE) SARS-CoV-2 target nucleic acids are NOT DETECTED.  The SARS-CoV-2 RNA is generally detectable in upper and lower respiratory specimens during the acute phase of infection. The lowest concentration of SARS-CoV-2 viral copies this assay can detect is 250 copies / mL. A negative result does not preclude SARS-CoV-2 infection and should not be used as the sole basis for treatment or other patient management decisions.  A negative result may occur with improper specimen collection / handling, submission of specimen other than nasopharyngeal swab, presence of viral mutation(s) within the areas targeted by this assay, and inadequate number of viral copies (<250  copies / mL). A negative result must be combined with clinical observations, patient history, and epidemiological information.  Fact Sheet for Patients:   RoadLapTop.co.za  Fact Sheet for Healthcare Providers: http://kim-miller.com/  This test is not yet approved or                           cleared by the [place] FDA and has been authorized for detection and/or diagnosis of SARS-CoV-2 by FDA under an Emergency Use Authorization (EUA).  This EUA will remain in effect (meaning this test can be used) for the duration of the COVID-19 declaration under Section 564(b)(1) of the Act, 21 U.S.C. section 360bbb-3(b)(1), unless the authorization is terminated or revoked sooner.  Performed at [facility], [address] [place], [place] [address]   Admission on 10/08/2021, Discharged on 10/11/2021  Component Date Value Ref Range Status   Sodium 10/08/2021 136  135 - 145 mmol/L Final   Potassium 10/08/2021 3.7  3.5 - 5.1 mmol/L Final   Chloride 10/08/2021 102  98 - 111 mmol/L Final   CO2 10/08/2021 23  22 - 32 mmol/L Final   Glucose, Bld 10/08/2021 101 (H)  70 - 99 mg/dL Final   Glucose reference range applies only to samples taken after fasting for at least 8 hours.   BUN 10/08/2021 19  6 - 20 mg/dL Final   Creatinine, Ser 10/08/2021 1.08  0.61 - 1.24 mg/dL Final   Calcium 60/45/409805/12/2021 9.0  8.9 - 10.3 mg/dL Final   Total Protein 11/91/478205/12/2021 7.8  6.5 - 8.1 g/dL Final   Albumin 95/62/130805/12/2021 4.6  3.5 - 5.0 g/dL Final   AST 65/78/469605/12/2021 44 (H)  15 - 41 U/L Final   ALT 10/08/2021 52 (H)  0 - 44 U/L Final   Alkaline Phosphatase 10/08/2021 59  38 - 126 U/L Final   Total Bilirubin 10/08/2021 2.0 (H)  0.3 - 1.2 mg/dL Final   GFR, Estimated 10/08/2021 >60  >60 mL/min Final   Comment: (NOTE) Calculated using the CKD-EPI Creatinine Equation (2021)    Anion gap 10/08/2021 11  5 - 15 Final   Performed at [facility],  [address] [place], [place] [address]   Salicylate Lvl 10/08/2021 <7.0 (L)  7.0 - 30.0 mg/dL Final   Performed at [facility], [address] [place], [place] [address]   Acetaminophen (Tylenol), Serum 10/08/2021 <10 (L)  10 - 30 ug/mL Final   Comment: (NOTE) Therapeutic concentrations vary significantly. A range of 10-30 ug/mL  may be an effective concentration for many patients. However, some  are best treated at concentrations outside of this range. Acetaminophen concentrations >150 ug/mL at 4 hours after ingestion  and >50 ug/mL at 12 hours after ingestion are often associated with  toxic reactions.  Performed at [facility], [address] [place], [place] [address]    Alcohol, Ethyl (B) 10/08/2021 <10  <10 mg/dL Final   Comment: (NOTE) Lowest detectable limit for serum alcohol is 10 mg/dL.  For medical purposes only. Performed at [facility], [address] [place], [place] [address]    Opiates 10/08/2021 NONE DETECTED  NONE DETECTED Final   Cocaine 10/08/2021 NONE DETECTED  NONE DETECTED Final   Benzodiazepines 10/08/2021 POSITIVE (A)  NONE DETECTED Final   Amphetamines 10/08/2021 POSITIVE (A)  NONE DETECTED Final   Tetrahydrocannabinol 10/08/2021 NONE DETECTED  NONE DETECTED Final   Barbiturates 10/08/2021 NONE DETECTED  NONE DETECTED Final   Comment: (NOTE) DRUG SCREEN FOR MEDICAL PURPOSES ONLY.  IF CONFIRMATION IS NEEDED FOR ANY PURPOSE, NOTIFY LAB WITHIN 5 DAYS.  LOWEST DETECTABLE LIMITS FOR URINE DRUG SCREEN Drug Class                     Cutoff (ng/mL) Amphetamine and metabolites    1000 Barbiturate and metabolites    200 Benzodiazepine                 200 Tricyclics and metabolites     300 Opiates and metabolites        300 Cocaine and metabolites        300 THC  50 Performed at [facility], [address] [place], [place] [address]    WBC  10/08/2021 12.6 (H)  4.0 - 10.5 K/uL Final   RBC 10/08/2021 4.65  4.22 - 5.81 MIL/uL Final   Hemoglobin 10/08/2021 14.5  13.0 - 17.0 g/dL Final   HCT 60/45/4098 42.1  39.0 - 52.0 % Final   MCV 10/08/2021 90.5  80.0 - 100.0 fL Final   MCH 10/08/2021 31.2  26.0 - 34.0 pg Final   MCHC 10/08/2021 34.4  30.0 - 36.0 g/dL Final   RDW 11/91/4782 12.9  11.5 - 15.5 % Final   Platelets 10/08/2021 247  150 - 400 K/uL Final   nRBC 10/08/2021 0.0  0.0 - 0.2 % Final   Neutrophils Relative % 10/08/2021 68  % Final   Neutro Abs 10/08/2021 8.6 (H)  1.7 - 7.7 K/uL Final   Lymphocytes Relative 10/08/2021 17  % Final   Lymphs Abs 10/08/2021 2.1  0.7 - 4.0 K/uL Final   Monocytes Relative 10/08/2021 14  % Final   Monocytes Absolute 10/08/2021 1.8 (H)  0.1 - 1.0 K/uL Final   Eosinophils Relative 10/08/2021 1  % Final   Eosinophils Absolute 10/08/2021 0.1  0.0 - 0.5 K/uL Final   Basophils Relative 10/08/2021 0  % Final   Basophils Absolute 10/08/2021 0.0  0.0 - 0.1 K/uL Final   Immature Granulocytes 10/08/2021 0  % Final   Abs Immature Granulocytes 10/08/2021 0.03  0.00 - 0.07 K/uL Final   Performed at [facility], [address] [place], [place] [address]    Blood Alcohol level:  Lab Results  Component Value Date   ETH <10 11/11/2021   ETH <10 10/08/2021    Metabolic Disorder Labs: No results found for: "HGBA1C", "MPG" No results found for: "PROLACTIN" No results found for: "CHOL", "TRIG", "HDL", "CHOLHDL", "VLDL", "LDLCALC"  Therapeutic Lab Levels: No results found for: "LITHIUM" Lab Results  Component Value Date   VALPROATE 58 11/13/2021   No results found for: "CBMZ"  Physical Findings   AIMS    Flowsheet Row Admission (Discharged) from 08/29/2015 [facility] Admission (Discharged) from 08/26/2015 in [facility]  AIMS Total Score 0 0      AUDIT    Flowsheet Row Admission (Discharged) from 08/29/2015 in [facility] Admission (Discharged) from 08/26/2015 in [facility] Ad[facility] (Discharged) [facility]  Alcohol Use Disorder Identification Test Final Score (AUDIT) PHQ2-9    Flowsheet Row ED [facility]13/2021 in [facility]  PHQ-2 Total Score 1      Flowsheet Row ED from 11/13/2021 in [facility] ED from 11/11/2021 in [facility] from 10/08/2021 in [facility]  C-SSRS RISK CATEGORY No Risk High Risk No Risk        Musculoskeletal  Strength & Muscle Tone: within normal limits Gait & Station: normal Patient leans: N/A  Psychiatric Specialty Exam  Presentation  General Appearance: Appropriate for Environment; Casual  Eye Contact:Good  Speech:Clear and Coherent; Normal Rate  Speech Volume:Normal  Handedness:Right   Mood and Affect  Mood:-- ("better than i was')  Affect:Appropriate; Congruent   Thought Process  Thought Processes:Coherent; Linear  Descriptions of Associations:Intact  Orientation:Full (Time, Place and Person)  Thought Content:WDL; Logical     Hallucinations:Hallucinations: None  Ideas  of Reference:None  Suicidal Thoughts:Suicidal Thoughts: No  Homicidal Thoughts:Homicidal Thoughts: No   Sensorium  Memory:Immediate Good; Recent Good; Remote Good  Judgment:Fair  Insight:Fair   Executive Functions  Concentration:Good  Attention Span:Good  Recall:Good  Fund of Knowledge:Good  Language:Good   Psychomotor Activity  Psychomotor Activity:Psychomotor Activity: Normal   Assets  Assets:Communication Skills; Desire for Improvement; Resilience; Physical Health   Sleep  Sleep:Sleep: Fair Number of Hours of Sleep: 4   Nutritional Assessment (For OBS and [facility] admissions only) Has the patient had a weight loss or  gain of 10 pounds or more in the last 3 months?: No Has the patient had a decrease in food intake/or appetite?: No Does the patient have dental problems?: No Does the patient have eating habits or behaviors that may be indicators of an eating disorder including binging or inducing vomiting?: No Has the patient recently lost weight without trying?: 0 Has the patient been eating poorly because of a decreased appetite?: 0 Malnutrition Screening Tool Score: 0    Physical Exam  Physical Exam Vitals reviewed.  Constitutional:      Appearance: Normal appearance.  HENT:     Head: Normocephalic and atraumatic.  Cardiovascular:     Rate and Rhythm: Normal rate and regular rhythm.     Pulses: Normal pulses.     Heart sounds: Normal heart sounds.  Pulmonary:     Effort: Pulmonary effort is normal.     Breath sounds: Normal breath sounds.  Abdominal:     General: Abdomen is flat.     Palpations: Abdomen is soft.  Musculoskeletal:        General: Normal range of motion.     Cervical back: Normal range of motion.  Neurological:     General: No focal deficit present.     Mental Status: He is alert and oriented to person, place, and time. Mental status is at baseline.  Psychiatric:        Mood and Affect: Mood normal.        Behavior: Behavior normal.        Thought Content: Thought content normal.    Review of Systems  Constitutional:  Negative for chills and fever.  Respiratory:  Negative for cough and shortness of breath.   Cardiovascular:  Negative for chest pain and palpitations.  Gastrointestinal:  Negative for diarrhea, nausea and vomiting.  Neurological:  Negative for headaches.  Psychiatric/Behavioral:  Positive for substance abuse. Negative for depression, hallucinations and suicidal ideas. The patient is nervous/anxious.    Blood pressure 109/63, pulse (!) 51, temperature 97.8 F (36.6 C), temperature source Oral, resp. rate 18, SpO2 98 %. There is no height or weight on  file to calculate BMI.  Treatment Plan Summary: Daily contact with patient to assess and evaluate symptoms and progress in treatment. Patient currently denies suicidal ideations, thoughts of self-harm, thoughts of harming others. Patient has been provided with contact information for sober living facilities and is actively calling morning. Ready to discharge to home or to a sober living program pending acceptance.   Driscilla Grammes, Student-PA 11/14/2021 10:09 AM

## 2021-11-14 NOTE — ED Notes (Signed)
Pt is in the bed sleeping. Respirations are even and unlabored. No acute distress noted. Will continue to monitor for safety. 

## 2021-11-14 NOTE — Progress Notes (Signed)
Received [name] this AM asleep in his bed without distress.

## 2021-11-14 NOTE — Progress Notes (Signed)
[name] was awaken by the provider for assessment and shortly thereafter he received his morning medications. He verbalized feeling groggy this morning. He made several phone calls before returning to his bed.

## 2021-11-14 NOTE — Discharge Instructions (Signed)
Take all medications as prescribed by his/her mental healthcare provider. Report any adverse effects and or reactions from the medicines to your outpatient provider promptly. Do not engage in alcohol and or illegal drug use while on prescription medicines. In the event of worsening symptoms, call the crisis hotline, 911 and or go to the nearest ED for appropriate evaluation and treatment of symptoms. follow-up with your primary care provider for your other medical issues, concerns and or health care needs.  Marland Kitchenkl

## 2021-11-14 NOTE — Progress Notes (Signed)
[name] was accepted to the [facility] house, he received his AVS,  and his questions answered. He received his medications and retrieved his personal belongings. He was escorted  to the lobby to wait for Safe Transport to take him to [facility] long parking lot to his car.

## 2021-12-11 ENCOUNTER — Telehealth (HOSPITAL_COMMUNITY): Payer: Self-pay | Admitting: Family Medicine

## 2021-12-11 NOTE — BH Assessment (Signed)
Care Management - [facility] Follow Up Discharges   Writer attempted to make contact with patient today and was unsuccessful.  Writer left a HIPPA compliant voice message.   Per chart review, patient was accepted to the [facility].

## 2022-01-09 ENCOUNTER — Encounter (HOSPITAL_COMMUNITY): Payer: Self-pay | Admitting: Emergency Medicine

## 2022-01-09 ENCOUNTER — Emergency Department (HOSPITAL_COMMUNITY)
Admission: EM | Admit: 2022-01-09 | Discharge: 2022-01-09 | Disposition: A | Discharge status: Home / Self Care | Payer: Medicare HMO | Attending: Emergency Medicine | Admitting: Emergency Medicine

## 2022-01-09 ENCOUNTER — Other Ambulatory Visit: Payer: Self-pay

## 2022-01-09 ENCOUNTER — Ambulatory Visit (HOSPITAL_COMMUNITY)
Admission: RE | Admit: 2022-01-09 | Discharge: 2022-01-09 | Disposition: A | Discharge status: Home / Self Care | Payer: Medicare HMO | Attending: Psychiatry | Admitting: Psychiatry

## 2022-01-09 DIAGNOSIS — Z7151 Drug abuse counseling and surveillance of drug abuser: Secondary | ICD-10-CM | POA: Insufficient documentation

## 2022-01-09 DIAGNOSIS — Z046 Encounter for general psychiatric examination, requested by authority: Secondary | ICD-10-CM | POA: Diagnosis not present

## 2022-01-09 DIAGNOSIS — Z5181 Encounter for therapeutic drug level monitoring: Secondary | ICD-10-CM | POA: Insufficient documentation

## 2022-01-09 DIAGNOSIS — Z79899 Other long term (current) drug therapy: Secondary | ICD-10-CM

## 2022-01-09 NOTE — ED Provider Notes (Signed)
[facility]  HOSPITAL-EMERGENCY DEPT Provider Note   CSN: [id number] Arrival date & time: 01/09/22  1338     History  Chief Complaint  Patient presents with   Psychiatric Evaluation    [name] is a 41 y.o. male.  Patient presents to hospital complaining of a need for medical clearance to get admitted to a detox facility in [place].  Patient states he needs psychiatric eval to clear him to discontinue the use of all of his behavioral medicine prescriptions.  He has no new complaints today.  He does endorse using meth earlier today.  Past medical history significant for substance abuse, bipolar 1 disorder, heart murmur, depression  HPI     Home Medications Prior to Admission medications   Medication Sig Start Date End Date Taking? Authorizing Provider  benztropine (COGENTIN) 0.5 MG tablet Take 1 tablet (0.5 mg total) by mouth at bedtime. 11/14/21   Estella Husk, MD  busPIRone (BUSPAR) 7.5 MG tablet Take 1 tablet (7.5 mg total) by mouth 2 (two) times daily. 11/14/21   Estella Husk, MD  divalproex (DEPAKOTE) 500 MG DR tablet Take 1 tablet (500 mg total) by mouth 2 (two) times daily. 11/14/21   Estella Husk, MD  FLUoxetine (PROZAC) 20 MG capsule Take 1 capsule (20 mg total) by mouth every morning. 11/14/21   Estella Husk, MD  OLANZapine (ZYPREXA) 10 MG tablet Take 1 tablet (10 mg total) by mouth at bedtime. 11/14/21   Estella Husk, MD  sildenafil (VIAGRA) 50 MG tablet Take 50 mg by mouth daily as needed for erectile dysfunction. 09/07/21   [provider]      Allergies    Patient has no known allergies.    Review of Systems   Review of Systems Negative except as noted in HPI Physical Exam Updated Vital Signs BP (!) 119/95   Pulse 96   Temp 98.6 F (37 C) (Oral)   Resp 18   SpO2 98%  Physical Exam Vitals and nursing note reviewed.  Constitutional:      General: He is not in acute distress. HENT:     Head:  Normocephalic and atraumatic.  Eyes:     Conjunctiva/sclera: Conjunctivae normal.  Cardiovascular:     Rate and Rhythm: Normal rate.  Pulmonary:     Effort: Pulmonary effort is normal.  Musculoskeletal:     Cervical back: Normal range of motion and neck supple.  Skin:    General: Skin is warm and dry.  Neurological:     Mental Status: He is alert and oriented to person, place, and time.  Psychiatric:        Behavior: Behavior normal.     ED Results / Procedures / Treatments   Labs (all labs ordered are listed, but only abnormal results are displayed) Labs Reviewed - No data to display  EKG None  Radiology No results found.  Procedures Procedures    Medications Ordered in ED Medications - No data to display  ED Course/ Medical Decision Making/ A&P                           Medical Decision Making  The patient presents requesting a psychiatric evaluation for possible discontinuation of his current medications.  I explained that this is something that we will be unable to do in the emergency department.  I suggested the patient follow-up with [facility], who currently prescribes all of his medications.  The patient states that he understands the situation and will follow-up with [facility].  He has no complaints today which would necessitate lab work or imaging.  Discharge home        Final Clinical Impression(s) / ED Diagnoses Final diagnoses:  Medication management    Rx / DC Orders ED Discharge Orders     None         Pamala Duffel 01/09/22 1529    Long, Arlyss Repress, MD 01/10/22 2249

## 2022-01-09 NOTE — H&P (Signed)
Behavioral Health Medical Screening Exam  HPI: [name] is a 41 y.o. Caucasian male who presents voluntarily accompanied by his girlfriend to [facility] requesting for medical clearance due to taking 3 g of methamphetamine on a daily basis.  Patient has past psychiatric / medical history of bipolar affective disorder, bipolar affective disorder, currently depressed mild, cocaine dependence, substance abuse, substance-induced mood disorder, depression and heart murmur.    Assessment: Patient's appeared disgruntled and irritable with extra movement to arms and legs and entire body.  Reports active amphetamine use this morning.  Expresses that he just wanted medical clearance so that he could go to none-medical facility in [place] for long-term methamphetamine treatment.  This provider attempted to discuss inpatient criteria which he did not meet, and local detox resources, however patient refused.  Blood pressure 117/94, pulse rate 105, respiratory rate 18.  [facility] long hospital ED physician called and report provided.  Patient sent to [facility] long ED for medical clearance and discharge.  He denied suicidal ideation, homicidal ideation, paranoia, delusion, auditory or visual hallucinations. Denied suicidal attempt in the past or current, denied self-injurious behavior, denies use of alcohol. Rates anxiety as 4/10 on a scale of 0-10, with 10 being the worst. Denies any symptoms of depression, denies taking any medication at this time for fear of rejection at the facility in [place]. Reports seeing a psychiatrist at [facility] who is managing his care. Reports smoking half a pack of cigarette daily.  Instructions provided on cessation of polysubstance uses due to adverse consequences on the body system and his thinking process. Denies history of abuse or access to firearms.  Disposition: Sent to [facility] long ED accompanied by girlfriend.  Patient is psych cleared and does not  appear to be in danger to himself or others.  Total Time spent with patient: 1 hour  Psychiatric Specialty Exam:  Presentation  General Appearance: Bizarre; Appropriate for Environment; Fairly Groomed  Eye Contact:Good  Speech:Clear and Coherent; Pressured  Speech Volume:Increased  Handedness:Right  Mood and Affect  Mood:Dysphoric  Affect:Blunt  Thought Process  Thought Processes:Linear  Descriptions of Associations:Intact  Orientation:Full (Time, Place and Person)  Thought Content:Rumination  History of Schizophrenia/Schizoaffective disorder:No data recorded Duration of Psychotic Symptoms:No data recorded Hallucinations:Hallucinations: None  Ideas of Reference:None  Suicidal Thoughts:Suicidal Thoughts: No  Homicidal Thoughts:Homicidal Thoughts: No  Sensorium  Memory:Immediate Fair; Recent Fair; Remote Fair  Judgment:Fair  Insight:Fair  Executive Functions  Concentration:Fair  Attention Span:Good  Recall:Fair  Fund of Knowledge:Fair  Language:Fair  Psychomotor Activity  Psychomotor Activity:Psychomotor Activity: Restlessness; Mannerisms  Assets  Assets:Communication Skills; Housing; Physical Health; Desire for Improvement  Sleep  Sleep:Sleep: Poor Number of Hours of Sleep: 0  Physical Exam: Physical Exam Vitals and nursing note reviewed.  Constitutional:      Appearance: Normal appearance.  HENT:     Head: Normocephalic and atraumatic.     Right Ear: External ear normal.     Left Ear: External ear normal.     Nose: Nose normal.     Mouth/Throat:     Mouth: Mucous membranes are moist.     Pharynx: Oropharynx is clear.  Eyes:     Conjunctiva/sclera: Conjunctivae normal.     Pupils: Pupils are equal, round, and reactive to light.  Cardiovascular:     Rate and Rhythm: Tachycardia present.     Comments: BP 107/94, P 105 Pulmonary:     Effort: Pulmonary effort is normal.  Genitourinary:    Comments: deferred Musculoskeletal:  General: Normal range of motion.     Cervical back: Normal range of motion.  Skin:    General: Skin is warm.  Neurological:     General: No focal deficit present.     Mental Status: He is alert and oriented to person, place, and time.  Psychiatric:     Comments: Awkward and bizarre      Review of Systems  Constitutional: Negative.  Negative for chills and fever.  HENT: Negative.  Negative for hearing loss and tinnitus.   Eyes: Negative.  Negative for blurred vision and double vision.  Respiratory: Negative.  Negative for cough, sputum production, shortness of breath and wheezing.   Cardiovascular: Negative.  Negative for chest pain and palpitations.  Gastrointestinal: Negative.  Negative for heartburn and nausea.  Genitourinary: Negative.  Negative for dysuria, frequency and urgency.  Musculoskeletal: Negative.  Negative for myalgias and neck pain.  Skin: Negative.  Negative for itching and rash.  Neurological: Negative.  Negative for dizziness and headaches.  Endo/Heme/Allergies: Negative.  Negative for environmental allergies. Does not bruise/bleed easily.  Psychiatric/Behavioral:  Positive for depression (Hx of depression), substance abuse and suicidal ideas (Hx os suicidal ideation). The patient is nervous/anxious and has insomnia.    Blood pressure (!) 117/94, pulse (!) 105, temperature 98.5 F (36.9 C), temperature source Oral, resp. rate 18. There is no height or weight on file to calculate BMI.  Musculoskeletal: Strength & Muscle Tone: within normal limits Gait & Station: normal Patient leans: N/A  [place] Scale:  Flowsheet Row OP [facility] from 11/13/2021 in [facility] ED from 11/11/2021 in [facility]Y [facility]  C-SSRS RISK CATEGORY No Risk No Risk High Risk       Recommendations:  Based on my evaluation the patient appears to have an emergency  medical condition for which I recommend the patient be transferred to the emergency department for further evaluation.  Cecilie Lowers, FNP 01/09/2022, 1:02 PM

## 2022-01-09 NOTE — ED Notes (Signed)
Patient left before I could give him discharge paperwork

## 2022-01-09 NOTE — ED Notes (Signed)
Patient is not in the room.

## 2022-01-09 NOTE — Discharge Instructions (Signed)
You were seen today requesting clearance to discontinue your current medications which you no longer take.  As discussed, I recommend follow-up with [facility] who currently prescribes your medications for further management.

## 2022-01-09 NOTE — ED Triage Notes (Signed)
Patient would like to be admitted into a long term treatment facility. In order to qualify for the program he has to be off of some of his medications. He is here for evaluation and to be taken off of those medications. He has taken himself off for a week and does not believe he needs them anymore.

## 2022-01-10 ENCOUNTER — Encounter (HOSPITAL_COMMUNITY): Payer: Self-pay

## 2022-01-10 ENCOUNTER — Emergency Department (HOSPITAL_COMMUNITY)
Admission: EM | Admit: 2022-01-10 | Discharge: 2022-01-10 | Disposition: A | Discharge status: Home / Self Care | Payer: Medicare HMO | Attending: Emergency Medicine | Admitting: Emergency Medicine

## 2022-01-10 ENCOUNTER — Ambulatory Visit (HOSPITAL_COMMUNITY)
Admission: AD | Admit: 2022-01-10 | Discharge: 2022-01-10 | Disposition: A | Discharge status: Home / Self Care | Payer: Medicare HMO | Attending: Psychiatry | Admitting: Psychiatry

## 2022-01-10 ENCOUNTER — Other Ambulatory Visit: Payer: Self-pay

## 2022-01-10 DIAGNOSIS — Z79899 Other long term (current) drug therapy: Secondary | ICD-10-CM | POA: Insufficient documentation

## 2022-01-10 DIAGNOSIS — F419 Anxiety disorder, unspecified: Secondary | ICD-10-CM | POA: Insufficient documentation

## 2022-01-10 DIAGNOSIS — F1510 Other stimulant abuse, uncomplicated: Secondary | ICD-10-CM | POA: Diagnosis present

## 2022-01-10 DIAGNOSIS — Z20822 Contact with and (suspected) exposure to covid-19: Secondary | ICD-10-CM | POA: Insufficient documentation

## 2022-01-10 LAB — CBC WITH DIFFERENTIAL/PLATELET
Abs Immature Granulocytes: 0.03 10*3/uL (ref 0.00–0.07)
Basophils Absolute: 0 10*3/uL (ref 0.0–0.1)
Basophils Relative: 0 %
Eosinophils Absolute: 0.2 10*3/uL (ref 0.0–0.5)
Eosinophils Relative: 3 %
HCT: 44.8 % (ref 39.0–52.0)
Hemoglobin: 15 g/dL (ref 13.0–17.0)
Immature Granulocytes: 0 %
Lymphocytes Relative: 22 %
Lymphs Abs: 1.8 10*3/uL (ref 0.7–4.0)
MCH: 30.3 pg (ref 26.0–34.0)
MCHC: 33.5 g/dL (ref 30.0–36.0)
MCV: 90.5 fL (ref 80.0–100.0)
Monocytes Absolute: 0.8 10*3/uL (ref 0.1–1.0)
Monocytes Relative: 10 %
Neutro Abs: 5.2 10*3/uL (ref 1.7–7.7)
Neutrophils Relative %: 65 %
Platelets: 228 10*3/uL (ref 150–400)
RBC: 4.95 MIL/uL (ref 4.22–5.81)
RDW: 12.6 % (ref 11.5–15.5)
WBC: 8 10*3/uL (ref 4.0–10.5)
nRBC: 0 % (ref 0.0–0.2)

## 2022-01-10 LAB — COMPREHENSIVE METABOLIC PANEL
ALT: 46 U/L — ABNORMAL HIGH (ref 0–44)
AST: 35 U/L (ref 15–41)
Albumin: 4.7 g/dL (ref 3.5–5.0)
Alkaline Phosphatase: 65 U/L (ref 38–126)
Anion gap: 11 (ref 5–15)
BUN: 20 mg/dL (ref 6–20)
CO2: 21 mmol/L — ABNORMAL LOW (ref 22–32)
Calcium: 9.2 mg/dL (ref 8.9–10.3)
Chloride: 104 mmol/L (ref 98–111)
Creatinine, Ser: 0.95 mg/dL (ref 0.61–1.24)
GFR, Estimated: >60 mL/min (ref >60)
Glucose, Bld: 131 mg/dL — ABNORMAL HIGH (ref 70–99)
Potassium: 3.6 mmol/L (ref 3.5–5.1)
Sodium: 136 mmol/L (ref 135–145)
Total Bilirubin: 1.8 mg/dL — ABNORMAL HIGH (ref 0.3–1.2)
Total Protein: 7.5 g/dL (ref 6.5–8.1)

## 2022-01-10 LAB — RESP PANEL BY RT-PCR (FLU A&B, COVID) ARPGX2
Influenza A by PCR: NEGATIVE
Influenza B by PCR: NEGATIVE
SARS Coronavirus 2 by RT PCR: NEGATIVE

## 2022-01-10 LAB — ETHANOL: Alcohol, Ethyl (B): <10 mg/dL (ref <10)

## 2022-01-10 LAB — RAPID URINE DRUG SCREEN, HOSP PERFORMED
Amphetamines: POSITIVE — AB
Barbiturates: NOT DETECTED
Benzodiazepines: NOT DETECTED
Cocaine: NOT DETECTED
Opiates: NOT DETECTED
Tetrahydrocannabinol: NOT DETECTED

## 2022-01-10 MED ORDER — LORAZEPAM 1 MG PO TABS
1.0000 mg | ORAL_TABLET | Freq: Once | ORAL | Status: AC
Start: 2022-01-10 — End: 2022-01-10
  Administered 2022-01-10: 1 mg via ORAL
  Filled 2022-01-10: qty 1

## 2022-01-10 NOTE — H&P (Signed)
Behavioral Health Medical Screening Exam  HPI: [name] is a 41 y.o. male who presents voluntarily as a walk-in to [facility] for suicidal thoughts without plans or intent.  Patient was sent to the [facility], ED yesterday for medical clearance due to use of heavy methamphetamine.  Patient left [facility] yesterday and returned back today to [facility] ED for complaint of suicidal thoughts.  He then left [facility] and came to [facility] for same complaint.  Not sure if patient is homeless.  Patient reports above information and informed this provider that he is having suicidal thoughts with no plans.  He further demanded to be sent to [facility], [facility], or [facility] for rehabilitation or in medical facility. Denies homicidal ideation, paranoia, delusions, or auditory or visual hallucination. Denies suicidal attempt in the past or present and denies self-injurious behavior Endorses sleeping for 2 hours last night and having friends or support system Rates anxiety as 10/10 with 10 being the worst. Endorses symptoms of depression and characterizes them as self-isolation, crying spells, irritability, hopelessness, worthlessness, guilt, and poor concentration. Reports being seen by a therapist and psychiatrist from [place], however, did not relate when.  Denies family history of mental illness. Denies taking medication for his diagnosis, states he has been on Depakote, Prozac, and lorazepam but has not taking them in 2 weeks. Denies alcohol use, tobacco use, however, endorses heavy use of methamphetamine with last use on Tuesday at 4 AM.  Assessment: On assessment today patient was seen face-to-face and examined in the screening room.  Appears angry and agitated.  Chart reviewed and findings shared with the treatment team and consult with Dr. Lucianne Muss.  Patient alert and oriented to name, situation, place, time.  Presents with angry and anxious mood and blunted  affect.  Thought process linear and thought content with rumination.  Memory is fair, judgment is poor and insight shallow.  Made patient aware that provider cannot send him to [place], [place] or [place] he will [facility].  However, outpatient resources for psychiatric and counseling provided to patient.  Disposition: Make patient aware that he does not meet the criteria for admission or to be sent to above places that he requested.  Patient became very angry and belligerent and stormed out of the facility.   Total Time spent with patient: 1 hour  Psychiatric Specialty Exam:  Presentation  General Appearance: Appropriate for Environment; Casual; Fairly Groomed  Eye Contact:Good  Speech:Clear and Coherent; Normal Rate  Speech Volume:Normal  Handedness:Right   Mood and Affect  Mood:Angry; Anxious  Affect:Blunt   Thought Process  Thought Processes:Linear  Descriptions of Associations:Loose  Orientation:Full (Time, Place and Person)  Thought Content:Rumination  History of Schizophrenia/Schizoaffective disorder:No data recorded Duration of Psychotic Symptoms:No data recorded Hallucinations:Hallucinations: None  Ideas of Reference:None  Suicidal Thoughts:Suicidal Thoughts: Yes, Passive SI Passive Intent and/or Plan: Without Intent; Without Plan; Without Access to Means  Homicidal Thoughts:Homicidal Thoughts: No   Sensorium  Memory:Immediate Fair; Recent Fair; Remote Fair  Judgment:Poor  Insight:Shallow   Executive Functions  Concentration:Fair  Attention Span:Fair  Recall:Fair  Fund of Knowledge:Fair  Language:Fair   Psychomotor Activity  Psychomotor Activity:Psychomotor Activity: Restlessness; Mannerisms   Assets  Assets:Communication Skills; Physical Health   Sleep  Sleep:Sleep: Poor Number of Hours of Sleep: 2 Memory is fair, judgment is poor and insight shallow Physical Exam: Physical Exam Vitals and nursing note  reviewed.  Constitutional:      Appearance:  Normal appearance.  HENT:     Head: Normocephalic and atraumatic.     Right Ear: External ear normal.     Left Ear: External ear normal.     Nose: Nose normal.     Mouth/Throat:     Mouth: Mucous membranes are moist.     Pharynx: Oropharynx is clear.  Eyes:     Extraocular Movements: Extraocular movements intact.     Conjunctiva/sclera: Conjunctivae normal.     Pupils: Pupils are equal, round, and reactive to light.  Cardiovascular:     Rate and Rhythm: Normal rate.     Pulses: Normal pulses.  Pulmonary:     Effort: Pulmonary effort is normal.  Abdominal:     Palpations: Abdomen is soft.  Genitourinary:    Comments: deferred Musculoskeletal:        General: Normal range of motion.     Cervical back: Normal range of motion and neck supple.  Skin:    General: Skin is warm.  Neurological:     General: No focal deficit present.     Mental Status: He is alert and oriented to person, place, and time.  Psychiatric:     Comments: agitated    Review of Systems  Constitutional: Negative.  Negative for chills and fever.  HENT: Negative.  Negative for hearing loss and tinnitus.   Eyes: Negative.  Negative for blurred vision and double vision.  Respiratory: Negative.  Negative for cough, sputum production, shortness of breath and wheezing.   Cardiovascular: Negative.  Negative for chest pain and palpitations.  Gastrointestinal: Negative.  Negative for heartburn and nausea.  Genitourinary: Negative.  Negative for dysuria, frequency and urgency.  Musculoskeletal: Negative.  Negative for myalgias and neck pain.  Skin: Negative.  Negative for itching and rash.  Neurological: Negative.  Negative for dizziness, tingling and headaches.  Endo/Heme/Allergies: Negative.  Negative for environmental allergies and polydipsia. Does not bruise/bleed easily.  Psychiatric/Behavioral:  Positive for depression, substance abuse and suicidal ideas. The  patient is nervous/anxious and has insomnia.    There were no vitals taken for this visit. There is no height or weight on file to calculate BMI.  Musculoskeletal: Strength & Muscle Tone: within normal limits Gait & Station: normal Patient leans: N/A  [place] Scale:  Flowsheet Row ED from 01/10/2022 in [facility] Most recent reading at 01/10/2022  7:35 AM ED from 01/09/2022 in [facility]ENCY DEPT Most recent reading at 01/09/2022  1:49 PM OP Visit from [facility]09/2022 [facility] Most recent reading at 01/09/2022  1:01 PM  C-SSRS RISK CATEGORY Low Risk No Risk No Risk       Recommendations:  Based on my evaluation the patient does not appear to have an emergency medical condition.  Cecilie Lowers, FNP 01/10/2022, 8:11 PM

## 2022-01-10 NOTE — ED Notes (Signed)
Pt changed into burgundy scrubs, belongings in pt pt belonging bags, pt wanded by security.

## 2022-01-10 NOTE — ED Provider Triage Note (Signed)
Emergency Medicine Provider Triage Evaluation Note  [name] , a 41 y.o. male  was evaluated in triage.  Pt complains of substance abuse.  Review of Systems  Positive: Patient uses meth, anxiety not sure what happened yesterday, endorses that he was suicidal this morning when he woke up Negative: Not currently suicidal  Physical Exam  BP 135/80   Pulse 93   Temp (!) 97.5 F (36.4 C) (Oral)   Resp 16   Ht 1.803 m (5\' 11" )   Wt 85.3 kg   SpO2 99%   BMI 26.22 kg/m  Gen:   Awake, no distress   Resp:  Normal effort  MSK:   Moves extremities without difficulty  Other:  Patient appears somewhat agitated  Medical Decision Making  Medically screening exam initiated at 7:47 AM.  Appropriate orders placed.  [name] was informed that the remainder of the evaluation will be completed by another provider, this initial triage assessment does not replace that evaluation, and the importance of remaining in the ED until their evaluation is complete.  Patient does not meet qualifications for involuntary commitment at this time Will obtain medical clearance   Jaime Kaufman, MD 01/10/22 [id number]

## 2022-01-10 NOTE — ED Triage Notes (Signed)
Pt states he was seen yesterday to have a medical clearance in hopes to be accepted in a rehab program he found in [place]. Pt reports use of meth yesterday. He left the ED and states he blacked out several times. Pt now endorses SI at this time, without intent or plan. Denies HI

## 2022-01-10 NOTE — ED Provider Notes (Signed)
[facility]-EMERGENCY DEPT Provider Note   CSN: [id number] Arrival date & time: 01/10/22  9833     History  Chief Complaint  Patient presents with   Psychiatric Evaluation   Suicidal    [name] is a 41 y.o. male.  HPI 41 year old male presents today with methamphetamine abuse.  [facility].  At that time he denied suicidality.  He now states that he was feeling like harming himself since he slept outside last night.  He has no plan and says he does not currently feel suicidal.  He does state that he feels very anxious.  He has a long history of substance abuse.  He states he has been depressed and been on medications for a long time but cannot tell any difference as he has not been taking them for the past several weeks.  He has also reported a plan to go to a facility in [place].     Home Medications Prior to Admission medications   Medication Sig Start Date End Date Taking? Authorizing Provider  benztropine (COGENTIN) 0.5 MG tablet Take 1 tablet (0.5 mg total) by mouth at bedtime. 11/14/21   Estella Husk, MD  busPIRone (BUSPAR) 7.5 MG tablet Take 1 tablet (7.5 mg total) by mouth 2 (two) times daily. 11/14/21   Estella Husk, MD  divalproex (DEPAKOTE) 500 MG DR tablet Take 1 tablet (500 mg total) by mouth 2 (two) times daily. 11/14/21   Estella Husk, MD  FLUoxetine (PROZAC) 20 MG capsule Take 1 capsule (20 mg total) by mouth every morning. 11/14/21   Estella Husk, MD  OLANZapine (ZYPREXA) 10 MG tablet Take 1 tablet (10 mg total) by mouth at bedtime. 11/14/21   Estella Husk, MD  sildenafil (VIAGRA) 50 MG tablet Take 50 mg by mouth daily as needed for erectile dysfunction. 09/07/21   [provider]      Allergies    Patient has no known allergies.    Review of Systems   Review of Systems  Physical Exam Updated Vital Signs BP 126/87 (BP Location: Right Arm)    Pulse 92   Temp 97.7 F (36.5 C) (Oral)   Resp 18   Ht 1.803 m (5\' 11" )   Wt 85.3 kg   SpO2 100%   BMI 26.22 kg/m  Physical Exam Vitals and nursing note reviewed.  Constitutional:      Appearance: Normal appearance.  HENT:     Head: Normocephalic.     Right Ear: External ear normal.     Left Ear: External ear normal.     Nose: Nose normal.  Eyes:     Extraocular Movements: Extraocular movements intact.     Pupils: Pupils are equal, round, and reactive to light.  Cardiovascular:     Rate and Rhythm: Normal rate.  Pulmonary:     Effort: Pulmonary effort is normal.     Breath sounds: Normal breath sounds.  Abdominal:     General: Abdomen is flat. Bowel sounds are normal.     Palpations: Abdomen is soft.  Musculoskeletal:        General: Normal range of motion.     Cervical back: Normal range of motion.  Skin:    General: Skin is warm and dry.     Capillary Refill: Capillary refill takes less than 2 seconds.  Neurological:     General: No focal deficit present.  Mental Status: He is alert.  Psychiatric:        Attention and Perception: Attention normal.        Mood and Affect: Mood is anxious.        Speech: Speech normal.        Behavior: Behavior normal.        Thought Content: Thought content normal.        Cognition and Memory: Cognition and memory normal.        Judgment: Judgment normal.     ED Results / Procedures / Treatments   Labs (all labs ordered are listed, but only abnormal results are displayed) Labs Reviewed  COMPREHENSIVE METABOLIC PANEL - Abnormal; Notable for the following components:      Result Value   CO2 21 (*)    Glucose, Bld 131 (*)    ALT 46 (*)    Total Bilirubin 1.8 (*)    All other components within normal limits  RAPID URINE DRUG SCREEN, HOSP PERFORMED - Abnormal; Notable for the following components:   Amphetamines POSITIVE (*)    All other components within normal limits  RESP PANEL BY RT-PCR (FLU A&B, COVID) ARPGX2   ETHANOL  CBC WITH DIFFERENTIAL/PLATELET    EKG None  Radiology No results found.  Procedures Procedures    Medications Ordered in ED Medications  LORazepam (ATIVAN) tablet 1 mg (1 mg Oral Given 01/10/22 0856)    ED Course/ Medical Decision Making/ A&P                           Medical Decision Making 41 year old male with history of substance abuse presents today after using methamphetamine.  Initially states that he had thought of harming himself but on multiple reinterview states he is not suicidal at this time.  He has been medically cleared with normal labs and urine drug screen positive for methamphetamine.  Hemodynamically he is stable here.  He has been resting comfortably after 1 mg of Ativan. I reevaluated the patient discussed findings with him.  He would like referrals to substance abuse programs.  He will be given the community referrals. Patient appears stable for discharge  Amount and/or Complexity of Data Reviewed Labs: ordered. Decision-making details documented in ED Course.  Risk Prescription drug management.           Final Clinical Impression(s) / ED Diagnoses Final diagnoses:  Methamphetamine abuse [facility])    Rx / DC Orders ED Discharge Orders     None         Margarita Grizzle, MD 01/10/22 1255

## 2022-01-13 ENCOUNTER — Ambulatory Visit (HOSPITAL_COMMUNITY)
Admission: EM | Admit: 2022-01-13 | Discharge: 2022-01-13 | Disposition: A | Discharge status: Home / Self Care | Payer: Medicare HMO | Attending: Psychiatry | Admitting: Psychiatry

## 2022-01-13 DIAGNOSIS — Z5902 Unsheltered homelessness: Secondary | ICD-10-CM | POA: Insufficient documentation

## 2022-01-13 DIAGNOSIS — Z760 Encounter for issue of repeat prescription: Secondary | ICD-10-CM | POA: Diagnosis not present

## 2022-01-13 DIAGNOSIS — F1910 Other psychoactive substance abuse, uncomplicated: Secondary | ICD-10-CM | POA: Insufficient documentation

## 2022-01-13 NOTE — Discharge Instructions (Signed)
Take all medications as prescribed. Keep all follow-up appointments as scheduled.  Do not consume alcohol or use illegal drugs while on prescription medications. Report any adverse effects from your medications to your primary care provider promptly.  In the event of recurrent symptoms or worsening symptoms, call 911, a crisis hotline, or go to the nearest emergency department for evaluation.   

## 2022-01-13 NOTE — ED Provider Notes (Signed)
Behavioral Health Urgent Care Medical Screening Exam  Patient Name: [name] MRN: [id number] Date of Evaluation: 01/13/22 Chief Complaint:   Diagnosis:  Final diagnoses:  Substance abuse (HCC)    History of Present illness: patient presented to [facility] as a walk in requesting medication refills in order to get into the [facility] house.  States he last used methamphetamines 5 days ago and is trying to stay clean. Stated he is going to walk-in on Monday morning so that he can stay for a 9 months, so that he can get his life back on track.   He is denying suicidal homicidal ideations.  Denies auditory visual hallucinations.  States he has been residing in his car and has been to [facility] seeking admission until he is able to follow-up with [facility] house Monday.  States he recently restarted taking his medications that he was prescribed on his previous admission to the facility based crisis.  He denied that he has followed up with [facility] for medication management.   During evaluation [name] is sitting in no acute distress. he is alert/oriented x 4; calm/cooperative; and mood congruent with affect.  is speaking in a clear tone at moderate volume, and normal pace; with good eye contact. His thought process is coherent and relevant; There is no indication that he is currently responding to internal/external stimuli or experiencing delusional thought content; and he has denied suicidal/self-harm/homicidal ideation, psychosis, and paranoia.  Patient has remained calm throughout assessment and has answered questions appropriately.     At this time [name] is educated and verbalizes understanding of mental health resources and other crisis services in the community. he is instructed to call 911 and present to the nearest emergency room should he experience any suicidal/homicidal ideation, auditory/visual/hallucinations, or detrimental worsening of his mental health condition. he was  a also advised by Clinical research associate that he could call the toll-free phone on insurance card to assist with identifying in network counselors and agencies or number on back of Medicaid card to speak with care coordinator.   Psychiatric Specialty Exam  Presentation  General Appearance:Appropriate for Environment; Casual; Fairly Groomed  Eye Contact:Good  Speech:Clear and Coherent; Normal Rate  Speech Volume:Normal  Handedness:Right   Mood and Affect  Mood:Angry; Anxious  Affect:Blunt   Thought Process  Thought Processes:Linear  Descriptions of Associations:Loose  Orientation:Full (Time, Place and Person)  Thought Content:Rumination    Hallucinations:None  Ideas of Reference:None  Suicidal Thoughts:Yes, Passive Without Intent; Without Plan; Without Access to Means  Homicidal Thoughts:No   Sensorium  Memory:Immediate Fair; Recent Fair; Remote Fair  Judgment:Poor  Insight:Shallow   Executive Functions  Concentration:Fair  Attention Span:Fair  Recall:Fair  Fund of Knowledge:Fair  Language:Fair   Psychomotor Activity  Psychomotor Activity:Restlessness; Mannerisms   Assets  Assets:Communication Skills; Physical Health   Sleep  Sleep:Poor  Number of hours: 2   No data recorded  Physical Exam: Physical Exam Vitals and nursing note reviewed.  Cardiovascular:     Rate and Rhythm: Normal rate.  Pulmonary:     Effort: Pulmonary effort is normal.  Neurological:     Mental Status: He is alert.  Psychiatric:        Attention and Perception: Attention and perception normal.        Mood and Affect: Mood normal.        Speech: Speech normal.        Behavior: Behavior normal.        Thought Content: Thought content  normal.        Cognition and Memory: Cognition and memory normal.        Judgment: Judgment normal.    Review of Systems  HENT: Negative.    Cardiovascular: Negative.   Gastrointestinal: Negative.   Genitourinary: Negative.    Musculoskeletal: Negative.   Skin: Negative.   Psychiatric/Behavioral:  Positive for depression and substance abuse. The patient is nervous/anxious.   All other systems reviewed and are negative.  Blood pressure 126/76, pulse 73, temperature 97.6 F (36.4 C), temperature source Oral, resp. rate 18, height 5\' 11"  (1.803 m), weight 190 lb (86.2 kg), SpO2 98 %. Body mass index is 26.5 kg/m.  Musculoskeletal: Strength & Muscle Tone: within normal limits Gait & Station: normal Patient leans: N/A   [facility] MSE Discharge Disposition for Follow up and Recommendations: Based on my evaluation the patient does not appear to have an emergency medical condition and can be discharged with resources and follow up care in outpatient services for Substance Abuse Intensive Outpatient Program   , NP 01/13/2022, 12:22 PM

## 2022-01-13 NOTE — BH Assessment (Signed)
Patient was seen and triaged at 1145 hours  Patient is a 41 year old male with a hx significant for amphetamine use and depressed who presents voluntary requesting medication refills on his mental health medications. Patient states he has not use any substances in over 5 days and is attempting to get into a recovery facility [facility]) on Monday 8/14. Patient states he would like to have his medications refilled prior into entering treatment. Patient denies any S/I, H/I or AVH. Patient was seen by provider Melvyn Neth NP who writes this date:    History of Present illness: patient presented to [facility] as a walk in requesting medication refills in order to get into the [facility] house.  States he last used methamphetamines 5 days ago and is trying to stay clean. Stated he is going to walk-in on Monday morning so that he can stay for a 9 months, so that he can get his life back on track.   He is denying suicidal homicidal ideations.  Denies auditory visual hallucinations.  States he has been residing in his car and has been to [facility] seeking admission until he is able to follow-up with [facility] house Monday.  States he recently restarted taking his medications that he was prescribed on his previous admission to the facility based crisis.  He denied that he has followed up with [facility] for medication management.    During evaluation JAXSIN BOTTOMLEY is sitting in no acute distress. he is alert/oriented x 4; calm/cooperative; and mood congruent with affect.  is speaking in a clear tone at moderate volume, and normal pace; with good eye contact. His thought process is coherent and relevant; There is no indication that he is currently responding to internal/external stimuli or experiencing delusional thought content; and he has denied suicidal/self-harm/homicidal ideation, psychosis, and paranoia.  Patient has remained calm throughout assessment and has answered questions appropriately.

## 2022-01-25 ENCOUNTER — Telehealth (HOSPITAL_COMMUNITY): Payer: Self-pay

## 2022-01-25 NOTE — BH Assessment (Signed)
Care Management - [facility] Follow Up Discharges   Writer attempted to make contact with patient today and was unsuccessful.  Phone just rang with no answer.  Per chart review, patient reports that he will be following up for substance abuse services at the [facility] on 01-15-2022.

## 2022-03-05 ENCOUNTER — Encounter (HOSPITAL_COMMUNITY): Payer: Self-pay | Admitting: Psychiatry

## 2022-03-05 ENCOUNTER — Other Ambulatory Visit: Payer: Self-pay

## 2022-03-05 ENCOUNTER — Inpatient Hospital Stay (HOSPITAL_COMMUNITY)
Admission: AD | Admit: 2022-03-05 | Discharge: 2022-03-08 | DRG: 885 | Disposition: A | Discharge status: Home / Self Care | Payer: Medicare HMO | Attending: Psychiatry | Admitting: Psychiatry

## 2022-03-05 DIAGNOSIS — F1424 Cocaine dependence with cocaine-induced mood disorder: Secondary | ICD-10-CM | POA: Diagnosis present

## 2022-03-05 DIAGNOSIS — Z91148 Patient's other noncompliance with medication regimen for other reason: Secondary | ICD-10-CM

## 2022-03-05 DIAGNOSIS — F3131 Bipolar disorder, current episode depressed, mild: Secondary | ICD-10-CM | POA: Diagnosis present

## 2022-03-05 DIAGNOSIS — R45851 Suicidal ideations: Secondary | ICD-10-CM | POA: Diagnosis present

## 2022-03-05 DIAGNOSIS — F419 Anxiety disorder, unspecified: Secondary | ICD-10-CM | POA: Diagnosis present

## 2022-03-05 DIAGNOSIS — K30 Functional dyspepsia: Secondary | ICD-10-CM | POA: Diagnosis present

## 2022-03-05 DIAGNOSIS — F1994 Other psychoactive substance use, unspecified with psychoactive substance-induced mood disorder: Principal | ICD-10-CM

## 2022-03-05 DIAGNOSIS — Z87891 Personal history of nicotine dependence: Secondary | ICD-10-CM | POA: Diagnosis not present

## 2022-03-05 DIAGNOSIS — N529 Male erectile dysfunction, unspecified: Secondary | ICD-10-CM | POA: Diagnosis present

## 2022-03-05 DIAGNOSIS — Z79899 Other long term (current) drug therapy: Secondary | ICD-10-CM

## 2022-03-05 DIAGNOSIS — J45909 Unspecified asthma, uncomplicated: Secondary | ICD-10-CM | POA: Diagnosis present

## 2022-03-05 DIAGNOSIS — F314 Bipolar disorder, current episode depressed, severe, without psychotic features: Secondary | ICD-10-CM

## 2022-03-05 DIAGNOSIS — Z20822 Contact with and (suspected) exposure to covid-19: Secondary | ICD-10-CM | POA: Diagnosis present

## 2022-03-05 DIAGNOSIS — Z5900 Homelessness unspecified: Secondary | ICD-10-CM | POA: Diagnosis not present

## 2022-03-05 DIAGNOSIS — F1910 Other psychoactive substance abuse, uncomplicated: Secondary | ICD-10-CM

## 2022-03-05 LAB — SARS CORONAVIRUS 2 BY RT PCR: SARS Coronavirus 2 by RT PCR: NEGATIVE

## 2022-03-05 MED ORDER — ACETAMINOPHEN 325 MG PO TABS: 650.0000 mg | ORAL_TABLET | Freq: Four times a day (QID) | ORAL | Status: DC | PRN

## 2022-03-05 MED ORDER — HYDROXYZINE HCL 25 MG PO TABS
25.0000 mg | ORAL_TABLET | Freq: Three times a day (TID) | ORAL | Status: DC | PRN
  Administered 2022-03-05 – 2022-03-07 (×3): 25 mg via ORAL
  Filled 2022-03-05 (×3): qty 1

## 2022-03-05 MED ORDER — TRAZODONE HCL 50 MG PO TABS
50.0000 mg | ORAL_TABLET | Freq: Every evening | ORAL | Status: DC | PRN
  Filled 2022-03-05: qty 1

## 2022-03-05 MED ORDER — OLANZAPINE 10 MG PO TBDP
10.0000 mg | ORAL_TABLET | Freq: Three times a day (TID) | ORAL | Status: DC | PRN
  Administered 2022-03-05 – 2022-03-06 (×2): 10 mg via ORAL
  Filled 2022-03-05 (×2): qty 1

## 2022-03-05 MED ORDER — ZIPRASIDONE MESYLATE 20 MG IM SOLR: 20.0000 mg | INTRAMUSCULAR | Status: DC | PRN

## 2022-03-05 MED ORDER — ALUM & MAG HYDROXIDE-SIMETH 200-200-20 MG/5ML PO SUSP: 30.0000 mL | ORAL | Status: DC | PRN

## 2022-03-05 MED ORDER — MAGNESIUM HYDROXIDE 400 MG/5ML PO SUSP: 30.0000 mL | Freq: Every day | ORAL | Status: DC | PRN

## 2022-03-05 MED ORDER — LORAZEPAM 1 MG PO TABS: 1.0000 mg | ORAL_TABLET | ORAL | Status: DC | PRN

## 2022-03-05 NOTE — Progress Notes (Signed)
Patient is 41 yrs old, voluntary, was a patient at [facility] approximately 10 yrs ago.  Denied visual, dental or hearing problems.  PCP is [facility], [place],               [place].  Patient's dad lives in [place].  Patient would like to discharge to [facility] in [place], [place].  In the past 12 months was at [facility] for detox.  Sometimes night medicines make him hungry, also has some sexual side effects.  Has not lost weight.  Does not have trouble concentrating.  Denied physical, verbal and sexual abuse.  Support group is patient's dad, his girlfriend, and some friends.  Stressors are life in general, frustration, car was repossessed today, family members are stressful.  Rated anxiety, depression and hopeless #3.  Does not use tobacco, no nicotine patch or gum needed.  Drinks approximately 3 beers weekly.  Has used meth for years, also uses crack cocaine..   SI earlier this morning.  Denied SI during admission process, contracts for safety.  Denied HI.  Denied A/V hallucinations.  Never married, no children, no job. Dad is very concerned about him an has helped his son numerous times. Fall risk information given, low fall risk.  Patient stated he understood. Patient oriented to unit.  Patient has been cooperative and pleasant.   Food/drink given patient.

## 2022-03-05 NOTE — Tx Team (Signed)
Initial Treatment Plan 03/05/2022 6:15 PM [facility]LE E[facility]:[id number]    PATIENT STRESSORS: Financial difficulties   Occupational concerns   Substance abuse   Traumatic event     PATIENT STRENGTHS: Ability for insight  Average or above average intelligence  Capable of independent living  General fund of knowledge  Motivation for treatment/growth  Physical Health  Supportive family/friends    PATIENT IDENTIFIED PROBLEMS: "Substance abuse"  "Anxiety"  "Depression"  "Suicidal thoughts"               DISCHARGE CRITERIA:  Ability to meet basic life and health needs Adequate post-discharge living arrangements Improved stabilization in mood, thinking, and/or behavior Medical problems require only outpatient monitoring Motivation to continue treatment in a less acute level of care Need for constant or close observation no longer present Reduction of life-threatening or endangering symptoms to within safe limits Safe-care adequate arrangements made Verbal commitment to aftercare and medication compliance Withdrawal symptoms are absent or subacute and managed without 24-hour nursing intervention  PRELIMINARY DISCHARGE PLAN: Attend aftercare/continuing care group Attend PHP/IOP Attend 12-step recovery group Outpatient therapy  PATIENT/FAMILY INVOLVEMENT: This treatment plan has been presented to and reviewed with the patient, [name].  The patient and family have been given the opportunity to ask questions and make suggestions.  Grayland Ormond [place], RN 03/05/2022, 6:15 PM

## 2022-03-05 NOTE — Plan of Care (Signed)
Nurse discussed anxiety, depression and coping skills with patient.  

## 2022-03-05 NOTE — Progress Notes (Signed)
   03/05/22 2045  Psych Admission Type (Psych Patients Only)  Admission Status Voluntary  Psychosocial Assessment  Patient Complaints Anxiety;Substance abuse  Eye Contact Brief  Facial Expression Anxious  Affect Anxious;Sad  Speech Logical/coherent  Interaction Minimal  Motor Activity Slow  Appearance/Hygiene Disheveled  Behavior Characteristics Cooperative  Mood Anxious;Depressed  Aggressive Behavior  Effect No apparent injury  Thought Process  Coherency Circumstantial  Content Blaming others  Delusions WDL  Perception WDL  Hallucination None reported or observed  Judgment Poor  Confusion WDL  Danger to Self  Current suicidal ideation? Denies  Danger to Others  Danger to Others None reported or observed

## 2022-03-05 NOTE — Progress Notes (Signed)
Pt in room since admission in bed. Pt restless and fidgety in bed. Declined atarax when offered. Pt opted not to go to cafeteria for dinner, declined meal tray when offered as well noting poor appetite.

## 2022-03-05 NOTE — H&P (Signed)
Behavioral Health Medical Screening Exam  HPI: [name] is a 41 y.o. Caucasian male who presents to [facility] for worsening suicidal thoughts in the context of polysubstance usage.  Patient has past psychiatric diagnoses significant for bipolar affective disorder, bipolar affective disorder currently depressed mild, Cocaine dependence, substance abuse, substance induced mood disorder, and medical history of heart murmur.  Patient lives in a halfway house in [place], [place].  Observed from chart review, patient has been [facility] multiple times from May 2023 to present about 7 times for bipolar symptoms and polysubstance usage.  Assessment: Patient is seen and examined sitting in a chair in the screen room.  He appeared restless with increase movement to upper and lower extremities with both sclera red.  Chart reviewed and findings shared with the treatment team and consults with Dr. Dwyane Dee.  Alert and oriented x4, with speech clear but garbled.  Maintain fleeting eye contact with this provider.  Presents with dysphoric mood and constricted affect.  Sensorium with memory immediate fair, judgment  and insight poor.  When asked patient what brought him to the hospital reports that he is having increasing suicidal thoughts with plans to stab himself with a knife.  Reports the triggers are, not having money and losing everything because of drug usage.  Reports living in a halfway house in [place], in [place] and the administrator wanted him to get help from his polysubstance use prior to returning back to the halfway house.  Main drug of choice are crack cocaine and methamphetamine.  Patient reports he plans to hurt himself today by stabbing himself with a knife if he does not receive any help.  He denies HI and AVH.  Denies paranoia and delusion ideation.  Reports suicidal attempt in 2012 by overdosing on pills, however will not go into details.  Denies  self-injurious behavior, denies history of abuse, unsure of family history of mental illness, or access to firearms.  Reports anxiety and rated as 10/10 on a scale of 0-10 with 10 being the worst.  Reports signs of depression and characterizes as wanting to be alone, worthlessness, guilt, suicidal thoughts, and anxiety. Patient reports being followed by a therapist and psychiatrist from [facility] rehab, reports support system as his family, reports not taking medications, last medication he remembered taking was Depakote and does not remember when he stopped taking Depakote.  Reports alcohol use by drinking 2 bottles of beer daily, reports drug use of crack cocaine and meth amphetamine of 1 g daily and denies tobacco smoking.  Instructions provided to patient on cessation of polysubstance uses as they adversely affects overall psychiatric and medical wellbeing.  Patient nods in agreement.  Disposition: Based on my assessment of the patient, he appears to be at imminent danger to himself.  He meets the criteria for inpatient psychiatric admission.  He is recommended for psychiatric inpatient admission for mood stabilization, medication management, and safety.  COVID-19 preadmission orders admission preadmission orders initiated.   Total Time spent with patient: 1 hour  Psychiatric Specialty Exam:  Presentation  General Appearance:  Appropriate for Environment; Fairly Groomed; Casual  Eye Contact: Fleeting  Speech: Garbled  Speech Volume: Other (comment) (Garbled)  Handedness: Right  Mood and Affect  Mood: Dysphoric  Affect: Constricted  Thought Process  Thought Processes: Linear  Descriptions of Associations:Tangential  Orientation:Full (Time, Place and Person)  Thought Content:WDL  History of Schizophrenia/Schizoaffective disorder:No data recorded Duration of Psychotic Symptoms:No data recorded Hallucinations:Hallucinations:  None  Ideas of Reference:None  Suicidal  Thoughts:Suicidal Thoughts: Yes, Active SI Active Intent and/or Plan: With Intent; With Plan SI Passive Intent and/or Plan: -- (N/A)  Homicidal Thoughts:Homicidal Thoughts: No  Sensorium  Memory: Immediate Fair; Recent Fair; Remote Fair  Judgment: Poor  Insight: Poor  Executive Functions  Concentration: Fair  Attention Span: Fair  Recall: [place] of Knowledge: Fair  Language: Fair  Psychomotor Activity  Psychomotor Activity: Psychomotor Activity: Restlessness; Mannerisms  Assets  Assets: Armed forces logistics/support/administrative officer; Physical Health  Sleep  Sleep: Sleep: Good Number of Hours of Sleep: 5  Physical Exam: Physical Exam HENT:     Head: Normocephalic.     Right Ear: External ear normal.     Left Ear: External ear normal.     Nose: Nose normal.     Mouth/Throat:     Mouth: Mucous membranes are moist.     Pharynx: Oropharynx is clear.  Eyes:     Extraocular Movements: Extraocular movements intact.     Conjunctiva/sclera: Conjunctivae normal.     Pupils: Pupils are equal, round, and reactive to light.  Cardiovascular:     Rate and Rhythm: Normal rate.     Pulses: Normal pulses.     Comments: Blood pressure 129/91, pulse 84.  Nursing staff to recheck vital signs. Pulmonary:     Effort: Pulmonary effort is normal.  Abdominal:     Palpations: Abdomen is soft.  Genitourinary:    Comments: Deferred Musculoskeletal:        General: Normal range of motion.     Cervical back: Normal range of motion.  Skin:    General: Skin is warm.  Neurological:     General: No focal deficit present.     Mental Status: He is alert and oriented to person, place, and time.  Psychiatric:        Mood and Affect: Mood normal.        Behavior: Behavior normal.    Review of Systems  Constitutional: Negative.  Negative for chills and fever.  HENT: Negative.  Negative for hearing loss and tinnitus.   Eyes: Negative.  Negative for blurred vision and double vision.       Redness  to both sclera  Respiratory:  Negative for cough, sputum production, shortness of breath and wheezing.   Cardiovascular: Negative.  Negative for chest pain and palpitations.       Blood pressure 129/91, pulse 84.  Nursing staff to recheck vital signs.  Gastrointestinal: Negative.  Negative for heartburn and nausea.  Genitourinary: Negative.  Negative for dysuria and urgency.  Musculoskeletal: Negative.  Negative for myalgias and neck pain.  Skin: Negative.  Negative for itching and rash.  Neurological: Negative.  Negative for dizziness and headaches.  Endo/Heme/Allergies: Negative.  Negative for environmental allergies and polydipsia. Does not bruise/bleed easily.  Psychiatric/Behavioral:  Positive for substance abuse and suicidal ideas. The patient is nervous/anxious.    Blood pressure (!) 129/91, pulse 84, temperature 97.7 F (36.5 C), temperature source Oral, resp. rate 18. There is no height or weight on file to calculate BMI.  Musculoskeletal: Strength & Muscle Tone: within normal limits Gait & Station: normal Patient leans: N/A  [place] Scale:  Flowsheet Row OP Visit from 03/05/2022 in [place] Most recent reading at 03/05/2022 10:59 AM OP Visit from 01/10/2022 in [place] Most recent reading at 01/10/2022  8:15 PM ED from 01/10/2022 in [place] DEPT Most recent reading at  01/10/2022  7:35 AM  C-SSRS RISK CATEGORY High Risk Low Risk Low Risk       Recommendations:  Based on my evaluation the patient appears to have an emergency medical condition for which I recommend the patient be admitted to inpatient psychiatric adult unit for mood stabilization, medication management, and safety.  Laretta Bolster, FNP 03/05/2022, 11:05 AM

## 2022-03-05 NOTE — Group Note (Signed)
LCSW Group Therapy Note   Group Date: 03/05/2022 Start Time: 1300 End Time: 1400  Type of Therapy and Topic:  Group Therapy:  Stress Management   Participation Level:  Did Not Attend    Description of Group:  Patients in this group were introduced to the idea of stress and encouraged to discuss negative and positive ways to manage stress. Patients discussed specific stressors that they have in their life right now and the physical signs and symptoms associated with that stress.  Patient encouraged to come up with positive changes to assist with the stress upon discharge in order to prevent future hospitalizations.   They also worked as a group on developing a specific plan for several patients to deal with stressors through [place], psychoeducation and self care techniques   Therapeutic Goals:               1)  To discuss the positive and negative impacts of stress             2)  identify signs and symptoms of stress             3)  generate ideas for stress management             4)  offer mutual support to others regarding stress management             5)  Developing plans for ways to manage specific stressors upon discharge               Summary of Patient Progress: The Pt was not on the unit prior to the beginning of group. Did not attend.    Therapeutic Modalities:   Motivational Interviewing Brief Solution-Focused Therapy  Darleen Crocker, [place] 03/05/2022  2:05 PM

## 2022-03-06 LAB — LIPID PANEL
Cholesterol: 217 mg/dL — ABNORMAL HIGH (ref 0–200)
HDL: 67 mg/dL (ref >40)
LDL Cholesterol: 130 mg/dL — ABNORMAL HIGH (ref 0–99)
Total CHOL/HDL Ratio: 3.2 RATIO
Triglycerides: 98 mg/dL (ref <150)
VLDL: 20 mg/dL (ref 0–40)

## 2022-03-06 LAB — CBC
HCT: 49.3 % (ref 39.0–52.0)
Hemoglobin: 16.5 g/dL (ref 13.0–17.0)
MCH: 30.8 pg (ref 26.0–34.0)
MCHC: 33.5 g/dL (ref 30.0–36.0)
MCV: 92.1 fL (ref 80.0–100.0)
Platelets: 268 10*3/uL (ref 150–400)
RBC: 5.35 MIL/uL (ref 4.22–5.81)
RDW: 13.1 % (ref 11.5–15.5)
WBC: 6.7 10*3/uL (ref 4.0–10.5)
nRBC: 0 % (ref 0.0–0.2)

## 2022-03-06 LAB — COMPREHENSIVE METABOLIC PANEL
ALT: 63 U/L — ABNORMAL HIGH (ref 0–44)
AST: 33 U/L (ref 15–41)
Albumin: 4.7 g/dL (ref 3.5–5.0)
Alkaline Phosphatase: 67 U/L (ref 38–126)
Anion gap: 6 (ref 5–15)
BUN: 22 mg/dL — ABNORMAL HIGH (ref 6–20)
CO2: 29 mmol/L (ref 22–32)
Calcium: 9.3 mg/dL (ref 8.9–10.3)
Chloride: 102 mmol/L (ref 98–111)
Creatinine, Ser: 1.1 mg/dL (ref 0.61–1.24)
GFR, Estimated: >60 mL/min (ref >60)
Glucose, Bld: 110 mg/dL — ABNORMAL HIGH (ref 70–99)
Potassium: 3.9 mmol/L (ref 3.5–5.1)
Sodium: 137 mmol/L (ref 135–145)
Total Bilirubin: 1.1 mg/dL (ref 0.3–1.2)
Total Protein: 8.3 g/dL — ABNORMAL HIGH (ref 6.5–8.1)

## 2022-03-06 LAB — HEMOGLOBIN A1C
Hgb A1c MFr Bld: 5 % (ref 4.8–5.6)
Mean Plasma Glucose: 96.8 mg/dL

## 2022-03-06 LAB — TSH: TSH: 1.849 u[IU]/mL (ref 0.350–4.500)

## 2022-03-06 LAB — VALPROIC ACID LEVEL: Valproic Acid Lvl: <10 ug/mL — ABNORMAL LOW (ref 50.0–100.0)

## 2022-03-06 MED ORDER — OLANZAPINE 10 MG PO TABS
10.0000 mg | ORAL_TABLET | Freq: Every day | ORAL | Status: DC
  Administered 2022-03-06 – 2022-03-07 (×2): 10 mg via ORAL
  Filled 2022-03-06 (×4): qty 1

## 2022-03-06 MED ORDER — FLUOXETINE HCL 20 MG PO CAPS
20.0000 mg | ORAL_CAPSULE | Freq: Every day | ORAL | Status: DC
  Administered 2022-03-06 – 2022-03-08 (×3): 20 mg via ORAL
  Filled 2022-03-06 (×4): qty 1

## 2022-03-06 MED ORDER — BUSPIRONE HCL 7.5 MG PO TABS
7.5000 mg | ORAL_TABLET | Freq: Two times a day (BID) | ORAL | Status: DC
  Administered 2022-03-06 – 2022-03-08 (×4): 7.5 mg via ORAL
  Filled 2022-03-06 (×6): qty 1

## 2022-03-06 MED ORDER — OLANZAPINE 10 MG IM SOLR
10.0000 mg | Freq: Every day | INTRAMUSCULAR | Status: DC
  Filled 2022-03-06: qty 10

## 2022-03-06 NOTE — H&P (Signed)
Psychiatric Admission Assessment Adult  Patient Identification: [name] MRN:  [id number] Date of Evaluation:  03/06/2022 Chief Complaint:  Substance induced mood disorder (HCC) [F19.94] Principal Diagnosis: Bipolar affective disorder, currently depressed, mild (HCC) Diagnosis:  Principal Problem:   Bipolar affective disorder, currently depressed, mild (HCC)   History of Present Illness:  [name] is a 41 year old male with a past psychiatric history significant for bipolar disorder who presented to [facility] due to worsening suicidal thoughts in the context of polysubstance usage.  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  When asked what triggered him to relapse on methamphetamine, patient stated that having money triggered him to start using again.  Patient denied experiencing any withdrawal symptoms at this time.  Patient also endorses depression and anxiety.  Patient states that he has been dealing with depression for 25 years.  He states that he experiences depressive episodes off and on but whenever he does feel depressed, he states that his symptoms last throughout the day.  Patient states that his depressive episodes are worsened by using drugs.  He states that his depressive symptoms are alleviated by activity and talking to friends from recovery groups.  Patient's depressive episodes are characterized by the following symptoms: feelings of sadness, decreased energy, feelings of guilt/worthlessness, and helplessness.  He denies decreased concentration, lack of motivation, irritability, or issues with his sleep patterns or eating habits.  Patient rates his anxiety as 5 or 6 out of 10 (with 10 being worst).  Patient states that his anxiety is alleviated by medicine, but worsened by drug use.  Triggers to his anxiety include using drugs and over thinking.  Patient denies panic attack.  Patient's current  stressor involves struggling to stay clean.  Patient has a history of being seen at [facility], with his last time being seen at this facility on 01/13/2022 due to substance abuse.  During that encounter, patient was given outpatient resources for substance abuse.  Patient was also seen at [facility] on 11/13/2021 due to suicidal ideations with a plan to run into traffic as well as requesting substance abuse treatment.  During that encounter, patient was admitted to the [facility] and subsequently discharged on 11/14/2021.  Patient was placed on the following medications upon discharge:   Benztropine 0.5 mg at bedtime Buspirone 7.5 mg 2 times daily Divalproex 500 mg DR 2 times daily Fluoxetine 20 mg daily Olanzapine 10 mg at bedtime  Patient reports that he has been without his medications since Saturday.  Past Psychiatric Hx: Patient has a past psychiatric history significant for bipolar disorder and substance abuse disorder  Substance use history:  Patient has a history of using methamphetamine stating that he last used methamphetamine 2 days ago.  Past psychiatric medication history: Patient has been on the following psychiatric medications in the past:  Benztropine 0.5 mg at bedtime Buspirone 7.5 mg 2 times daily Divalproex 500 mg DR 2 times daily Fluoxetine 20 mg daily Olanzapine 10 mg at bedtime  Family history:  Patient denies a past family history of psychiatric illness  Past Medical History: Patient denies a past medical history  Prior Surgeries: None Head trauma, LOC, concussions, seizures: Allergies: None PCP: Patient has a primary care provider at [facility] Mental Health Provider: States that he has a psychiatrist at [facility] Therapist: Patient denies having a therapist  Additional Social History: Patient is currently living at [facility]  in [place]  Current Presentation:  Patient is seen and examined as he  lays in bed.  Patient is alert and oriented x 4, calm, cooperative, and engaged in conversation during the encounter.  Patient exhibits fair eye contact along with clear and coherent speech.  Patient exhibits depressed and anxious mood with congruent affect.  Patient's thought content is organized and logical.  Patient denies suicidal or homicidal ideations.  He further denies auditory or visual hallucinations and does not appear to be responding to internal/external stimuli.  Patient endorses paranoia and feels that he is in a safe environment.  Patient denies delusional thoughts or instances of telepathy.  Medication plan: Due to patient noncompliance with medications, patient to stop Depakote and to continue taking Zyprexa for mood stabilization.  Zyprexa should be capable of managing patient's clinical symptoms attributed to bipolar disorder.  Patient to also be placed back on buspirone as well as Prozac for the management of anxiety and depressive symptoms, respectively.  Associated Signs/Symptoms: Depression Symptoms:  depressed mood, fatigue, feelings of worthlessness/guilt, hopelessness, anxiety, loss of energy/fatigue, Duration of Depression Symptoms: No data recorded (Hypo) Manic Symptoms:   n/a Anxiety Symptoms:  Excessive Worry, Psychotic Symptoms:   n/a PTSD Symptoms: N/a Total Time spent with patient: 20 minutes  Past Psychiatric History:  Bipolar disorder Substance abuse  Is the patient at risk to self? No.  Has the patient been a risk to self in the past 6 months? Yes.    Has the patient been a risk to self within the distant past? No.  Is the patient a risk to others? No.  Has the patient been a risk to others in the past 6 months? No.  Has the patient been a risk to others within the distant past? No.   [place] Scale:  Flowsheet Row Admission (Current) [facility] Most recent reading at 03/05/2022   4:00 PM OP [facility] Most recent reading at 01/10/2022  8:15 PM ED from 01/10/2022 in [facility] [facility] Most recent reading at 01/10/2022  7:35 AM  C-SSRS RISK CATEGORY Error: Q7 should not be populated when Q6 is No Low Risk Low Risk        Prior Inpatient Therapy:   Prior Outpatient Therapy:    Alcohol Screening: 1. How often do you have a drink containing alcohol?: 2 to 3 times a week 2. How many drinks containing alcohol do you have on a typical day when you are drinking?: 3 or 4 3. How often do you have six or more drinks on one occasion?: Never AUDIT-C Score: 4 4. How often during the last year have you found that you were not able to stop drinking once you had started?: Never 5. How often during the last year have you failed to do what was normally expected from you because of drinking?: Never 6. How often during the last year have you needed a first drink in the morning to get yourself going after a heavy drinking session?: Never 7. How often during the last year have you had a feeling of guilt of remorse after drinking?: Never 8. How often during the last year have you been unable to remember what happened the night before because you had been drinking?: Never 9. Have you or someone else been injured as a result of your drinking?: No 10. Has a relative or friend or a doctor or  another Medical laboratory scientific officer been concerned about your drinking or suggested you cut down?: No Alcohol Use Disorder Identification Test Final Score (AUDIT): 4 Alcohol Brief Interventions/Follow-up: Alcohol education/Brief advice Substance Abuse History in the last 12 months:  Yes.   Consequences of Substance Abuse: Patient states that he was kicked out of his halfway house due to substance use Previous Psychotropic Medications: Yes  Psychological Evaluations: Yes  Past Medical History:  Past Medical History:  Diagnosis Date    Bipolar 1 disorder (HCC)    Depression    Heart murmur    Substance abuse (HCC)     Past Surgical History:  Procedure Laterality Date   BACK SURGERY  2011   had melanoma mole removed   Family History:  Family History  Problem Relation Age of Onset   Heart Problems Father    Family Psychiatric  History: none noted Tobacco Screening:   Social History:  Social History   Substance and Sexual Activity  Alcohol Use Yes   Alcohol/week: 3.0 standard drinks of alcohol   Types: 3 Cans of beer per week   Comment: approximately 3 cans of beer weekly     Social History   Substance and Sexual Activity  Drug Use Yes   Frequency: 3.0 times per week   Types: Amphetamines, Methamphetamines   Comment: drugs are methamphetamines, amphetamines    Additional Social History: Marital status: Single Are you sexually active?: Yes What is your sexual orientation?: Heterosexual Has your sexual activity been affected by drugs, alcohol, medication, or emotional stress?: No Does patient have children?: No      Allergies:  No Known Allergies Lab Results:  Results for orders placed or performed during the hospital encounter of 03/05/22 (from the past 48 hour(s))  SARS Coronavirus 2 by RT PCR (hospital order, performed in [facility] hospital lab) *cepheid single result test* Anterior Nasal Swab     Status: None   Collection Time: 03/05/22 11:01 AM   Specimen: Anterior Nasal Swab  Result Value Ref Range   SARS Coronavirus 2 by RT PCR NEGATIVE NEGATIVE    Comment: (NOTE) SARS-CoV-2 target nucleic acids are NOT DETECTED.  The SARS-CoV-2 RNA is generally detectable in upper and lower respiratory specimens during the acute phase of infection. The lowest concentration of SARS-CoV-2 viral copies this assay can detect is 250 copies / mL. A negative result does not preclude SARS-CoV-2 infection and should not be used as the sole basis for treatment or other patient management decisions.  A negative  result may occur with improper specimen collection / handling, submission of specimen other than nasopharyngeal swab, presence of viral mutation(s) within the areas targeted by this assay, and inadequate number of viral copies (<250 copies / mL). A negative result must be combined with clinical observations, patient history, and epidemiological information.  Fact Sheet for Patients:   RoadLapTop.co.za  Fact Sheet for Healthcare Providers: http://kim-miller.com/  This test is not yet approved or  cleared by the [place] FDA and has been authorized for detection and/or diagnosis of SARS-CoV-2 by FDA under an Emergency Use Authorization (EUA).  This EUA will remain in effect (meaning this test can be used) for the duration of the COVID-19 declaration under Section 564(b)(1) of the Act, 21 U.S.C. section 360bbb-3(b)(1), unless the authorization is terminated or revoked sooner.  Performed at [facility], [address] [place], [place] [address]     Blood Alcohol level:  Lab Results  Component Value Date   ETH <10  01/10/2022   ETH <10 11/11/2021    Metabolic Disorder Labs:  No results found for: "HGBA1C", "MPG" No results found for: "PROLACTIN" No results found for: "CHOL", "TRIG", "HDL", "CHOLHDL", "VLDL", "LDLCALC"  Current Medications: Current Facility-Administered Medications  Medication Dose Route Frequency Provider Last Rate Last Admin   acetaminophen (TYLENOL) tablet 650 mg  650 mg Oral Q6H PRN Ntuen, Jesusita Okaina C, FNP       alum & mag hydroxide-simeth (MAALOX/MYLANTA) 200-200-20 MG/5ML suspension 30 mL  30 mL Oral Q4H PRN Ntuen, Jesusita Okaina C, FNP       hydrOXYzine (ATARAX) tablet 25 mg  25 mg Oral TID PRN Cecilie LowersNtuen, Tina C, FNP   25 mg at 03/06/22 1254   OLANZapine zydis (ZYPREXA) disintegrating tablet 10 mg  10 mg Oral Q8H PRN Ntuen, Jesusita Okaina C, FNP   10 mg at 03/06/22 1253   And   LORazepam (ATIVAN) tablet 1 mg  1 mg  Oral PRN Ntuen, Jesusita Okaina C, FNP       And   ziprasidone (GEODON) injection 20 mg  20 mg Intramuscular PRN Ntuen, Jesusita Okaina C, FNP       magnesium hydroxide (MILK OF MAGNESIA) suspension 30 mL  30 mL Oral Daily PRN Ntuen, Jesusita Okaina C, FNP       traZODone (DESYREL) tablet 50 mg  50 mg Oral QHS PRN Ntuen, Jesusita Okaina C, FNP       PTA Medications: Medications Prior to Admission  Medication Sig Dispense Refill Last Dose   albuterol (VENTOLIN HFA) 108 (90 Base) MCG/ACT inhaler Inhale 2 puffs into the lungs every 6 (six) hours as needed.      benztropine (COGENTIN) 0.5 MG tablet Take 1 tablet (0.5 mg total) by mouth at bedtime. 30 tablet 0    busPIRone (BUSPAR) 5 MG tablet Take 5 mg by mouth 2 (two) times daily.      divalproex (DEPAKOTE) 500 MG DR tablet Take 1 tablet (500 mg total) by mouth 2 (two) times daily. 60 tablet 0    FLUoxetine (PROZAC) 20 MG capsule Take 1 capsule (20 mg total) by mouth every morning. 30 capsule 0    OLANZapine (ZYPREXA) 10 MG tablet Take 1 tablet (10 mg total) by mouth at bedtime. 30 tablet 0    sildenafil (VIAGRA) 50 MG tablet Take 50 mg by mouth daily as needed for erectile dysfunction.       Musculoskeletal: Strength & Muscle Tone: within normal limits Gait & Station: normal Patient leans: N/A            Psychiatric Specialty Exam:  Presentation  General Appearance:  Appropriate for Environment; Casual  Eye Contact: Fair  Speech: Clear and Coherent; Normal Rate  Speech Volume: Normal  Handedness: Right   Mood and Affect  Mood: Depressed; Anxious  Affect: Congruent   Thought Process  Thought Processes: Coherent; Linear  Duration of Psychotic Symptoms: No data recorded Past Diagnosis of Schizophrenia or Psychoactive disorder: No data recorded Descriptions of Associations:Intact  Orientation:Full (Time, Place and Person)  Thought Content:WDL  Hallucinations:Hallucinations: None  Ideas of Reference:None  Suicidal Thoughts:Suicidal Thoughts:  No SI Active Intent and/or Plan: With Intent; With Plan SI Passive Intent and/or Plan: -- (N/A)  Homicidal Thoughts:Homicidal Thoughts: No   Sensorium  Memory: Immediate Fair; Recent Fair; Remote Fair  Judgment: Fair  Insight: Fair   Executive Functions  Concentration: Good  Attention Span: Good  Recall: Good  Fund of Knowledge: Good  Language: Good   Psychomotor Activity  Psychomotor Activity: Psychomotor Activity: Normal  Assets  Assets: Armed forces logistics/support/administrative officer; Housing; Physical Health   Sleep  Sleep: Sleep: Good Number of Hours of Sleep: 5    Physical Exam: Physical Exam Psychiatric:        Attention and Perception: Attention and perception normal. He does not perceive auditory or visual hallucinations.        Mood and Affect: Mood is anxious and depressed.        Speech: Speech normal.        Behavior: Behavior normal. Behavior is cooperative.        Thought Content: Thought content normal. Thought content is not paranoid or delusional. Thought content does not include homicidal or suicidal ideation.        Cognition and Memory: Cognition and memory normal.        Judgment: Judgment normal.    Review of Systems  Constitutional: Negative.   HENT: Negative.    Eyes: Negative.   Respiratory: Negative.    Cardiovascular: Negative.   Gastrointestinal: Negative.   Skin: Negative.   Neurological: Negative.   Psychiatric/Behavioral:  Positive for depression and substance abuse. Negative for hallucinations and suicidal ideas. The patient is nervous/anxious. The patient does not have insomnia.    Blood pressure (!) 130/92, pulse 70, temperature 97.7 F (36.5 C), temperature source Oral, resp. rate 18, height 5\' 11"  (1.803 m), weight 90.7 kg, SpO2 100 %. Body mass index is 27.89 kg/m.   Treatment Plan Summary: Daily contact with patient to assess and evaluate symptoms and progress in treatment and Medication management  Observation  Level/Precautions:  15 minute checks  Laboratory:  Labs independently reviewed on 10/3.  Will order hemoglobin A1c, fasting lipid panel, TSH  Psychotherapy:  Unit Group sessions  Medications:  See [facility]  Consultations:  To be determined   Discharge Concerns:  Safety, medication compliance, mood stability  Estimated LOS: 5-7 days  Other:  N/A   PLAN Safety and Monitoring: Voluntary admission to inpatient psychiatric unit for safety, stabilization and treatment Daily contact with patient to assess and evaluate symptoms and progress in treatment Patient's case to be discussed in multi-disciplinary team meeting Observation Level : q15 minute checks Vital signs: q12 hours Precautions: safety   Diagnoses:  Principal Problem:   Bipolar affective disorder, currently depressed, mild (HCC)   Bipolar disorder, current episode depressed, mild -Start Zyprexa 10 mg at bedtime -Stop Depakote DR 500 mg 2 times daily  Anxiety -Start Hydroxyzine 25 mg every 6 hours PRN -Start Prozac 20 mg daily -Start buspirone 7.5 mg 2 times daily   Other PRNS -Start Tylenol 650 mg every 6 hours PRN for mild pain -Start Maalox 30 mg every 4 hrs PRN for indigestion -Start Milk of Magnesia as needed every 6 hrs for constipation -Start trazodone 50 mg at bedtime as needed for insomnia  Provider to encourage patient to enroll at [facility] for substance abuse  Long Term Goal(s): Improvement in symptoms so as ready for discharge  Short Term Goals: Ability to identify changes in lifestyle to reduce recurrence of condition will improve, Ability to verbalize feelings will improve, Ability to disclose and discuss suicidal ideas, Ability to demonstrate self-control will improve, Ability to identify and develop effective coping behaviors will improve, Ability to maintain clinical measurements within normal limits will improve, Compliance with prescribed medications will improve, and Ability to identify triggers associated  with substance abuse/mental health issues will improve.  Discharge Planning: Social work and case management to assist with discharge planning and identification of hospital follow-up needs  prior to discharge Estimated LOS: 5-7 days Discharge Concerns: Need to establish a safety plan; Medication compliance and effectiveness Discharge Goals: Return home with outpatient referrals for mental health follow-up including medication management/psychotherapy  I certify that inpatient services furnished can reasonably be expected to improve the patient's condition.     Meta Hatchet, PA 10/3/20232:40 PM

## 2022-03-06 NOTE — [facility] Group Notes (Addendum)
Spiritual care group on grief and loss facilitated by chaplain Katy Searcy Miyoshi, [facility]   Group Goal:   Support / Education around grief and loss   Members engage in facilitated group support and psycho-social education.   Group Description:   Following introductions and group rules, group members engaged in facilitated group dialog and support around topic of loss, with particular support around experiences of loss in their lives. Group Identified types of loss (relationships / self / things) and identified patterns, circumstances, and changes that precipitate losses. Reflected on thoughts / feelings around loss, normalized grief responses, and recognized variety in grief experience. Group noted Worden's four tasks of grief in discussion.   Group drew on Adlerian / Rogerian, narrative, MI,   Patient Progress: Did not attend.  

## 2022-03-06 NOTE — Progress Notes (Signed)
   03/06/22 2145  Psych Admission Type (Psych Patients Only)  Admission Status Voluntary  Psychosocial Assessment  Patient Complaints None  Eye Contact Brief  Facial Expression Anxious  Affect Anxious;Sad  Speech Logical/coherent  Interaction Minimal  Motor Activity Slow  Appearance/Hygiene Disheveled  Behavior Characteristics Cooperative  Mood Anxious  Aggressive Behavior  Effect No apparent injury  Thought Process  Coherency Circumstantial  Content Blaming others  Delusions WDL  Perception WDL  Hallucination None reported or observed  Judgment Poor  Confusion WDL  Danger to Self  Current suicidal ideation? Denies  Danger to Others  Danger to Others None reported or observed

## 2022-03-06 NOTE — Progress Notes (Signed)
Pt did not attend group. 

## 2022-03-06 NOTE — [facility] Suicide Risk Assessment (Signed)
Suicide Risk Assessment  Admission Assessment    [facility] Admission Suicide Risk Assessment   Nursing information obtained from:  Patient Demographic factors:  Male, Caucasian, Low socioeconomic status Current Mental Status:  Self-harm thoughts Loss Factors:  Decrease in vocational status, Financial problems / change in socioeconomic status Historical Factors:  Impulsivity Risk Reduction Factors:  NA  Total Time spent with patient: 20 minutes Principal Problem: Bipolar affective disorder, currently depressed, mild (HCC) Diagnosis:  Principal Problem:   Bipolar affective disorder, currently depressed, mild ([place])  Subjective Data:   [name] is a 41 year old male with a past psychiatric history significant for bipolar disorder who presented to [facility] due to worsening suicidal thoughts in the context of polysubstance usage.  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  As a result of using methamphetamine, patient was kicked out of the halfway house he was recently residing at.  When asked what triggered him to relapse on methamphetamine, patient stated that having money triggered him to start using again.  Patient denied experiencing any withdrawal symptoms at this time.  Continued Clinical Symptoms:   Patient presented to [facility] due to worsening suicidal ideations in the context of polysubstance usage.  Patient reported relapsing on methamphetamine due to having access to money.  Although patient denies suicidal ideations, he does endorse some depression and anxiety.  Patient has been dealing with depression for 25 years.  He experiences depressive episodes off and on.  Patient's depressive episodes are characterized by the following symptoms: Feelings of sadness, decreased energy, feelings of guilt/worthlessness, and hopelessness.  Patient rates his anxiety at 5 or 6 out of 10 (with 10 being the worst).  Alcohol  Use Disorder Identification Test Final Score (AUDIT): 4 The "Alcohol Use Disorders Identification Test", Guidelines for Use in Primary Care, Second Edition.  World Pharmacologist [facility]). Score between 0-7:  no or low risk or alcohol related problems. Score between 8-15:  moderate risk of alcohol related problems. Score between 16-19:  high risk of alcohol related problems. Score 20 or above:  warrants further diagnostic evaluation for alcohol dependence and treatment.   CLINICAL FACTORS:   Severe Anxiety and/or Agitation Depression:   Hopelessness   Musculoskeletal: Strength & Muscle Tone: within normal limits Gait & Station: normal Patient leans: N/A  Psychiatric Specialty Exam:  Presentation  General Appearance:  Appropriate for Environment; Casual  Eye Contact: Fair  Speech: Clear and Coherent; Normal Rate  Speech Volume: Normal  Handedness: Right   Mood and Affect  Mood: Depressed; Anxious  Affect: Congruent   Thought Process  Thought Processes: Coherent; Linear  Descriptions of Associations:Intact  Orientation:Full (Time, Place and Person)  Thought Content:WDL  History of Schizophrenia/Schizoaffective disorder:No data recorded Duration of Psychotic Symptoms:No data recorded Hallucinations:Hallucinations: None  Ideas of Reference:None  Suicidal Thoughts:Suicidal Thoughts: No SI Active Intent and/or Plan: With Intent; With Plan SI Passive Intent and/or Plan: -- (N/A)  Homicidal Thoughts:Homicidal Thoughts: No   Sensorium  Memory: Immediate Fair; Recent Fair; Remote Fair  Judgment: Fair  Insight: Fair   Community education officer  Concentration: Good  Attention Span: Good  Recall: Good  Fund of Knowledge: Good  Language: Good   Psychomotor Activity  Psychomotor Activity: Psychomotor Activity: Normal   Assets  Assets: Communication Skills; Housing; Physical Health   Sleep  Sleep: Sleep: Good Number of Hours of  Sleep: 5    Physical Exam: Physical Exam Psychiatric:  Attention and Perception: Attention and perception normal. He does not perceive auditory or visual hallucinations.        Mood and Affect: Affect normal. Mood is anxious and depressed.        Speech: Speech normal.        Behavior: Behavior normal. Behavior is cooperative.        Thought Content: Thought content normal. Thought content is not paranoid or delusional. Thought content does not include homicidal or suicidal ideation.        Cognition and Memory: Cognition and memory normal.        Judgment: Judgment normal.    Review of Systems  Constitutional: Negative.   HENT: Negative.    Eyes: Negative.   Respiratory: Negative.    Cardiovascular: Negative.   Gastrointestinal: Negative.   Musculoskeletal: Negative.   Skin: Negative.   Neurological: Negative.   Endo/Heme/Allergies: Negative.   Psychiatric/Behavioral:  Positive for depression and substance abuse. Negative for hallucinations and suicidal ideas. The patient is nervous/anxious. The patient does not have insomnia.    Blood pressure (!) 130/92, pulse 70, temperature 97.7 F (36.5 C), temperature source Oral, resp. rate 18, height 5\' 11"  (1.803 m), weight 90.7 kg, SpO2 100 %. Body mass index is 27.89 kg/m.   COGNITIVE FEATURES THAT CONTRIBUTE TO RISK:  None    SUICIDE RISK:   Mild:  Suicidal ideation of limited frequency, intensity, duration, and specificity.  There are no identifiable plans, no associated intent, mild dysphoria and related symptoms, good self-control (both objective and subjective assessment), few other risk factors, and identifiable protective factors, including available and accessible social support.  PLAN OF CARE:   Safety and Monitoring: Voluntary admission to inpatient psychiatric unit for safety, stabilization and treatment Daily contact with patient to assess and evaluate symptoms and progress in treatment Patient's case to be  discussed in multi-disciplinary team meeting Observation Level : q15 minute checks Vital signs: q12 hours Precautions: safety     Diagnoses:  Principal Problem:   Bipolar affective disorder, currently depressed, mild (HCC)     Bipolar disorder, current episode depressed, mild -Start Zyprexa 10 mg at bedtime -Stop Depakote DR 500 mg 2 times daily   Anxiety -Start Hydroxyzine 25 mg every 6 hours PRN -Start Prozac 20 mg daily -Start buspirone 7.5 mg 2 times daily     Other PRNS -Start Tylenol 650 mg every 6 hours PRN for mild pain -Start Maalox 30 mg every 4 hrs PRN for indigestion -Start Milk of Magnesia as needed every 6 hrs for constipation -Start trazodone 50 mg at bedtime as needed for insomnia   Provider to encourage patient to enroll at [facility] for substance abuse   Long Term Goal(s): Improvement in symptoms so as ready for discharge   Short Term Goals: Ability to identify changes in lifestyle to reduce recurrence of condition will improve, Ability to verbalize feelings will improve, Ability to disclose and discuss suicidal ideas, Ability to demonstrate self-control will improve, Ability to identify and develop effective coping behaviors will improve, Ability to maintain clinical measurements within normal limits will improve, Compliance with prescribed medications will improve, and Ability to identify triggers associated with substance abuse/mental health issues will improve.   Discharge Planning: Social work and case management to assist with discharge planning and identification of hospital follow-up needs prior to discharge Estimated LOS: 5-7 days Discharge Concerns: Need to establish a safety plan; Medication compliance and effectiveness Discharge Goals: Return home with outpatient referrals for mental health follow-up including  medication management/psychotherapy  I certify that inpatient services furnished can reasonably be expected to improve the patient's  condition.   Malachy Mood, PA 03/06/2022, 4:12 PM

## 2022-03-06 NOTE — [facility] Suicide Risk Assessment (Signed)
[facility] INPATIENT:  Family/Significant Other Suicide Prevention Education  Suicide Prevention Education:  Education Completed; [name] [phone] (Girlfriend) has been identified by the patient as the family member/significant other with whom the patient will be residing, and identified as the person(s) who will aid the patient in the event of a mental health crisis (suicidal ideations/suicide attempt).  With written consent from the patient, the family member/significant other has been provided the following suicide prevention education, prior to the and/or following the discharge of the patient.  The suicide prevention education provided includes the following: Suicide risk factors Suicide prevention and interventions National Suicide Hotline telephone number [facility] assessment telephone number [facility] Emergency Assistance [facility] and/or Residential Mobile Crisis Unit telephone number  Request made of family/significant other to: Remove weapons (e.g., guns, rifles, knives), all items previously/currently identified as safety concern.   Remove drugs/medications (over-the-counter, prescriptions, illicit drugs), all items previously/currently identified as a safety concern.  The family member/significant other verbalizes understanding of the suicide prevention education information provided.  The family member/significant other agrees to remove the items of safety concern listed above.  CSW spoke with [name] who states that her partner "struggles with addiction".  She states that they are attempting to sell her partners car because "as long as he can leave and go get drugs he will keep doing it".  She states that her partner was kicked out of the [employer] for using substances and she states that "I believe he can return after he goes through detox".  She states that there are no firearms or weapons in the home.  CSW completed SPE with [name].   [name]  [name] 03/06/2022, 3:32 PM

## 2022-03-06 NOTE — Progress Notes (Signed)
   03/06/22 0530  Sleep  Number of Hours 7.25

## 2022-03-06 NOTE — Progress Notes (Signed)
Nursing Note: Pt isolative throughout shift, stayed in bed mostly.  Pt came out of room for medication, phone time and lab draw. Otherwise stayed in his bed. Pt denies suicidal ideation and AVH. Pt pleasant upon each interaction. Restarted home meds as prescribed.   03/06/22 0800  Psych Admission Type (Psych Patients Only)  Admission Status Voluntary  Psychosocial Assessment  Patient Complaints None  Eye Contact Brief  Facial Expression Anxious  Affect Anxious  Speech Logical/coherent  Interaction Minimal  Motor Activity Slow  Appearance/Hygiene Disheveled  Behavior Characteristics Cooperative  Mood Anxious;Depressed  Thought Process  Coherency Circumstantial  Content Blaming self  Delusions WDL  Perception WDL  Hallucination None reported or observed  Judgment Poor  Confusion WDL  Danger to Self  Current suicidal ideation? Denies  Self-Injurious Behavior No self-injurious ideation or behavior indicators observed or expressed   Danger to Others  Danger to Others None reported or observed

## 2022-03-06 NOTE — B[facility] Counselor (Signed)
Adult Comprehensive Assessment  Patient ID: [name], male   DOB: 09-10-1980, 41 y.o.   MRN: [id number]  Information Source: Information source: Patient  Current Stressors:  Patient states their primary concerns and needs for treatment are:: "Anxiety, depression, passive suicidal thoughts, and substance use" Patient states their goals for this hospitilization and ongoing recovery are:: "To stop using substances" Educational / Learning stressors: Pt reports having a 12th grade education Employment / Job issues: Pt reports receiving SSI since age 5yo and is unemployed Family Relationships: Pt reports having no relationship with his siblings Museum/gallery curator / Lack of resources (include bankruptcy): Pt reports receiving SSI, Medicaid, and Medicare Housing / Lack of housing: Pt reports living at a [employer] in [place] (include injuries & life threatening diseases): Pt reports no stressors Social relationships: Pt reports no stressors Substance abuse: Pt reports drinking 2 alcoholic beverages a day and using Methamphetamines 7 times a day Bereavement / Loss: Pt reports no stressors  Living/Environment/Situation:  Living Arrangements: Non-relatives/Friends Living conditions (as described by patient or guardian): Boarding House//[place] Who else lives in the home?: 6 housemates How long has patient lived in current situation?: 2 months What is atmosphere in current home: Comfortable, Supportive  Family History:  Marital status: Single Are you sexually active?: Yes What is your sexual orientation?: Heterosexual Has your sexual activity been affected by drugs, alcohol, medication, or emotional stress?: No Does patient have children?: No  Childhood History:  By whom was/is the patient raised?: Both parents Description of patient's relationship with caregiver when they were a child: "We got along pretty goof" Patient's description of current relationship with people  who raised him/her: "We still get along really good" How were you disciplined when you got in trouble as a child/adolescent?: Spankings and Groundings Did patient suffer any verbal/emotional/physical/sexual abuse as a child?: No Did patient suffer from severe childhood neglect?: No Has patient ever been sexually abused/assaulted/raped as an adolescent or adult?: No Was the patient ever a victim of a crime or a disaster?: No Witnessed domestic violence?: No Has patient been affected by domestic violence as an adult?: No  Education:  Highest grade of school patient has completed: 12th grade Currently a student?: No Learning disability?: No  Employment/Work Situation:   Employment Situation: On disability Why is Patient on Disability: for Bipolar Disorder How Long has Patient Been on Disability: 15 years Patient's Job has Been Impacted by Current Illness: No What is the Longest Time Patient has Held a Job?: 1 year 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 Has Patient ever Been in the [employer]?: No  Financial Resources:   Museum/gallery curator resources: [employer], Medicaid, Medicare Does patient have a Programmer, applications or guardian?: No  Alcohol/Substance Abuse:   What has been your use of drugs/alcohol within the last 12 months?: Pt reports drinking 2 alcoholic beverages a day and using Methamphetamines 7 times a day If attempted suicide, did drugs/alcohol play a role in this?: No Alcohol/Substance Abuse Treatment Hx: Past Tx, Inpatient, Past Tx, Outpatient, Past detox If yes, describe treatment: Pt reports he cannot remember the last facility he was at Has alcohol/substance abuse ever caused legal problems?: No  Social Support System:   Pensions consultant Support System: Fair Astronomer System: Family, girlfriend, and friend Type of faith/religion: Darrick Meigs How does patient's faith help to cope with current illness?:  Prayer  Leisure/Recreation:   Do You Have Hobbies?: Yes Leisure and Hobbies: Gym  Strengths/Needs:   What  is the patient's perception of their strengths?: "I am not sure" Patient states they can use these personal strengths during their treatment to contribute to their recovery: N/A Patient states these barriers may affect/interfere with their treatment: None Patient states these barriers may affect their return to the community: None Other important information patient would like considered in planning for their treatment: None  Discharge Plan:   Currently receiving community mental health services: Yes (From Whom) [facility] for medications) Patient states concerns and preferences for aftercare planning are: Pt would like to remain with [facility] for medications but is interested in beginning therapy. Patient states they will know when they are safe and ready for discharge when: "I feel like I am ready now" Does patient have access to transportation?: Yes (Bus and walking) Does patient have financial barriers related to discharge medications?: Yes Patient description of barriers related to discharge medications: Limited Income Will patient be returning to same living situation after discharge?: Yes  Summary/Recommendations:   Summary and Recommendations (to be completed by the evaluator): [name] is a 41 year old, male, who was admitted to the hospital for depression, anxiety, passive suicidal thoughts, and substance use.  The Pt reports that the [facility] he was staying at asked him to "go somewhere to get clean before I can return there".  He states that he has no previous suicide attempts.  The Pt reports that he has 6 housemates and has been living at the [facility] for 2 months.  He states that this father is his only local family support and that his mother lives in [place].  He states that he has no contact with his 4 siblings.  The Pt reports no childhood  abuse or trauma.  He reports receiving SSI since the age of 41yo for his mental health.  He states that he also receives Medicaid and Medicare benefits. The Pt reports drinking 2 alcoholic beverages a day and using Methamphetamines 7 times a day.  He states that he has been to multiple, past inpatient, outpatient, and detox services but cannot remember the name of the last one he was admitted too.  He reports no current substance use treatment.  While in the hospital the Pt can benefit from crisis stabilization, medication evaluation, group therapy, psycho-education, case management, and discharge planning.  Upon discharge the Pt would like to return to the [facility].  It is recommended that the Pt continue to follow-up with his outpatient provider at [facility] for medication management and begin therapy with another outpatient provider in the area.  It is also recommended that the Pt continue to take all medications as prescribed by his providers.  Aram Beecham. 03/06/2022

## 2022-03-07 ENCOUNTER — Encounter (HOSPITAL_COMMUNITY): Payer: Self-pay

## 2022-03-07 NOTE — Progress Notes (Signed)
[facility] MD Progress Note  03/07/2022 12:33 PM LUCERO IDE  MRN:  [id number] Subjective:    [name] is a 41 year old male with a past psychiatric history significant for bipolar disorder who presented to [facility] due to worsening suicidal thoughts in the context of polysubstance usage.  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  When asked what triggered him to relapse on methamphetamine, patient stated that having money triggered him to start using again.  Patient was seen by Dominga Ferry for reevaluation.  Patient is currently being managed on the following psychiatric medications:  Zyprexa 10 mg at bedtime Prozac 20 mg daily Buspirone 7.5 mg 2 times daily  During the encounter, patient was visibly frustrated and irritable stating that he was ready to leave.  Patient denies experiencing depressive symptoms or anxiety.  He denies suicidal or homicidal ideations.  He further denies auditory or visual hallucinations and does not appear to be responding to internal/external stimuli.  Patient denies paranoia and feels that he is in a safe environment.  Patient denies experiencing delusional thoughts or telepathy.  Patient expressed to the provider that he has not been attending group due to being tired and wanting to self isolate.  Provider informed patient that he must attend group to expressing suicidal thoughts prior to admission.  Patient vocalized understanding.  Provider also encouraged patient to enroll at [facility] for substance abuse treatment.  Patient denies wanting to go to rehab stating that he does not want to lose his spot at the halfway house he was previously staying at.  Although patient endorses that his use of meth is an issue, he states that it will not be an issue due to not having access to his bank account anymore.  Provider asked permission to receive collateral from patient's fianc.  Patient  agreed to request.  Collateral received from Boneta Lucks (patient's fiance, [phone]) During conversation with the patient's fianc, patient's fiance states that patient expressed having suicidal thoughts prior to being admitted to [facility].  She reports that the patient has had suicidal thoughts in the past in the context of his drug addiction.  She reports that the patient continues to struggle with addiction and feels that she was enabling the patient in the past.  She expresses that the patient has been enrolled at [facility] in the past but the patient continues to use.  She states the patient is currently homeless and she is not willing to have him live with her until he can maintain his sobriety.  She reports that she does not have access to guns at her house but she does have kitchen knives.  She reports that the patient's father has a pistol but she has informed the father to hide the pistol whenever the patient is around him.  Boneta Lucks expresses that she has done everything possible to help get the patient to stop using but now it is up to him to want to get clean so that he can go back to the halfway house.  Principal Problem: Bipolar affective disorder, currently depressed, mild (HCC) Diagnosis: Principal Problem:   Bipolar affective disorder, currently depressed, mild (HCC)  Total Time spent with patient: 15 minutes  Past Psychiatric History:  Patient has a past psychiatric history significant for bipolar disorder and substance abuse disorder  Past Medical History:  Past Medical History:  Diagnosis Date   Bipolar 1 disorder (HCC)    Depression    Heart murmur  Substance abuse [facility])     Past Surgical History:  Procedure Laterality Date   BACK SURGERY  2011   had melanoma mole removed   Family History:  Family History  Problem Relation Age of Onset   Heart Problems Father    Family Psychiatric  History:  Patient denies a past family history of psychiatric illness Social History:   Social History   Substance and Sexual Activity  Alcohol Use Yes   Alcohol/week: 3.0 standard drinks of alcohol   Types: 3 Cans of beer per week   Comment: approximately 3 cans of beer weekly     Social History   Substance and Sexual Activity  Drug Use Yes   Frequency: 3.0 times per week   Types: Amphetamines, Methamphetamines   Comment: drugs are methamphetamines, amphetamines    Social History   Socioeconomic History   Marital status: Single    Spouse name: Not on file   Number of children: Not on file   Years of education: 12   Highest education level: 12th grade  Occupational History   Not on file  Tobacco Use   Smoking status: Former    Packs/day: 1.00    Years: 5.00    Total pack years: 5.00    Types: Cigarettes    Quit date: 06/04/2013    Years since quitting: 8.7    Passive exposure: Never   Smokeless tobacco: Never  Vaping Use   Vaping Use: Never used  Substance and Sexual Activity   Alcohol use: Yes    Alcohol/week: 3.0 standard drinks of alcohol    Types: 3 Cans of beer per week    Comment: approximately 3 cans of beer weekly   Drug use: Yes    Frequency: 3.0 times per week    Types: Amphetamines, Methamphetamines    Comment: drugs are methamphetamines, amphetamines   Sexual activity: Yes    Comment: girlfriend uses birth  control  Other Topics Concern   Not on file  Social History Narrative   Not on file   Social Determinants of Health   Financial Resource Strain: Not on file  Food Insecurity: Food Insecurity Present (03/05/2022)   Hunger Vital Sign    Worried About Running Out of Food in the Last Year: Sometimes true    Ran Out of Food in the Last Year: Sometimes true  Transportation Needs: No Transportation Needs (03/05/2022)   PRAPARE - Hydrologist (Medical): No    Lack of Transportation (Non-Medical): No  Physical Activity: Not on file  Stress: Not on file  Social Connections: Not on file   Additional  Social History:     Sleep: Good  Appetite:  Good  Current Medications: Current Facility-Administered Medications  Medication Dose Route Frequency Provider Last Rate Last Admin   acetaminophen (TYLENOL) tablet 650 mg  650 mg Oral Q6H PRN Ntuen, Kris Hartmann, FNP       alum & mag hydroxide-simeth (MAALOX/MYLANTA) 200-200-20 MG/5ML suspension 30 mL  30 mL Oral Q4H PRN Ntuen, Kris Hartmann, FNP       busPIRone (BUSPAR) tablet 7.5 mg  7.5 mg Oral BID Khrystian Schauf E, PA   7.5 mg at 03/07/22 0810   FLUoxetine (PROZAC) capsule 20 mg  20 mg Oral Daily Sindi Beckworth E, PA   20 mg at 03/07/22 0811   hydrOXYzine (ATARAX) tablet 25 mg  25 mg Oral TID PRN Laretta Bolster, FNP   25 mg at 03/07/22 1217  OLANZapine zydis (ZYPREXA) disintegrating tablet 10 mg  10 mg Oral Q8H PRN Ntuen, Jesusita Oka, FNP   10 mg at 03/06/22 1253   And   LORazepam (ATIVAN) tablet 1 mg  1 mg Oral PRN Ntuen, Jesusita Oka, FNP       And   ziprasidone (GEODON) injection 20 mg  20 mg Intramuscular PRN Ntuen, Jesusita Oka, FNP       magnesium hydroxide (MILK OF MAGNESIA) suspension 30 mL  30 mL Oral Daily PRN Ntuen, Jesusita Oka, FNP       OLANZapine (ZYPREXA) tablet 10 mg  10 mg Oral QHS Vernal Rutan E, PA   10 mg at 03/06/22 2123   traZODone (DESYREL) tablet 50 mg  50 mg Oral QHS PRN Cecilie Lowers, FNP        Lab Results:  Results for orders placed or performed during the hospital encounter of 03/05/22 (from the past 48 hour(s))  CBC     Status: None   Collection Time: 03/06/22  6:29 PM  Result Value Ref Range   WBC 6.7 4.0 - 10.5 K/uL   RBC 5.35 4.22 - 5.81 MIL/uL   Hemoglobin 16.5 13.0 - 17.0 g/dL   HCT 94.7 09.6 - 28.3 %   MCV 92.1 80.0 - 100.0 fL   MCH 30.8 26.0 - 34.0 pg   MCHC 33.5 30.0 - 36.0 g/dL   RDW 66.2 94.7 - 65.4 %   Platelets 268 150 - 400 K/uL   nRBC 0.0 0.0 - 0.2 %    Comment: Performed at [facility], [address], [place], [place] [address]  Comprehensive metabolic panel     Status: Abnormal   Collection  Time: 03/06/22  6:29 PM  Result Value Ref Range   Sodium 137 135 - 145 mmol/L   Potassium 3.9 3.5 - 5.1 mmol/L   Chloride 102 98 - 111 mmol/L   CO2 29 22 - 32 mmol/L   Glucose, Bld 110 (H) 70 - 99 mg/dL    Comment: Glucose reference range applies only to samples taken after fasting for at least 8 hours.   BUN 22 (H) 6 - 20 mg/dL   Creatinine, Ser 4.65 0.61 - 1.24 mg/dL   Calcium 9.3 8.9 - 68.1 mg/dL   Total Protein 8.3 (H) 6.5 - 8.1 g/dL   Albumin 4.7 3.5 - 5.0 g/dL   AST 33 15 - 41 U/L   ALT 63 (H) 0 - 44 U/L   Alkaline Phosphatase 67 38 - 126 U/L   Total Bilirubin 1.1 0.3 - 1.2 mg/dL   GFR, Estimated >27 >51 mL/min    Comment: (NOTE) Calculated using the CKD-EPI Creatinine Equation (2021)    Anion gap 6 5 - 15    Comment: Performed at [facility], [address], [place], [place] [address]  Hemoglobin A1c     Status: None   Collection Time: 03/06/22  6:29 PM  Result Value Ref Range   Hgb A1c MFr Bld 5.0 4.8 - 5.6 %    Comment: (NOTE) Pre diabetes:          5.7%-6.4%  Diabetes:              >6.4%  Glycemic control for   <7.0% adults with diabetes    Mean Plasma Glucose 96.8 mg/dL    Comment: Performed at [facility], [address] [place], [place] [address]  Lipid panel     Status: Abnormal  Collection Time: 03/06/22  6:29 PM  Result Value Ref Range   Cholesterol 217 (H) 0 - 200 mg/dL   Triglycerides 98 <161<150 mg/dL   HDL 67 >09>40 mg/dL   Total CHOL/HDL Ratio 3.2 RATIO   VLDL 20 0 - 40 mg/dL   LDL Cholesterol 604130 (H) 0 - 99 mg/dL    Comment:        Total Cholesterol/HDL:CHD Risk Coronary Heart Disease Risk Table                     Men   Women  1/2 Average Risk   3.4   3.3  Average Risk       5.0   4.4  2 X Average Risk   9.6   7.1  3 X Average Risk  23.4   11.0        Use the calculated Patient Ratio above and the CHD Risk Table to determine the patient's CHD Risk.        ATP III CLASSIFICATION (LDL):  <100     mg/dL   Optimal   [id number]  mg/dL   Near or Above                    Optimal  130-159  mg/dL   Borderline  [id number]  mg/dL   High  >295>190     mg/dL   Very High Performed at [facility], [address] [place], [place] [address]   TSH     Status: None   Collection Time: 03/06/22  6:29 PM  Result Value Ref Range   TSH 1.849 0.350 - 4.500 uIU/mL    Comment: Performed by a 3rd Generation assay with a functional sensitivity of <=0.01 uIU/mL. Performed at [facility], [address] [place], [place] [address]   Valproic acid level     Status: Abnormal   Collection Time: 03/06/22  6:29 PM  Result Value Ref Range   Valproic Acid Lvl <10 (L) 50.0 - 100.0 ug/mL    Comment: RESULT CONFIRMED BY MANUAL DILUTION Performed at [facility], [address] [place], [place] [address]     Blood Alcohol level:  Lab Results  Component Value Date   ETH <10 01/10/2022   ETH <10 11/11/2021    Metabolic Disorder Labs: Lab Results  Component Value Date   HGBA1C 5.0 03/06/2022   MPG 96.8 03/06/2022   No results found for: "PROLACTIN" Lab Results  Component Value Date   CHOL 217 (H) 03/06/2022   TRIG 98 03/06/2022   HDL 67 03/06/2022   CHOLHDL 3.2 03/06/2022   VLDL 20 03/06/2022   LDLCALC 130 (H) 03/06/2022    Physical Findings: AIMS:  , ,  ,  ,    CIWA:    COWS:     Musculoskeletal: Strength & Muscle Tone: within normal limits Gait & Station: normal Patient leans: N/A  Psychiatric Specialty Exam:  Presentation  General Appearance:  Appropriate for Environment; Casual  Eye Contact: Fair  Speech: Clear and Coherent; Normal Rate  Speech Volume: Normal  Handedness: Right   Mood and Affect  Mood: Euthymic  Affect: Congruent   Thought Process  Thought Processes: Coherent; Linear  Descriptions of Associations:Intact  Orientation:Full (Time, Place and Person)  Thought Content:WDL  History of  Schizophrenia/Schizoaffective disorder:No data recorded Duration of Psychotic Symptoms:No data recorded Hallucinations:Hallucinations: None  Ideas of Reference:None  Suicidal Thoughts:Suicidal Thoughts: No  Homicidal Thoughts:Homicidal Thoughts: No  Sensorium  Memory: Immediate Fair; Recent Fair; Remote Fair  Judgment: Fair  Insight: Fair   Art therapist  Concentration: Good  Attention Span: Good  Recall: Good  Fund of Knowledge: Good  Language: Good   Psychomotor Activity  Psychomotor Activity: Psychomotor Activity: Normal   Assets  Assets: Communication Skills; Housing; Physical Health   Sleep  Sleep: Sleep: Good    Physical Exam: Physical Exam Psychiatric:        Attention and Perception: Attention and perception normal. He does not perceive auditory or visual hallucinations.        Mood and Affect: Mood and affect normal. Mood is not anxious or depressed.        Speech: Speech normal.        Behavior: Behavior is agitated and withdrawn. Behavior is cooperative.        Thought Content: Thought content normal. Thought content is not paranoid or delusional. Thought content does not include homicidal or suicidal ideation.        Cognition and Memory: Cognition and memory normal.        Judgment: Judgment normal.    Review of Systems  Constitutional: Negative.   HENT: Negative.    Eyes: Negative.   Respiratory: Negative.    Cardiovascular: Negative.   Gastrointestinal: Negative.   Musculoskeletal: Negative.   Skin: Negative.   Neurological: Negative.   Endo/Heme/Allergies: Negative.   Psychiatric/Behavioral:  Positive for substance abuse. Negative for depression, hallucinations and suicidal ideas. The patient is not nervous/anxious and does not have insomnia.    Blood pressure (!) 122/94, pulse 86, temperature 97.7 F (36.5 C), temperature source Oral, resp. rate 18, height 5\' 11"  (1.803 m), weight 90.7 kg, SpO2 100 %. Body mass  index is 27.89 kg/m.   Treatment Plan Summary: Daily contact with patient to assess and evaluate symptoms and progress in treatment and Medication management  PLAN Safety and Monitoring: Voluntary admission to inpatient psychiatric unit for safety, stabilization and treatment Daily contact with patient to assess and evaluate symptoms and progress in treatment Patient's case to be discussed in multi-disciplinary team meeting Observation Level : q15 minute checks Vital signs: q12 hours Precautions: safety   Diagnoses:  Principal Problem:   Bipolar affective disorder, currently depressed, mild (HCC)     Bipolar disorder, current episode depressed, mild -Continue Zyprexa 10 mg at bedtime    Anxiety -Continue Hydroxyzine 25 mg every 6 hours PRN -Continue Prozac 20 mg daily -Continue buspirone 7.5 mg 2 times daily     Other PRNS -Start Tylenol 650 mg every 6 hours PRN for mild pain -Start Maalox 30 mg every 4 hrs PRN for indigestion -Start Milk of Magnesia as needed every 6 hrs for constipation -Start trazodone 50 mg at bedtime as needed for insomnia   Provider encouraged patient to enroll at [facility] for substance abuse Provider encouraged patient to attend group to cooperate with staff     Discharge Planning: Social work and case management to assist with discharge planning and identification of hospital follow-up needs prior to discharge Estimated LOS: 5-7 days Discharge Concerns: Need to establish a safety plan; Medication compliance and effectiveness Discharge Goals: Return home with outpatient referrals for mental health follow-up including medication management/psychotherapy   I certify that inpatient services furnished can reasonably be expected to improve the patient's condition.     [facility], PA 03/07/2022, 12:33 PM

## 2022-03-07 NOTE — Progress Notes (Signed)
Pt in bed most of the day so far. Pt did not go to lunch, reports anxiety and poor appetite. Pt did accept meal tray. Hydroxyzine administered prn. Pt denies si/hi/self harm thoughts as well as a/v hallucinations. Pt denied cravings for mind altering substances this morning. Q 15 minute checks ongoing for safety.

## 2022-03-07 NOTE — [facility] Group Notes (Signed)
Adult Goals Group Note  Date:  03/07/2022 Time:  2:06 PM  Group Topic/Focus:  Goals Group:   The focus of this group is to help patients establish daily goals to achieve during treatment and discuss how the patient can incorporate goal setting into their daily lives to aide in recovery.  Participation Level:  Did Not Attend  Kern Reap 03/07/2022, 2:06 PM

## 2022-03-07 NOTE — Progress Notes (Signed)
Patient did not attend the evening speaker NA meeting.  

## 2022-03-07 NOTE — BH IP Treatment Plan (Signed)
Interdisciplinary Treatment and Diagnostic Plan Update  03/07/2022 Time of Session: 1000 [name] MRN: [id number]  Principal Diagnosis: Bipolar affective disorder, currently depressed, mild (HCC)  Secondary Diagnoses: Principal Problem:   Bipolar affective disorder, currently depressed, mild (HCC)   Current Medications:  Current Facility-Administered Medications  Medication Dose Route Frequency Provider Last Rate Last Admin   acetaminophen (TYLENOL) tablet 650 mg  650 mg Oral Q6H PRN Ntuen, Jesusita Oka, FNP       alum & mag hydroxide-simeth (MAALOX/MYLANTA) 200-200-20 MG/5ML suspension 30 mL  30 mL Oral Q4H PRN Ntuen, Jesusita Oka, FNP       busPIRone (BUSPAR) tablet 7.5 mg  7.5 mg Oral BID Nwoko, Uchenna E, PA   7.5 mg at 03/07/22 0810   FLUoxetine (PROZAC) capsule 20 mg  20 mg Oral Daily Nwoko, Uchenna E, PA   20 mg at 03/07/22 0350   hydrOXYzine (ATARAX) tablet 25 mg  25 mg Oral TID PRN Cecilie Lowers, FNP   25 mg at 03/06/22 1254   OLANZapine zydis (ZYPREXA) disintegrating tablet 10 mg  10 mg Oral Q8H PRN Ntuen, Jesusita Oka, FNP   10 mg at 03/06/22 1253   And   LORazepam (ATIVAN) tablet 1 mg  1 mg Oral PRN Ntuen, Jesusita Oka, FNP       And   ziprasidone (GEODON) injection 20 mg  20 mg Intramuscular PRN Ntuen, Jesusita Oka, FNP       magnesium hydroxide (MILK OF MAGNESIA) suspension 30 mL  30 mL Oral Daily PRN Ntuen, Jesusita Oka, FNP       OLANZapine (ZYPREXA) tablet 10 mg  10 mg Oral QHS Nwoko, Uchenna E, PA   10 mg at 03/06/22 2123   traZODone (DESYREL) tablet 50 mg  50 mg Oral QHS PRN Ntuen, Jesusita Oka, FNP       PTA Medications: Medications Prior to Admission  Medication Sig Dispense Refill Last Dose   albuterol (VENTOLIN HFA) 108 (90 Base) MCG/ACT inhaler Inhale 2 puffs into the lungs every 6 (six) hours as needed.      benztropine (COGENTIN) 0.5 MG tablet Take 1 tablet (0.5 mg total) by mouth at bedtime. 30 tablet 0    busPIRone (BUSPAR) 5 MG tablet Take 5 mg by mouth 2 (two) times daily.       divalproex (DEPAKOTE) 500 MG DR tablet Take 1 tablet (500 mg total) by mouth 2 (two) times daily. 60 tablet 0    FLUoxetine (PROZAC) 20 MG capsule Take 1 capsule (20 mg total) by mouth every morning. 30 capsule 0    OLANZapine (ZYPREXA) 10 MG tablet Take 1 tablet (10 mg total) by mouth at bedtime. 30 tablet 0    sildenafil (VIAGRA) 50 MG tablet Take 50 mg by mouth daily as needed for erectile dysfunction.       Patient Stressors: Financial difficulties   Occupational concerns   Substance abuse   Traumatic event    Patient Strengths: Ability for insight  Average or above average intelligence  Capable of independent living  General fund of knowledge  Motivation for treatment/growth  Physical Health  Supportive family/friends   Treatment Modalities: Medication Management, Group therapy, Case management,  1 to 1 session with clinician, Psychoeducation, Recreational therapy.   Physician Treatment Plan for Primary Diagnosis: Bipolar affective disorder, currently depressed, mild (HCC) Long Term Goal(s): Improvement in symptoms so as ready for discharge   Short Term Goals: Ability to identify changes in lifestyle to reduce recurrence of condition will  improve Ability to verbalize feelings will improve Ability to disclose and discuss suicidal ideas Ability to demonstrate self-control will improve Ability to identify and develop effective coping behaviors will improve Ability to maintain clinical measurements within normal limits will improve Compliance with prescribed medications will improve Ability to identify triggers associated with substance abuse/mental health issues will improve  Medication Management: Evaluate patient's response, side effects, and tolerance of medication regimen.  Therapeutic Interventions: 1 to 1 sessions, Unit Group sessions and Medication administration.  Evaluation of Outcomes: Progressing  Physician Treatment Plan for Secondary Diagnosis: Principal  Problem:   Bipolar affective disorder, currently depressed, mild ([place])  Long Term Goal(s): Improvement in symptoms so as ready for discharge   Short Term Goals: Ability to identify changes in lifestyle to reduce recurrence of condition will improve Ability to verbalize feelings will improve Ability to disclose and discuss suicidal ideas Ability to demonstrate self-control will improve Ability to identify and develop effective coping behaviors will improve Ability to maintain clinical measurements within normal limits will improve Compliance with prescribed medications will improve Ability to identify triggers associated with substance abuse/mental health issues will improve     Medication Management: Evaluate patient's response, side effects, and tolerance of medication regimen.  Therapeutic Interventions: 1 to 1 sessions, Unit Group sessions and Medication administration.  Evaluation of Outcomes: Progressing   RN Treatment Plan for Primary Diagnosis: Bipolar affective disorder, currently depressed, mild ([place]) Long Term Goal(s): Knowledge of disease and therapeutic regimen to maintain health will improve  Short Term Goals: Ability to remain free from injury will improve, Ability to verbalize frustration and anger appropriately will improve, Ability to demonstrate self-control, Ability to participate in decision making will improve, Ability to verbalize feelings will improve, Ability to disclose and discuss suicidal ideas, Ability to identify and develop effective coping behaviors will improve, and Compliance with prescribed medications will improve  Medication Management: RN will administer medications as ordered by provider, will assess and evaluate patient's response and provide education to patient for prescribed medication. RN will report any adverse and/or side effects to prescribing provider.  Therapeutic Interventions: 1 on 1 counseling sessions, Psychoeducation, Medication  administration, Evaluate responses to treatment, Monitor vital signs and CBGs as ordered, Perform/monitor CIWA, COWS, AIMS and Fall Risk screenings as ordered, Perform wound care treatments as ordered.  Evaluation of Outcomes: Progressing   LCSW Treatment Plan for Primary Diagnosis: Bipolar affective disorder, currently depressed, mild ([place]) Long Term Goal(s): Safe transition to appropriate next level of care at discharge, Engage patient in therapeutic group addressing interpersonal concerns.  Short Term Goals: Engage patient in aftercare planning with referrals and resources, Increase social support, Increase ability to appropriately verbalize feelings, Increase emotional regulation, Facilitate acceptance of mental health diagnosis and concerns, Facilitate patient progression through stages of change regarding substance use diagnoses and concerns, Identify triggers associated with mental health/substance abuse issues, and Increase skills for wellness and recovery  Therapeutic Interventions: Assess for all discharge needs, 1 to 1 time with Social worker, Explore available resources and support systems, Assess for adequacy in community support network, Educate family and significant other(s) on suicide prevention, Complete Psychosocial Assessment, Interpersonal group therapy.  Evaluation of Outcomes: Progressing   Progress in Treatment: Attending groups: No. Participating in groups: No. Taking medication as prescribed: Yes. Toleration medication: Yes. Family/Significant other contact made: Yes, individual(s) contacted:  [name] [phone] (Girlfriend) Patient understands diagnosis: Yes. Discussing patient identified problems/goals with staff: Yes. Medical problems stabilized or resolved: Yes. Denies suicidal/homicidal ideation: No. Issues/concerns per  patient self-inventory: Yes. Other: none  New problem(s) identified: No, Describe:  none  New Short Term/Long Term Goal(s): Patient to  work towards detox, medication management for mood stabilization; elimination of SI thoughts; development of comprehensive mental wellness/sobriety plan.  Patient Goals: Patient states their goal for treatment is to "get back to the halfway house."    Discharge Plan or Barriers: No psychosocial barriers identified at this time, patient to return to place of residence when appropriate for discharge.   Reason for Continuation of Hospitalization: Depression Other; describe active substance use   Estimated Length of Stay: 1-7 days  Scribe for Treatment Team: Almedia Balls 03/07/2022 12:14 PM

## 2022-03-07 NOTE — Progress Notes (Signed)
   03/07/22 0530  Sleep  Number of Hours 9

## 2022-03-07 NOTE — Progress Notes (Signed)
   03/07/22 2100  Psych Admission Type (Psych Patients Only)  Admission Status Voluntary  Psychosocial Assessment  Patient Complaints None  Eye Contact Brief  Facial Expression Anxious  Affect Anxious;Sad  Speech Logical/coherent  Interaction Minimal  Motor Activity Slow  Appearance/Hygiene Disheveled  Behavior Characteristics Cooperative  Mood Anxious  Aggressive Behavior  Effect No apparent injury  Thought Process  Coherency Circumstantial  Content Blaming others  Delusions WDL  Perception WDL  Hallucination None reported or observed  Judgment Poor  Confusion WDL  Danger to Self  Current suicidal ideation? Denies  Danger to Others  Danger to Others None reported or observed

## 2022-03-07 NOTE — Group Note (Signed)
LCSW Group Therapy Note   Group Date: 03/07/2022 Start Time: 1300 End Time: 1400  Type of Therapy and Topic: Group Therapy: Anger Management   Participation Level:  Active  Description of Group: In this group, patients will learn helpful strategies and techniques to manage anger, express anger in alternative ways, change hostile attitudes, and prevent aggressive acts, such as verbal abuse and violence. This group will be process-oriented and educational, with patients participating in exploration of their own experiences as well as giving and receiving support and challenge from other group members.  Therapeutic Goals: Patient will learn to manage anger. Patient will learn to stop violence or the threat of violence. Patient will learn to develop self control over thoughts and actions. Patient will receive support and feedback from others  Summary of Patient Progress: The Pt came to group and remained there the entire time.  The Pt accepted all worksheets and participated openly in the group discussion.  The Pt was appropriate with peers and discussed various ways they can cope with anger.    Therapeutic Modalities: Cognitive Behavioral Therapy Solution Focused Therapy Motivational [place], [place] 03/07/2022  2:03 PM

## 2022-03-07 NOTE — Group Note (Signed)
Recreation Therapy Group Note   Group Topic:Team Building  Group Date: 03/07/2022 Start Time: 0930 End Time: 0959 Facilitators: Jusiah Aguayo-McCall, LRT,CTRS Location: [address] Dayroom   Goal Area(s) Addresses:  Patient will effectively work with peer towards shared goal.  Patient will identify skill used to make activity successful.  Patient will identify how skills used during activity can be used to reach post d/c goals.    Group Description: Patient(s) were given a set of solo cups, a rubber band, and some tied strings. The objective is to build a pyramid with the cups by only using the rubber band and string to move the cups. After the activity the patient(s) are LRT debriefed and discussed what strategies worked, what didn't, and what lessons they can take from the activity and use in life post discharge.    Affect/Mood: N/A   Participation Level: Did not attend    Clinical Observations/Individualized Feedback:     Plan: Continue to engage patient in RT group sessions 2-3x/week.   Mills Mitton-McCall, LRT,CTRS 03/07/2022 12:09 PM

## 2022-03-08 DIAGNOSIS — F3131 Bipolar disorder, current episode depressed, mild: Principal | ICD-10-CM

## 2022-03-08 MED ORDER — OLANZAPINE 10 MG PO TABS: 10.0000 mg | ORAL_TABLET | Freq: Every day | ORAL | 0 refills | Status: AC

## 2022-03-08 MED ORDER — BUSPIRONE HCL 7.5 MG PO TABS: 7.5000 mg | ORAL_TABLET | Freq: Two times a day (BID) | ORAL | 0 refills | Status: DC

## 2022-03-08 MED ORDER — FLUOXETINE HCL 20 MG PO CAPS: 20.0000 mg | ORAL_CAPSULE | Freq: Every morning | ORAL | 0 refills | Status: AC

## 2022-03-08 MED ORDER — SILDENAFIL CITRATE 50 MG PO TABS: 50.0000 mg | ORAL_TABLET | Freq: Every day | ORAL | 0 refills | Status: AC | PRN

## 2022-03-08 MED ORDER — BENZTROPINE MESYLATE 0.5 MG PO TABS: 0.5000 mg | ORAL_TABLET | Freq: Every day | ORAL | 0 refills | Status: DC

## 2022-03-08 NOTE — Progress Notes (Signed)
D:  Patient denied SI and HI, contracts for safety.  Denied A/V hallucinations.  Denied pain. A:  Medications administered per MD orders.  Emotional support and encouragement given patient. R:  Safety maintained with 15 minute checks.  Patient stated he slept 8 hours last night.

## 2022-03-08 NOTE — [facility] Suicide Risk Assessment (Signed)
[place] INPATIENT:  Family/Significant Other Suicide Prevention Education  Suicide Prevention Education:  Education Completed; [name] [phone] (Father) has been identified by the patient as the family member/significant other with whom the patient will be residing, and identified as the person(s) who will aid the patient in the event of a mental health crisis (suicidal ideations/suicide attempt).  With written consent from the patient, the family member/significant other has been provided the following suicide prevention education, prior to the and/or following the discharge of the patient.  The suicide prevention education provided includes the following: Suicide risk factors Suicide prevention and interventions National Suicide Hotline telephone number [facility] assessment telephone number [facility] Emergency Assistance [place] and/or Residential Mobile Crisis Unit telephone number  Request made of family/significant other to: Remove weapons (e.g., guns, rifles, knives), all items previously/currently identified as safety concern.   Remove drugs/medications (over-the-counter, prescriptions, illicit drugs), all items previously/currently identified as a safety concern.  The family member/significant other verbalizes understanding of the suicide prevention education information provided.  The family member/significant other agrees to remove the items of safety concern listed above.   CSW spoke with Mr. Tokunaga who states that his son cannot come back to his home.  He states that his son is not allowed at his home due to substance use.  Mr. Aro states that there are firearms in his home but states that they are hidden and his son does not know where they are because "he is not allowed inside the house". He states that he is aware that his son is in the hospital but has not spoken with him about the reasons for his current hospitalization.  He states that his  son has been having suicidal thoughts and substance use for 25 years due to drugs and life choices. He states that his son will be returning to the [facility] at discharge but he feels that his son could benefit from a long term treatment facility.  CSW completed SPE with Mr. Wirick.  [name] [name] 03/08/2022, 10:31 AM

## 2022-03-08 NOTE — Progress Notes (Signed)
Patient discharged with D/C instructions. Patient was given the opportunity to ask questions. Patient verbalizes a safety plan to discharge. Denies SI/HI/AVH on discharge.

## 2022-03-08 NOTE — Discharge Summary (Signed)
Physician Discharge Summary Note  Patient:  [name] is an 41 y.o., male MRN:  [id number] DOB:  Jan 04, 1981 Patient phone:  [phone] (home)  Patient address:   [address] [place] [address],  Total Time spent with patient: 15 minutes  Date of Admission:  03/05/2022 Date of Discharge: 03/08/2022  Reason for Admission:    [name] is a 41 year old male with a past psychiatric history significant for bipolar disorder who presented to [facility] due to worsening suicidal thoughts in the context of polysubstance usage.  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  When asked what triggered him to relapse on methamphetamine, patient stated that having money triggered him to start using again.  Patient denied experiencing any withdrawal symptoms at this time.   Patient also endorses depression and anxiety.  Patient states that he has been dealing with depression for 25 years.  He states that he experiences depressive episodes off and on but whenever he does feel depressed, he states that his symptoms last throughout the day.  Patient states that his depressive episodes are worsened by using drugs.  He states that his depressive symptoms are alleviated by activity and talking to friends from recovery groups.  Patient's depressive episodes are characterized by the following symptoms: feelings of sadness, decreased energy, feelings of guilt/worthlessness, and helplessness.  He denies decreased concentration, lack of motivation, irritability, or issues with his sleep patterns or eating habits.  Patient rates his anxiety as 5 or 6 out of 10 (with 10 being worst).  Patient states that his anxiety is alleviated by medicine, but worsened by drug use.  Triggers to his anxiety include using drugs and over thinking.  Patient denies panic attack.  Patient's current stressor involves struggling to stay clean.   Patient  has a history of being seen at [facility], with his last time being seen at this facility on 01/13/2022 due to substance abuse.  During that encounter, patient was given outpatient resources for substance abuse.  Patient was also seen at [facility] on 11/13/2021 due to suicidal ideations with a plan to run into traffic as well as requesting substance abuse treatment.  During that encounter, patient was admitted to the [facility] and subsequently discharged on 11/14/2021.  Patient was placed on the following medications upon discharge:    Benztropine 0.5 mg at bedtime Buspirone 7.5 mg 2 times daily Divalproex 500 mg DR 2 times daily Fluoxetine 20 mg daily Olanzapine 10 mg at bedtime   Patient reports that he has been without his medications since Saturday.  Principal Problem: Bipolar affective disorder, currently depressed, mild (HCC) Discharge Diagnoses: Principal Problem:   Bipolar affective disorder, currently depressed, mild (HCC)   Past Psychiatric History: Patient has a past psychiatric history significant for bipolar disorder and substance abuse disorder  Past Medical History:  Past Medical History:  Diagnosis Date   Bipolar 1 disorder (HCC)    Depression    Heart murmur    Substance abuse (HCC)     Past Surgical History:  Procedure Laterality Date   BACK SURGERY  2011   had melanoma mole removed   Family History:  Family History  Problem Relation Age of Onset   Heart Problems Father    Family Psychiatric  History:  Patient denies a past family history of psychiatric illness  Social History:  Social History   Substance and Sexual Activity  Alcohol  Use Yes   Alcohol/week: 3.0 standard drinks of alcohol   Types: 3 Cans of beer per week   Comment: approximately 3 cans of beer weekly     Social History   Substance and Sexual Activity  Drug Use Yes   Frequency: 3.0 times per week   Types: Amphetamines,  Methamphetamines   Comment: drugs are methamphetamines, amphetamines    Social History   Socioeconomic History   Marital status: Single    Spouse name: Not on file   Number of children: Not on file   Years of education: 12   Highest education level: 12th grade  Occupational History   Not on file  Tobacco Use   Smoking status: Former    Packs/day: 1.00    Years: 5.00    Total pack years: 5.00    Types: Cigarettes    Quit date: 06/04/2013    Years since quitting: 8.7    Passive exposure: Never   Smokeless tobacco: Never  Vaping Use   Vaping Use: Never used  Substance and Sexual Activity   Alcohol use: Yes    Alcohol/week: 3.0 standard drinks of alcohol    Types: 3 Cans of beer per week    Comment: approximately 3 cans of beer weekly   Drug use: Yes    Frequency: 3.0 times per week    Types: Amphetamines, Methamphetamines    Comment: drugs are methamphetamines, amphetamines   Sexual activity: Yes    Comment: girlfriend uses birth  control  Other Topics Concern   Not on file  Social History Narrative   Not on file   Social Determinants of Health   Financial Resource Strain: Not on file  Food Insecurity: Food Insecurity Present (03/05/2022)   Hunger Vital Sign    Worried About Running Out of Food in the Last Year: Sometimes true    Ran Out of Food in the Last Year: Sometimes true  Transportation Needs: No Transportation Needs (03/05/2022)   PRAPARE - Administrator, Civil ServiceTransportation    Lack of Transportation (Medical): No    Lack of Transportation (Non-Medical): No  Physical Activity: Not on file  Stress: Not on file  Social Connections: Not on file    HOSPITAL COURSE:   During the patient's hospitalization, patient had extensive initial psychiatric evaluation, and follow-up psychiatric evaluations every day.   Psychiatric diagnoses provided upon initial assessment: Bipolar disorder, current episode depressed, mild   Patient's psychiatric medications were adjusted on admission:  Patient was placed on previously established meds: -Continue Zyprexa 10 mg at bedtime -Continue Prozac 20 mg daily -Continue buspirone 7.5 mg 2 times daily -Continue hydroxyzine 25 mg every 6 hours as needed   During the hospitalization, other adjustments were made to the patient's psychiatric medication regimen: Patient stopped Depakote DR 500 mg 2 times daily   Patient's care was discussed during the interdisciplinary team meeting every day during the hospitalization.   The patient denied having side effects to prescribed psychiatric medication.   Gradually, patient started adjusting to milieu. The patient was evaluated each day by a clinical provider to ascertain response to treatment. Improvement was noted by the patient's report of decreasing symptoms, improved sleep and appetite, affect, medication tolerance, behavior, and participation in unit programming.  Patient was asked each day to complete a self inventory noting mood, mental status, pain, new symptoms, anxiety and concerns.     Symptoms were reported as significantly decreased or resolved completely by discharge.    On day of discharge, the patient reports  that their mood is stable. The patient denied having suicidal thoughts for more than 48 hours prior to discharge.  Patient denies having homicidal thoughts.  Patient denies having auditory hallucinations.  Patient denies any visual hallucinations or other symptoms of psychosis. The patient was motivated to continue taking medication with a goal of continued improvement in mental health.    The patient reports their target psychiatric symptoms of bipolar depression and suicidal thoughts responded well to the psychiatric medications, and the patient reports overall benefit other psychiatric hospitalization. Supportive psychotherapy was provided to the patient. The patient also participated in regular group therapy while hospitalized. Coping skills, problem solving as well as  relaxation therapies were also part of the unit programming.   Labs were reviewed with the patient, and abnormal results were discussed with the patient.   The patient is able to verbalize their individual safety plan to this provider.   # It is recommended to the patient to continue psychiatric medications as prescribed, after discharge from the hospital.     # It is recommended to the patient to follow up with your outpatient psychiatric provider and PCP.   # It was discussed with the patient, the impact of alcohol, drugs, tobacco have been there overall psychiatric and medical wellbeing, and total abstinence from substance use was recommended the patient.ed.   # Prescriptions provided or sent directly to preferred pharmacy at discharge. Patient agreeable to plan. Given opportunity to ask questions. Appears to feel comfortable with discharge.    # In the event of worsening symptoms, the patient is instructed to call the crisis hotline, 911 and or go to the nearest ED for appropriate evaluation and treatment of symptoms. To follow-up with primary care provider for other medical issues, concerns and or health care needs   # Patient was discharged to halfway house with a plan to follow up as noted below.   Physical Findings: AIMS:  , ,  ,  ,    CIWA:    COWS:     Musculoskeletal: Strength & Muscle Tone: within normal limits Gait & Station: normal Patient leans: N/A  Aims score zero on day of dc. No eps on day of dc  Psychiatric Specialty Exam:  Presentation  General Appearance:  Appropriate for Environment; Casual  Eye Contact: Good  Speech: Clear and Coherent; Normal Rate  Speech Volume: Normal  Handedness: Right   Mood and Affect  Mood: Euthymic  Affect: Appropriate; Congruent   Thought Process  Thought Processes: Coherent; Goal Directed; Linear  Descriptions of Associations:Intact  Orientation:Full (Time, Place and Person)  Thought  Content:WDL  History of Schizophrenia/Schizoaffective disorder:No data recorded Duration of Psychotic Symptoms:No data recorded Hallucinations:Hallucinations: None  Ideas of Reference:None  Suicidal Thoughts:Suicidal Thoughts: No  Homicidal Thoughts:Homicidal Thoughts: No   Sensorium  Memory: Immediate Good; Recent Good; Remote Good  Judgment: Good  Insight: Fair   Art therapist  Concentration: Good  Attention Span: Good  Recall: Good  Fund of Knowledge: Good  Language: Good   Psychomotor Activity  Psychomotor Activity: Psychomotor Activity: Normal   Assets  Assets: Communication Skills; Desire for Improvement; Physical Health; Social Support; Housing   Sleep  Sleep: Sleep: Good Number of Hours of Sleep: 8    Physical Exam: Physical Exam Psychiatric:        Attention and Perception: Attention and perception normal. He does not perceive auditory or visual hallucinations.        Mood and Affect: Mood and affect normal. Mood is not  anxious or depressed.        Speech: Speech normal.        Behavior: Behavior normal. Behavior is not agitated. Behavior is cooperative.        Thought Content: Thought content normal. Thought content is not paranoid or delusional. Thought content does not include homicidal or suicidal ideation.        Cognition and Memory: Cognition and memory normal.        Judgment: Judgment normal.    Review of Systems  Constitutional: Negative.   HENT: Negative.    Eyes: Negative.   Respiratory: Negative.    Cardiovascular: Negative.   Gastrointestinal: Negative.   Musculoskeletal: Negative.   Skin: Negative.   Neurological: Negative.   Psychiatric/Behavioral:  Negative for depression, hallucinations, substance abuse and suicidal ideas. The patient is not nervous/anxious and does not have insomnia.    Blood pressure (!) 144/95, pulse 96, temperature 97.9 F (36.6 C), temperature source Oral, resp. rate 18, height 5'  11" (1.803 m), weight 90.7 kg, SpO2 95 %. Body mass index is 27.89 kg/m.   Social History   Tobacco Use  Smoking Status Former   Packs/day: 1.00   Years: 5.00   Total pack years: 5.00   Types: Cigarettes   Quit date: 06/04/2013   Years since quitting: 8.7   Passive exposure: Never  Smokeless Tobacco Never   Tobacco Cessation:  N/A, patient does not currently use tobacco products   Blood Alcohol level:  Lab Results  Component Value Date   ETH <10 01/10/2022   ETH <10 11/11/2021    Metabolic Disorder Labs:  Lab Results  Component Value Date   HGBA1C 5.0 03/06/2022   MPG 96.8 03/06/2022   No results found for: "PROLACTIN" Lab Results  Component Value Date   CHOL 217 (H) 03/06/2022   TRIG 98 03/06/2022   HDL 67 03/06/2022   CHOLHDL 3.2 03/06/2022   VLDL 20 03/06/2022   LDLCALC 130 (H) 03/06/2022    See Psychiatric Specialty Exam and Suicide Risk Assessment completed by Attending Physician prior to discharge.  Discharge destination:  Other:  Halfway house in [place]  Is patient on multiple antipsychotic therapies at discharge:  No   Has Patient had three or more failed trials of antipsychotic monotherapy by history:  No  Recommended Plan for Multiple Antipsychotic Therapies: NA   Allergies as of 03/08/2022   No Known Allergies      Medication List     STOP taking these medications    divalproex 500 MG DR tablet Commonly known as: DEPAKOTE       TAKE these medications      Indication  albuterol 108 (90 Base) MCG/ACT inhaler Commonly known as: VENTOLIN HFA Inhale 2 puffs into the lungs every 6 (six) hours as needed.  Indication: Asthma   benztropine 0.5 MG tablet Commonly known as: COGENTIN Take 1 tablet (0.5 mg total) by mouth at bedtime.  Indication: Extrapyramidal Reaction caused by Medications   busPIRone 7.5 MG tablet Commonly known as: BUSPAR Take 1 tablet (7.5 mg total) by mouth 2 (two) times daily. What changed:  medication  strength how much to take  Indication: Anxiety Disorder   FLUoxetine 20 MG capsule Commonly known as: PROZAC Take 1 capsule (20 mg total) by mouth every morning.  Indication: Depressive Phase of Manic-Depression   OLANZapine 10 MG tablet Commonly known as: ZYPREXA Take 1 tablet (10 mg total) by mouth at bedtime.  Indication: Manic Phase of Manic-Depression  sildenafil 50 MG tablet Commonly known as: VIAGRA Take 1 tablet (50 mg total) by mouth daily as needed for erectile dysfunction.  Indication: Erectile Dysfunction        Follow-up Information     [facility] Follow up on 03/15/2022.   Why: You have a hospital follow up appointment for therapy and medication management services on 03/15/22 at 3:00 pm.  telehealth      This appointment will be Contact information: [address]  [place] [address] [phone]                 Follow-up recommendations:    Activity: as tolerated   Diet: heart healthy   Other: -Follow-up with your outpatient psychiatric provider -instructions on appointment date, time, and address (location) are provided to you in discharge paperwork.   -Take your psychiatric medications as prescribed at discharge - instructions are provided to you in the discharge paperwork. You are given sample medication plus paper Rx for [facility].    -Follow-up with outpatient primary care doctor and other specialists -for management of preventative medicine and chronic medical disease, including: hypertension   -Testing: Follow-up with outpatient provider for abnormal lab results:  none   -Recommend abstinence from alcohol, tobacco, and other illicit drug use at discharge.    -If your psychiatric symptoms recur, worsen, or if you have side effects to your psychiatric medications, call your outpatient psychiatric provider, 911, 988 or go to the nearest emergency department.   -If suicidal thoughts recur, call your outpatient psychiatric  provider, 911, 988 or go to the nearest emergency department.  Signed: Malachy Mood, PA 03/08/2022, 10:31 AM

## 2022-03-08 NOTE — Plan of Care (Signed)
Nurse discussed anxiety, depression and coping skills with patient.  

## 2022-03-08 NOTE — [facility] Suicide Risk Assessment (Signed)
Suicide Risk Assessment  Discharge Assessment    [facility] Discharge Suicide Risk Assessment   Principal Problem: Bipolar affective disorder, currently depressed, mild ([place]) Discharge Diagnoses: Principal Problem:   Bipolar affective disorder, currently depressed, mild ([place])  HPI:  [name] is a 41 year old male with a past psychiatric history significant for bipolar disorder who presented to [facility] due to worsening suicidal thoughts in the context of polysubstance usage.  Prior to his admission to [facility], patient reports relapsing on methamphetamine 2 days prior.  Patient denied experiencing any withdrawal symptoms at this time.   HOSPITAL COURSE:  During the patient's hospitalization, patient had extensive initial psychiatric evaluation, and follow-up psychiatric evaluations every day.  Psychiatric diagnoses provided upon initial assessment: Bipolar disorder, current episode depressed, mild  Patient's psychiatric medications were adjusted on admission: Patient was placed on previously established meds: -Continue Zyprexa 10 mg at bedtime -Continue Prozac 20 mg daily -Continue buspirone 7.5 mg 2 times daily -Continue hydroxyzine 25 mg every 6 hours as needed  During the hospitalization, other adjustments were made to the patient's psychiatric medication regimen: Patient stopped Depakote DR 500 mg 2 times daily  Patient's care was discussed during the interdisciplinary team meeting every day during the hospitalization.  The patient denied having side effects to prescribed psychiatric medication.  Gradually, patient started adjusting to milieu. The patient was evaluated each day by a clinical provider to ascertain response to treatment. Improvement was noted by the patient's report of decreasing symptoms, improved sleep and appetite, affect, medication tolerance, behavior, and participation in unit programming.  Patient was asked each day to complete a self  inventory noting mood, mental status, pain, new symptoms, anxiety and concerns.    Symptoms were reported as significantly decreased or resolved completely by discharge.   On day of discharge, the patient reports that their mood is stable. The patient denied having suicidal thoughts for more than 48 hours prior to discharge.  Patient denies having homicidal thoughts.  Patient denies having auditory hallucinations.  Patient denies any visual hallucinations or other symptoms of psychosis. The patient was motivated to continue taking medication with a goal of continued improvement in mental health.   The patient reports their target psychiatric symptoms of bipolar depression and suicidal thoughts responded well to the psychiatric medications, and the patient reports overall benefit other psychiatric hospitalization. Supportive psychotherapy was provided to the patient. The patient also participated in regular group therapy while hospitalized. Coping skills, problem solving as well as relaxation therapies were also part of the unit programming.  Labs were reviewed with the patient, and abnormal results were discussed with the patient.  The patient is able to verbalize their individual safety plan to this provider.  # It is recommended to the patient to continue psychiatric medications as prescribed, after discharge from the hospital.    # It is recommended to the patient to follow up with your outpatient psychiatric provider and PCP.  # It was discussed with the patient, the impact of alcohol, drugs, tobacco have been there overall psychiatric and medical wellbeing, and total abstinence from substance use was recommended the patient.ed.  # Prescriptions provided or sent directly to preferred pharmacy at discharge. Patient agreeable to plan. Given opportunity to ask questions. Appears to feel comfortable with discharge.    # In the event of worsening symptoms, the patient is instructed to call the  crisis hotline, 911 and or go to the nearest ED for appropriate evaluation and treatment of symptoms. To  follow-up with primary care provider for other medical issues, concerns and or health care needs  # Patient was discharged to halfway house with a plan to follow up as noted below.    Total Time spent with patient: 15 minutes  Musculoskeletal: Strength & Muscle Tone: within normal limits Gait & Station: normal Patient leans: N/A  Aims score zero on day of dc. No eps on day of dc  Psychiatric Specialty Exam  Presentation  General Appearance:  Appropriate for Environment; Casual  Eye Contact: Good  Speech: Clear and Coherent; Normal Rate  Speech Volume: Normal  Handedness: Right   Mood and Affect  Mood: Euthymic  Duration of Depression Symptoms: No data recorded Affect: Appropriate; Congruent   Thought Process  Thought Processes: Coherent; Goal Directed; Linear  Descriptions of Associations:Intact  Orientation:Full (Time, Place and Person)  Thought Content:WDL  History of Schizophrenia/Schizoaffective disorder:No data recorded Duration of Psychotic Symptoms:No data recorded Hallucinations:Hallucinations: None  Ideas of Reference:None  Suicidal Thoughts:Suicidal Thoughts: No  Homicidal Thoughts:Homicidal Thoughts: No   Sensorium  Memory: Immediate Good; Recent Good; Remote Good  Judgment: Good  Insight: Fair   Community education officer  Concentration: Good  Attention Span: Good  Recall: Good  Fund of Knowledge: Good  Language: Good   Psychomotor Activity  Psychomotor Activity: Psychomotor Activity: Normal   Assets  Assets: Communication Skills; Desire for Improvement; Physical Health; Social Support; Housing   Sleep  Sleep: Sleep: Good Number of Hours of Sleep: 8   Physical Exam: Physical Exam Psychiatric:        Attention and Perception: Attention and perception normal. He does not perceive auditory or visual  hallucinations.        Mood and Affect: Mood and affect normal. Mood is not anxious or depressed.        Speech: Speech normal.        Behavior: Behavior normal. Behavior is not agitated. Behavior is cooperative.        Thought Content: Thought content normal. Thought content is not paranoid or delusional. Thought content does not include homicidal or suicidal ideation.        Cognition and Memory: Cognition and memory normal.        Judgment: Judgment normal.    Review of Systems  Constitutional: Negative.   HENT: Negative.    Eyes: Negative.   Respiratory: Negative.    Cardiovascular: Negative.   Gastrointestinal: Negative.   Musculoskeletal: Negative.   Skin: Negative.   Neurological: Negative.   Psychiatric/Behavioral:  Positive for substance abuse. Negative for depression, hallucinations and suicidal ideas. The patient is not nervous/anxious and does not have insomnia.    Blood pressure (!) 144/95, pulse 96, temperature 97.9 F (36.6 C), temperature source Oral, resp. rate 18, height 5\' 11"  (1.803 m), weight 90.7 kg, SpO2 95 %. Body mass index is 27.89 kg/m.  Mental Status Per Nursing Assessment::   On Admission:  Self-harm thoughts  Nursing information obtained from:  Patient Demographic factors:  Male, Caucasian, Low socioeconomic status Loss Factors:  Decrease in vocational status, Financial problems / change in socioeconomic status Historical Factors:  Impulsivity Risk Reduction Factors:  NA   Follow-up Information     [facility] Follow up.   Why: You have a hospital follow up appointment for therapy and medication management services on 03/15/22 at 3:00 pm.  telehealth      This appointment will be Contact information: [address]  [place] [phone]  Plan Of Care/Follow-up recommendations:  Activity: as tolerated   Diet: heart healthy   Other: -Follow-up with your outpatient psychiatric provider  -instructions on appointment date, time, and address (location) are provided to you in discharge paperwork.   -Take your psychiatric medications as prescribed at discharge - instructions are provided to you in the discharge paperwork. You are given sample medication plus paper Rx for [facility].    -Follow-up with outpatient primary care doctor and other specialists -for management of preventative medicine and chronic medical disease, including: hypertension   -Testing: Follow-up with outpatient provider for abnormal lab results:  none   -Recommend abstinence from alcohol, tobacco, and other illicit drug use at discharge.    -If your psychiatric symptoms recur, worsen, or if you have side effects to your psychiatric medications, call your outpatient psychiatric provider, 911, 988 or go to the nearest emergency department.   -If suicidal thoughts recur, call your outpatient psychiatric provider, 911, 988 or go to the nearest emergency department.  Malachy Mood, PA 03/08/2022, 9:52 AM

## 2022-03-08 NOTE — Progress Notes (Addendum)
Patient's self inventory sheet, patient sleeps good, no sleep medication.  Good appetite, normal energy level, good concentration.  Denied depression, anxiety, hopeless.  Denied withdrawals.  Dnied SI.  Denied physical problems.  Denied physical pain.  Goal is talk to MD.  Move back to [facility].  Does have discharge plans.  Safety maintained with 15 minute checks.

## 2022-03-08 NOTE — Progress Notes (Signed)
   03/08/22 0530  Sleep  Number of Hours 6

## 2022-03-08 NOTE — Progress Notes (Signed)
  [facility] Adult Case Management Discharge Plan :  Will you be returning to the same living situation after discharge:  Yes,  [place]  At discharge, do you have transportation home?: Yes,  Girlfriend  Do you have the ability to pay for your medications: Yes,  Medicare   Release of information consent forms completed and in the chart;  Patient's signature needed at discharge.  Patient to Follow up at:  Follow-up Information     [facility] Follow up on 03/15/2022.   Why: You have a hospital follow up appointment for therapy and medication management services on 03/15/22 at 3:00 pm.  telehealth      This appointment will be Contact information: [address]  [place]  [phone]                 Next level of care provider has access to [place] and Suicide Prevention discussed: Yes,  with patient, girlfriend, and father      Has patient been referred to the Quitline?: N/A patient is not a smoker  Patient has been referred for addiction treatment: Yes, [place], [place] 03/08/2022, 10:43 AM

## 2022-03-29 ENCOUNTER — Other Ambulatory Visit (HOSPITAL_COMMUNITY): Payer: Self-pay | Admitting: Physician Assistant

## 2022-04-09 ENCOUNTER — Other Ambulatory Visit (HOSPITAL_COMMUNITY): Payer: Self-pay | Admitting: Physician Assistant

## 2022-04-11 ENCOUNTER — Other Ambulatory Visit (HOSPITAL_COMMUNITY): Payer: Self-pay | Admitting: Physician Assistant

## 2022-05-03 ENCOUNTER — Other Ambulatory Visit (HOSPITAL_COMMUNITY): Payer: Self-pay | Admitting: Physician Assistant

## 2022-10-16 ENCOUNTER — Ambulatory Visit (HOSPITAL_COMMUNITY)
Admission: EM | Admit: 2022-10-16 | Discharge: 2022-10-17 | Disposition: A | Discharge status: Home / Self Care | Payer: 59 | Attending: Psychiatry | Admitting: Psychiatry

## 2022-10-16 DIAGNOSIS — Z5900 Homelessness unspecified: Secondary | ICD-10-CM | POA: Diagnosis not present

## 2022-10-16 DIAGNOSIS — F319 Bipolar disorder, unspecified: Secondary | ICD-10-CM | POA: Insufficient documentation

## 2022-10-16 DIAGNOSIS — R011 Cardiac murmur, unspecified: Secondary | ICD-10-CM | POA: Diagnosis not present

## 2022-10-16 DIAGNOSIS — F17290 Nicotine dependence, other tobacco product, uncomplicated: Secondary | ICD-10-CM | POA: Diagnosis not present

## 2022-10-16 DIAGNOSIS — Z1152 Encounter for screening for COVID-19: Secondary | ICD-10-CM | POA: Insufficient documentation

## 2022-10-16 DIAGNOSIS — F1910 Other psychoactive substance abuse, uncomplicated: Secondary | ICD-10-CM | POA: Diagnosis present

## 2022-10-16 DIAGNOSIS — F1490 Cocaine use, unspecified, uncomplicated: Secondary | ICD-10-CM | POA: Diagnosis not present

## 2022-10-16 LAB — COMPREHENSIVE METABOLIC PANEL
ALT: 35 U/L (ref 0–44)
AST: 24 U/L (ref 15–41)
Albumin: 4.1 g/dL (ref 3.5–5.0)
Alkaline Phosphatase: 62 U/L (ref 38–126)
Anion gap: 10 (ref 5–15)
BUN: 7 mg/dL (ref 6–20)
CO2: 26 mmol/L (ref 22–32)
Calcium: 9.1 mg/dL (ref 8.9–10.3)
Chloride: 100 mmol/L (ref 98–111)
Creatinine, Ser: 0.88 mg/dL (ref 0.61–1.24)
GFR, Estimated: >60 mL/min (ref >60)
Glucose, Bld: 98 mg/dL (ref 70–99)
Potassium: 3.6 mmol/L (ref 3.5–5.1)
Sodium: 136 mmol/L (ref 135–145)
Total Bilirubin: 0.9 mg/dL (ref 0.3–1.2)
Total Protein: 6.8 g/dL (ref 6.5–8.1)

## 2022-10-16 LAB — POCT URINE DRUG SCREEN - MANUAL ENTRY (I-SCREEN)
POC Amphetamine UR: POSITIVE — AB
POC Buprenorphine (BUP): NOT DETECTED
POC Cocaine UR: NOT DETECTED
POC Marijuana UR: NOT DETECTED
POC Methadone UR: NOT DETECTED
POC Methamphetamine UR: POSITIVE — AB
POC Morphine: NOT DETECTED
POC Oxazepam (BZO): NOT DETECTED
POC Oxycodone UR: NOT DETECTED
POC Secobarbital (BAR): NOT DETECTED

## 2022-10-16 LAB — HEMOGLOBIN A1C
Hgb A1c MFr Bld: 5.4 % (ref 4.8–5.6)
Mean Plasma Glucose: 108.28 mg/dL

## 2022-10-16 LAB — CBC WITH DIFFERENTIAL/PLATELET
Abs Immature Granulocytes: 0.02 10*3/uL (ref 0.00–0.07)
Basophils Absolute: 0 10*3/uL (ref 0.0–0.1)
Basophils Relative: 1 %
Eosinophils Absolute: 0.5 10*3/uL (ref 0.0–0.5)
Eosinophils Relative: 8 %
HCT: 44.6 % (ref 39.0–52.0)
Hemoglobin: 14.6 g/dL (ref 13.0–17.0)
Immature Granulocytes: 0 %
Lymphocytes Relative: 32 %
Lymphs Abs: 2 10*3/uL (ref 0.7–4.0)
MCH: 30 pg (ref 26.0–34.0)
MCHC: 32.7 g/dL (ref 30.0–36.0)
MCV: 91.6 fL (ref 80.0–100.0)
Monocytes Absolute: 0.6 10*3/uL (ref 0.1–1.0)
Monocytes Relative: 9 %
Neutro Abs: 3.2 10*3/uL (ref 1.7–7.7)
Neutrophils Relative %: 50 %
Platelets: 321 10*3/uL (ref 150–400)
RBC: 4.87 MIL/uL (ref 4.22–5.81)
RDW: 12.7 % (ref 11.5–15.5)
WBC: 6.4 10*3/uL (ref 4.0–10.5)
nRBC: 0 % (ref 0.0–0.2)

## 2022-10-16 LAB — LIPID PANEL
Cholesterol: 164 mg/dL (ref 0–200)
HDL: 59 mg/dL (ref >40)
LDL Cholesterol: 79 mg/dL (ref 0–99)
Total CHOL/HDL Ratio: 2.8 RATIO
Triglycerides: 131 mg/dL (ref <150)
VLDL: 26 mg/dL (ref 0–40)

## 2022-10-16 LAB — SARS CORONAVIRUS 2 BY RT PCR: SARS Coronavirus 2 by RT PCR: NEGATIVE

## 2022-10-16 LAB — TSH: TSH: 0.766 u[IU]/mL (ref 0.350–4.500)

## 2022-10-16 MED ORDER — OLANZAPINE 5 MG PO TBDP
5.0000 mg | ORAL_TABLET | Freq: Three times a day (TID) | ORAL | Status: DC | PRN
  Administered 2022-10-16: 5 mg via ORAL
  Filled 2022-10-16: qty 1

## 2022-10-16 MED ORDER — MAGNESIUM HYDROXIDE 400 MG/5ML PO SUSP: 30.0000 mL | Freq: Every day | ORAL | Status: DC | PRN

## 2022-10-16 MED ORDER — HYDROXYZINE HCL 25 MG PO TABS
25.0000 mg | ORAL_TABLET | Freq: Three times a day (TID) | ORAL | Status: DC | PRN
  Administered 2022-10-16: 25 mg via ORAL
  Filled 2022-10-16 (×2): qty 1

## 2022-10-16 MED ORDER — ACETAMINOPHEN 325 MG PO TABS: 650.0000 mg | ORAL_TABLET | Freq: Four times a day (QID) | ORAL | Status: DC | PRN

## 2022-10-16 MED ORDER — ALUM & MAG HYDROXIDE-SIMETH 200-200-20 MG/5ML PO SUSP: 30.0000 mL | ORAL | Status: DC | PRN

## 2022-10-16 MED ORDER — TRAZODONE HCL 50 MG PO TABS
50.0000 mg | ORAL_TABLET | Freq: Every evening | ORAL | Status: DC | PRN
  Filled 2022-10-16: qty 1

## 2022-10-16 NOTE — ED Provider Notes (Signed)
[facility] Continuous Assessment Admission H&P  Date: 10/16/22 Patient Name: AUL DONABEDIAN MRN: [id number] Chief Complaint: "I'm using, I'm homeless, I need to go to a residential treatment"  Diagnoses:  Final diagnoses:  Polysubstance abuse [facility])   HPI: Pt presents to [facility] behavioral health with law enforcement escort under IVC petition. IVC petitioner is HOLDAN TIPLER, [phone], pt's father. Per IVC petition: RESPONDENT DIAGNOSED AT 16 WITH PARANOID SCHIZOPHRENIA AND BIPOLAR DISORDER. HE IS ALSO ABUSING METH. DUE TO HIS ILLNESS, RESPONDENT CANNOT HOLD A JOB AND RECEIVES SSI. HE USES MOST, IF NOT ALL, OF HIS SSI ON DRUGS. HE IS ALSO HOMELESS. LAST NIGHT HE MADE HIS WAY TO HIS DAD'S HOME AND SLEPT ON HIS CARPORT. RESPONDENT COMMUNICATES WITH OTHERS THAT ARE NOT PRESENT. HE IS HOSTILE AND AGGRESSIVE TO HIS DAD, BLAMING HIM FOR THE WAY HE IS. HE STATED HE WISHES HAD HAD NEVER COME INTO THE WORLD. HE ALSO STATED THAT HE WANTS TO "END IT" AND TOLD A FRIEND THAT HE HAD A GUN TO HIS MOUTH. HE IS CURRENTLY A DANGER TO SELF AND OTHERS.  Rosalee Kaufman, 42 y.o., male patient seen face to face by this provider; and chart reviewed on 10/16/22.  On evaluation ANWAR BAACK reports presenting today because "I'm using, I'm homeless, I need to go to a residential treatment". Pt reports he was picked up by law enforcement in front of [employer] on [address] and was embarrassed by being placed into handcuffs. He reports he has been using methamphetamines, cocaine, and nicotine. Last used methamphetamines on Sunday, typically uses 2 to 3 times/week. Last used cocaine "couple weeks ago", typically uses "not often". Last used nicotine (vaping/cigarettes) today, typically uses daily. He denies use of alcohol, opioids.  Pt reports his appetite and sleep are poor. He states he eats about 2 meals/day. He sleeps 2 to 3 hours/night.   Pt denies suicidal, homicidal or violent ideations. He  adamantly denies he told his father that he wants to "end it" or that he told a friend that he had a gun to his mouth. He states he does not have access to a gun. He denies auditory visual hallucinations or paranoia.   He reports highest level of education is [employer].  Pt reports he has been homeless "off and on" for about 8 years. He reports most recently he has been homeless for 1.5 months. He was previously living with a friend in [place].   Collateral w/ pt's father, ETHIAN JOVANOVIC, [phone]. Duffy Rhody reports he pursued IVC petition today because pt is "not trying to get help for issues that he has". When asked what he is referring to, pt's father reports "drug addiction, bipolar, schizophrenia". He states pt threatened his life on Sunday, when pt reached out to friend's mother, and told her about putting a gun to his mouth. Pt's father states there are guns in his home although pt has never tried to take any of them and to his knowledge pt does not have access to a gun. Pt's father reports pt once cut his arm while living with him 7 to 8 years ago, although denies knowledge of prior suicide attempts.   Total Time spent with patient: 45 minutes  Musculoskeletal  Strength & Muscle Tone: within normal limits Gait & Station: normal Patient leans: N/A  Psychiatric Specialty Exam  Presentation General Appearance:  Disheveled; Other (comment) (malodorous)  Eye Contact: Fair  Speech: Clear and Coherent; Normal Rate  Speech Volume: Normal  Handedness: Right   Mood and Affect  Mood: -- ("angry, sad, anxious")  Affect: Blunt   Thought Process  Thought Processes: Coherent; Goal Directed; Linear  Descriptions of Associations:Intact  Orientation:Full (Time, Place and Person)  Thought Content:Logical    Hallucinations:Hallucinations: None  Ideas of Reference:None  Suicidal Thoughts:Suicidal Thoughts: No  Homicidal Thoughts:Homicidal Thoughts: No   Sensorium   Memory: Immediate Good; Recent Good; Remote Good  Judgment: Intact  Insight: Present   Executive Functions  Concentration: Good  Attention Span: Good  Recall: Good  Fund of Knowledge: Good  Language: Good   Psychomotor Activity  Psychomotor Activity: Psychomotor Activity: Restlessness   Assets  Assets: Communication Skills; Desire for Improvement; Financial Resources/Insurance; Resilience   Sleep  Sleep: Sleep: Poor Number of Hours of Sleep: 0 (2 to 3 hours/night)   Nutritional Assessment (For OBS and FBC admissions only) Has the patient had a weight loss or gain of 10 pounds or more in the last 3 months?: No Has the patient had a decrease in food intake/or appetite?: No Does the patient have dental problems?: No Does the patient have eating habits or behaviors that may be indicators of an eating disorder including binging or inducing vomiting?: No Has the patient recently lost weight without trying?: 0 Has the patient been eating poorly because of a decreased appetite?: 1 Malnutrition Screening Tool Score: 1    Physical Exam Constitutional:      General: He is not in acute distress.    Appearance: He is not ill-appearing, toxic-appearing or diaphoretic.  Eyes:     General: No scleral icterus. Cardiovascular:     Rate and Rhythm: Normal rate.  Pulmonary:     Effort: Pulmonary effort is normal.  Neurological:     Mental Status: He is alert and oriented to person, place, and time.  Psychiatric:        Attention and Perception: Attention and perception normal.        Mood and Affect: Mood is anxious. Affect is blunt.        Speech: Speech normal.        Behavior: Behavior is cooperative.        Thought Content: Thought content normal.        Cognition and Memory: Cognition and memory normal.        Judgment: Judgment normal.    Review of Systems  Constitutional:  Negative for chills and fever.  Respiratory:  Negative for shortness of  breath.   Cardiovascular:  Negative for chest pain and palpitations.  Gastrointestinal:  Negative for abdominal pain.  Neurological:  Negative for headaches.  Psychiatric/Behavioral:  Positive for substance abuse. The patient is nervous/anxious.     Blood pressure 124/77, pulse 78, temperature 98.7 F (37.1 C), temperature source Oral, resp. rate 18, SpO2 99 %. There is no height or weight on file to calculate BMI.  Past Psychiatric History: Per chart review, history of bipolar 1 disorder, substance abuse   Is the patient at risk to self? Per IVC Has the patient been a risk to self in the past 6 months? Yes .    Has the patient been a risk to self within the distant past? Yes   Is the patient a risk to others? No   Has the patient been a risk to others in the past 6 months? No   Has the patient been a risk to others within the distant past? No   Past Medical History: Per chart review, history of  heart murmur  Family History: Pt states "probably" although he is not aware of any diagnoses  Social History: Homeless  Last Labs:  No visits with results within 6 Month(s) from this visit.  Latest known visit with results is:  Admission on 03/05/2022, Discharged on 03/08/2022  Component Date Value Ref Range Status   SARS Coronavirus 2 by RT PCR 03/05/2022 NEGATIVE  NEGATIVE Final   Comment: (NOTE) SARS-CoV-2 target nucleic acids are NOT DETECTED.  The SARS-CoV-2 RNA is generally detectable in upper and lower respiratory specimens during the acute phase of infection. The lowest concentration of SARS-CoV-2 viral copies this assay can detect is 250 copies / mL. A negative result does not preclude SARS-CoV-2 infection and should not be used as the sole basis for treatment or other patient management decisions.  A negative result may occur with improper specimen collection / handling, submission of specimen other than nasopharyngeal swab, presence of viral mutation(s) within the areas  targeted by this assay, and inadequate number of viral copies (<250 copies / mL). A negative result must be combined with clinical observations, patient history, and epidemiological information.  Fact Sheet for Patients:   RoadLapTop.co.za  Fact Sheet for Healthcare Providers: http://kim-miller.com/  This test is not yet approved or                           cleared by the [place] FDA and has been authorized for detection and/or diagnosis of SARS-CoV-2 by FDA under an Emergency Use Authorization (EUA).  This EUA will remain in effect (meaning this test can be used) for the duration of the COVID-19 declaration under Section 564(b)(1) of the Act, 21 U.S.C. section 360bbb-3(b)(1), unless the authorization is terminated or revoked sooner.  Performed at [facility], [address], [place], [place] [address]    WBC 03/06/2022 6.7  4.0 - 10.5 K/uL Final   RBC 03/06/2022 5.35  4.22 - 5.81 MIL/uL Final   Hemoglobin 03/06/2022 16.5  13.0 - 17.0 g/dL Final   HCT 29/56/2130 49.3  39.0 - 52.0 % Final   MCV 03/06/2022 92.1  80.0 - 100.0 fL Final   MCH 03/06/2022 30.8  26.0 - 34.0 pg Final   MCHC 03/06/2022 33.5  30.0 - 36.0 g/dL Final   RDW 86/57/8469 13.1  11.5 - 15.5 % Final   Platelets 03/06/2022 268  150 - 400 K/uL Final   nRBC 03/06/2022 0.0  0.0 - 0.2 % Final   Performed at [facility], [address], [place], [place] [address]   Sodium 03/06/2022 137  135 - 145 mmol/L Final   Potassium 03/06/2022 3.9  3.5 - 5.1 mmol/L Final   Chloride 03/06/2022 102  98 - 111 mmol/L Final   CO2 03/06/2022 29  22 - 32 mmol/L Final   Glucose, Bld 03/06/2022 110 (H)  70 - 99 mg/dL Final   Glucose reference range applies only to samples taken after fasting for at least 8 hours.   BUN 03/06/2022 22 (H)  6 - 20 mg/dL Final   Creatinine, Ser 03/06/2022 1.10  0.61 - 1.24 mg/dL Final   Calcium 84/13/2440 9.3  8.9 -  10.3 mg/dL Final   Total Protein 03/31/2535 8.3 (H)  6.5 - 8.1 g/dL Final   Albumin 64/40/3474 4.7  3.5 - 5.0 g/dL Final   AST 25/95/6387 33  15 - 41 U/L Final   ALT 03/06/2022 63 (H)  0 - 44 U/L  Final   Alkaline Phosphatase 03/06/2022 67  38 - 126 U/L Final   Total Bilirubin 03/06/2022 1.1  0.3 - 1.2 mg/dL Final   GFR, Estimated 03/06/2022 >60  >60 mL/min Final   Comment: (NOTE) Calculated using the CKD-EPI Creatinine Equation (2021)    Anion gap 03/06/2022 6  5 - 15 Final   Performed at [facility], [address] [place], [place] [address]   Hgb A1c MFr Bld 03/06/2022 5.0  4.8 - 5.6 % Final   Comment: (NOTE) Pre diabetes:          5.7%-6.4%  Diabetes:              >6.4%  Glycemic control for   <7.0% adults with diabetes    Mean Plasma Glucose 03/06/2022 96.8  mg/dL Final   Performed at [facility], [address] [place], [place] [address]   Cholesterol 03/06/2022 217 (H)  0 - 200 mg/dL Final   Triglycerides 29/56/2130 98  <150 mg/dL Final   HDL 86/57/8469 67  >40 mg/dL Final   Total CHOL/HDL Ratio 03/06/2022 3.2  RATIO Final   VLDL 03/06/2022 20  0 - 40 mg/dL Final   LDL Cholesterol 03/06/2022 130 (H)  0 - 99 mg/dL Final   Comment:        Total Cholesterol/HDL:CHD Risk Coronary Heart Disease Risk Table                     Men   Women  1/2 Average Risk   3.4   3.3  Average Risk       5.0   4.4  2 X Average Risk   9.6   7.1  3 X Average Risk  23.4   11.0        Use the calculated Patient Ratio above and the CHD Risk Table to determine the patient's CHD Risk.        ATP III CLASSIFICATION (LDL):  <100     mg/dL   Optimal  629-528  mg/dL   Near or Above                    Optimal  130-159  mg/dL   Borderline  413-244  mg/dL   High  >010     mg/dL   Very High Performed at [facility], [address] [place], [place] [address]    TSH 03/06/2022 1.849  0.350 - 4.500 uIU/mL Final   Comment: Performed by a 3rd  Generation assay with a functional sensitivity of <=0.01 uIU/mL. Performed at [facility], [address], [place], [place] [address]    Valproic Acid Lvl 03/06/2022 <10 (L)  50.0 - 100.0 ug/mL Final   Comment: RESULT CONFIRMED BY MANUAL DILUTION Performed at [facility], [address], [place], [place] [address]     Allergies: Patient has no known allergies.  Medications:  Facility Ordered Medications  Medication   acetaminophen (TYLENOL) tablet 650 mg   alum & mag hydroxide-simeth (MAALOX/MYLANTA) 200-200-20 MG/5ML suspension 30 mL   magnesium hydroxide (MILK OF MAGNESIA) suspension 30 mL   hydrOXYzine (ATARAX) tablet 25 mg   traZODone (DESYREL) tablet 50 mg   PTA Medications  Medication Sig   divalproex (DEPAKOTE) 500 MG DR tablet Take 500 mg by mouth 2 (two) times daily.   albuterol (VENTOLIN HFA) 108 (90 Base) MCG/ACT inhaler Inhale 2 puffs into the lungs every 6 (six) hours  as needed.   OLANZapine (ZYPREXA) 10 MG tablet Take 1 tablet (10 mg total) by mouth at bedtime.   FLUoxetine (PROZAC) 20 MG capsule Take 1 capsule (20 mg total) by mouth every morning.   sildenafil (VIAGRA) 50 MG tablet Take 1 tablet (50 mg total) by mouth daily as needed for erectile dysfunction.   busPIRone (BUSPAR) 7.5 MG tablet TAKE 1 TABLET BY MOUTH TWICE DAILY   Medical Decision Making  Pt is a 42 y/o male w/ history of bipolar 1 disorder, depression, substance abuse, heart murmur presenting to [facility] under IVC petitioned by his father. Pt recommended for continuous assessment. Consider FBC admission.  Lab Orders         SARS Coronavirus 2 by RT PCR (hospital order, performed in [facility] hospital lab) *cepheid single result test* Anterior Nasal Swab         CBC with Differential/Platelet         Comprehensive metabolic panel         Hemoglobin A1c         Lipid panel         TSH         POCT Urine Drug Screen - (I-Screen)     Meds ordered this  encounter  Medications   acetaminophen (TYLENOL) tablet 650 mg   alum & mag hydroxide-simeth (MAALOX/MYLANTA) 200-200-20 MG/5ML suspension 30 mL   magnesium hydroxide (MILK OF MAGNESIA) suspension 30 mL   hydrOXYzine (ATARAX) tablet 25 mg   traZODone (DESYREL) tablet 50 mg   OLANZapine zydis (ZYPREXA) disintegrating tablet 5 mg   Recommendations  Based on my evaluation the patient does not appear to have an emergency medical condition.  Lauree Chandler, NP 10/16/22  2:19 PM

## 2022-10-16 NOTE — ED Notes (Signed)
Pt refused dinner

## 2022-10-16 NOTE — ED Notes (Signed)
Pt is currently sleeping, no distress noted, environmental check complete, will continue to monitor patient for safety.  

## 2022-10-16 NOTE — ED Notes (Signed)
Pt sleeping on recliner bed. Denies SI/HI/AVH @ present time. Denies s/s of withdrawal. No noted distress. Will continue to monitor for safety

## 2022-10-16 NOTE — Progress Notes (Signed)
   10/16/22 1306  [facility] Triage Screening (Walk-ins at [facility] only)  How Did You Hear About [place]? Legal System  What Is the Reason for Your Visit/Call Today? Pt presents to [facility] under IVC escorted by [employer]. Per IVC "Respondent diagnosed at 16 paranoid schizophrenia and bipolar disorder. He is also abusing meth. Due to his illness, respondent cannot hold a job and receives SSI. He uses most, if not all, of his SSI on drugs. He is also homeless. Lastnight he made his way to his dad's home and slept on his carport. Respondent communicates with others that are not present. He is hostile and aggressive to his dad, blaming him for the way he is. He stated  he wishes he had never come into the world. He also stated that he wants to "end it all" and told a friend that he had a gun to his mouth, He is currently a danger to self and others". Pt reports meth use atleast 1x a week, about 2 grams. Pt reports last use was sunday "about 1 line". Pt reports Bipolar disorder diagnosis but has been without medication for about 8 months. Pt denies SI/HI and AVH  How Long Has This Been Causing You Problems? > than 6 months  Have You Recently Had Any Thoughts About Hurting Yourself? No  Are You Planning to Commit Suicide/Harm Yourself At This time? No  Have you Recently Had Thoughts About Hurting Someone Karolee Ohs? No  Are You Planning To Harm Someone At This Time? No  Are you currently experiencing any auditory, visual or other hallucinations? No  Have You Used Any Alcohol or Drugs in the Past 24 Hours? No (last use sunday)  Do you have any current medical co-morbidities that require immediate attention? No  Clinician description of patient physical appearance/behavior: agitated but cooperative  What Do You Feel Would Help You the Most Today? Treatment for Depression or other mood problem  If access to [facility] was not available, would you have sought care in the Emergency Department? No  Determination of Need Urgent (48  hours)  Options For Referral [facility] Urgent Care;Medication Management

## 2022-10-17 DIAGNOSIS — F1910 Other psychoactive substance abuse, uncomplicated: Secondary | ICD-10-CM | POA: Diagnosis not present

## 2022-10-17 MED ORDER — ZIPRASIDONE MESYLATE 20 MG IM SOLR: 20.0000 mg | INTRAMUSCULAR | Status: DC | PRN

## 2022-10-17 MED ORDER — LORAZEPAM 1 MG PO TABS: 1.0000 mg | ORAL_TABLET | ORAL | Status: DC | PRN

## 2022-10-17 MED ORDER — OLANZAPINE 10 MG PO TBDP: 10.0000 mg | ORAL_TABLET | Freq: Three times a day (TID) | ORAL | Status: DC | PRN

## 2022-10-17 NOTE — ED Notes (Signed)
Patient  sleeping in no acute stress. RR even and unlabored .Environment secured .Will continue to monitor for safely. 

## 2022-10-17 NOTE — ED Notes (Signed)
Patient alert and oriented x 3. Denies SI/HI/AVH. Denies intent or plan to harm self or others. Routine conducted according to faculty protocol. Encourage patient to notify staff with any needs or concerns. Patient verbalized agreement and understanding. Will continue to monitor for safety. 

## 2022-10-17 NOTE — ED Notes (Signed)
Pt is currently sleeping, no distress noted, environmental check complete, will continue to monitor patient for safety.  

## 2022-10-17 NOTE — ED Provider Notes (Signed)
[facility]BC/OBS ASAP Discharge Summary  Date and Time: 10/17/2022 8:13 AM  Name: [name]  MRN:  [id number]   Discharge Diagnoses:  Final diagnoses:  Polysubstance abuse (HCC)   Subjective:   On reassessment, pt continues to deny SI/VI/HI, AVH, paranoia. Endorses good sleep last night, feeling well rested. Discussed admission to [facility], pt declined at this time. He states he prefers to find residential substance use treatment on his own. He is also considering interviewing at an [facility]. He states he knows staff member at [facility]" and can reach out to her. He has been to [facility] last year. Discussed with pt recommendation to abstain from substance use. Pt verbalized understanding.  Stay Summary:  Pt is a 42 y/o male, per chart review, history of bipolar 1 disorder, substance abuse, presenting to [facility] on 10/16/22 under IVC petitioned by his father. He denies SI/VI/HI, AVH, paranoia. He declines admission to [facility]. IVC first exam completed and rescinded. Pt discharged.   Total Time spent with patient: 30 minutes  Past Psychiatric History: Per chart review, history of bipolar 1 disorder, substance abuse Past Medical History: Per chart review, history of heart murmur Family History: Pt states "probably" although he is not aware of any diagnoses Social History: Homeless Tobacco Cessation:  A prescription for an FDA-approved tobacco cessation medication was offered at discharge and the patient refused  Current Medications:  Current Facility-Administered Medications  Medication Dose Route Frequency Provider Last Rate Last Admin   acetaminophen (TYLENOL) tablet 650 mg  650 mg Oral Q6H PRN Lauree Chandler, NP       alum & mag hydroxide-simeth (MAALOX/MYLANTA) 200-200-20 MG/5ML suspension 30 mL  30 mL Oral Q4H PRN Lauree Chandler, NP       hydrOXYzine (ATARAX) tablet 25 mg  25 mg Oral TID PRN Lauree Chandler, NP   25 mg at 10/16/22 1450   OLANZapine zydis (ZYPREXA)  disintegrating tablet 10 mg  10 mg Oral Q8H PRN Sindy Guadeloupe, NP       And   LORazepam (ATIVAN) tablet 1 mg  1 mg Oral PRN Sindy Guadeloupe, NP       And   ziprasidone (GEODON) injection 20 mg  20 mg Intramuscular PRN Sindy Guadeloupe, NP       magnesium hydroxide (MILK OF MAGNESIA) suspension 30 mL  30 mL Oral Daily PRN Lauree Chandler, NP       OLANZapine zydis (ZYPREXA) disintegrating tablet 5 mg  5 mg Oral Q8H PRN Lauree Chandler, NP   5 mg at 10/16/22 1450   traZODone (DESYREL) tablet 50 mg  50 mg Oral QHS PRN Lauree Chandler, NP       Current Outpatient Medications  Medication Sig Dispense Refill   albuterol (VENTOLIN HFA) 108 (90 Base) MCG/ACT inhaler Inhale 2 puffs into the lungs every 6 (six) hours as needed.     busPIRone (BUSPAR) 7.5 MG tablet TAKE 1 TABLET BY MOUTH TWICE DAILY 60 tablet 0   divalproex (DEPAKOTE) 500 MG DR tablet Take 500 mg by mouth 2 (two) times daily.     FLUoxetine (PROZAC) 20 MG capsule Take 1 capsule (20 mg total) by mouth every morning. 30 capsule 0   OLANZapine (ZYPREXA) 10 MG tablet Take 1 tablet (10 mg total) by mouth at bedtime. 30 tablet 0   sildenafil (VIAGRA) 50 MG tablet Take 1 tablet (50 mg total) by mouth daily as needed for erectile dysfunction. 30 tablet 0  PTA Medications:  Facility Ordered Medications  Medication   acetaminophen (TYLENOL) tablet 650 mg   alum & mag hydroxide-simeth (MAALOX/MYLANTA) 200-200-20 MG/5ML suspension 30 mL   magnesium hydroxide (MILK OF MAGNESIA) suspension 30 mL   hydrOXYzine (ATARAX) tablet 25 mg   traZODone (DESYREL) tablet 50 mg   OLANZapine zydis (ZYPREXA) disintegrating tablet 5 mg   OLANZapine zydis (ZYPREXA) disintegrating tablet 10 mg   And   LORazepam (ATIVAN) tablet 1 mg   And   ziprasidone (GEODON) injection 20 mg   PTA Medications  Medication Sig   albuterol (VENTOLIN HFA) 108 (90 Base) MCG/ACT inhaler Inhale 2 puffs into the lungs every 6 (six) hours as needed.   OLANZapine  (ZYPREXA) 10 MG tablet Take 1 tablet (10 mg total) by mouth at bedtime.   FLUoxetine (PROZAC) 20 MG capsule Take 1 capsule (20 mg total) by mouth every morning.   sildenafil (VIAGRA) 50 MG tablet Take 1 tablet (50 mg total) by mouth daily as needed for erectile dysfunction.   busPIRone (BUSPAR) 7.5 MG tablet TAKE 1 TABLET BY MOUTH TWICE DAILY   divalproex (DEPAKOTE) 500 MG DR tablet Take 500 mg by mouth 2 (two) times daily.       11/14/2021    1:01 PM 11/13/2021    2:27 PM  Depression screen PHQ 2/9  Decreased Interest 2 0  Down, Depressed, Hopeless 2 1  PHQ - 2 Score 4 1  Altered sleeping 0   Tired, decreased energy 0   Change in appetite 1   Feeling bad or failure about yourself  0   Trouble concentrating 0   Moving slowly or fidgety/restless 0   Suicidal thoughts 0   PHQ-9 Score 5   Difficult doing work/chores Somewhat difficult     Flowsheet Row ED from 10/16/2022 in [facility] Admission (Discharged[facility]  C-SSRS RISK CATEGORY No Risk Low Risk Low Risk       Musculoskeletal  Strength & Muscle Tone: within normal limits Gait & Station: normal Patient leans: N/A  Psychiatric Specialty Exam  Presentation  General Appearance:  Disheveled; Other (comment) (malodorous)  Eye Contact: Fair  Speech: Clear and Coherent; Normal Rate  Speech Volume: Normal  Handedness: Right   Mood and Affect  Mood: -- ("angry, sad, anxious")  Affect: Blunt   Thought Process  Thought Processes: Coherent; Goal Directed; Linear  Descriptions of Associations:Intact  Orientation:Full (Time, Place and Person)  Thought Content:Logical     Hallucinations:Hallucinations: None  Ideas of Reference:None  Suicidal Thoughts:Suicidal Thoughts: No  Homicidal Thoughts:Homicidal Thoughts: No   Sensorium   Memory: Immediate Good; Recent Good; Remote Good  Judgment: Intact  Insight: Present   Executive Functions  Concentration: Good  Attention Span: Good  Recall: Good  Fund of Knowledge: Good  Language: Good   Psychomotor Activity  Psychomotor Activity: Psychomotor Activity: Restlessness   Assets  Assets: Communication Skills; Desire for Improvement; Financial Resources/Insurance; Resilience   Sleep  Sleep: Sleep: Poor Number of Hours of Sleep: 0 (2 to 3 hours/night)   Nutritional Assessment (For OBS and [facility] admissions only) Has the patient had a weight loss or gain of 10 pounds or more in the last 3 months?: No Has the patient had a decrease in food intake/or appetite?: No Does the patient have dental problems?: No Does the patient have eating habits or behaviors that may  be indicators of an eating disorder including binging or inducing vomiting?: No Has the patient recently lost weight without trying?: 0 Has the patient been eating poorly because of a decreased appetite?: 1 Malnutrition Screening Tool Score: 1    Physical Exam  Physical Exam Constitutional:      General: He is not in acute distress.    Appearance: He is not ill-appearing, toxic-appearing or diaphoretic.  Eyes:     General: No scleral icterus. Cardiovascular:     Rate and Rhythm: Bradycardia present.  Pulmonary:     Effort: Pulmonary effort is normal. No respiratory distress.  Neurological:     Mental Status: He is alert and oriented to person, place, and time.  Psychiatric:        Attention and Perception: Attention and perception normal.        Mood and Affect: Mood normal. Affect is blunt.        Speech: Speech normal.        Behavior: Behavior normal. Behavior is cooperative.        Thought Content: Thought content normal.        Cognition and Memory: Cognition and memory normal.        Judgment: Judgment normal.    Review of Systems  Constitutional:  Negative for  chills and fever.  Respiratory:  Negative for shortness of breath.   Cardiovascular:  Negative for chest pain and palpitations.  Gastrointestinal:  Negative for abdominal pain.  Neurological:  Negative for headaches.  Psychiatric/Behavioral:  Positive for substance abuse.    Blood pressure (!) 115/55, pulse (!) 52, temperature 97.7 F (36.5 C), temperature source Oral, resp. rate 16, SpO2 96 %. There is no height or weight on file to calculate BMI.  Demographic Factors:  Male, Caucasian, Low socioeconomic status, and Unemployed  Loss Factors: Financial problems/change in socioeconomic status  Historical Factors: Per pt, "probably" family history  Risk Reduction Factors:   NA  Continued Clinical Symptoms:  Alcohol/Substance Abuse/Dependencies  Cognitive Features That Contribute To Risk:  None    Suicide Risk:  Minimal: No identifiable suicidal ideation.  Patients presenting with no risk factors but with morbid ruminations; may be classified as minimal risk based on the severity of the depressive symptoms  Plan Of Care/Follow-up recommendations:  Residential substance use treatment Outpatient psychiatry and counseling  Disposition:  Discharge  Lauree Chandler, NP 10/17/2022, 8:13 AM

## 2022-10-17 NOTE — ED Notes (Signed)
Patient A&O x 4, ambulatory. Patient discharged in no acute distress. Patient denied SI/HI, A/VH upon discharge. Patient verbalized understanding of all discharge instructions explained by staff, to include follow up appointments, RX's and safety plan. Patient reported mood 10/10.  Pt belongings returned to patient from locker  intact. Patient escorted to lobby via staff for transport to destination. Safety maintained. Patients ivc was resended per provider

## 2022-10-17 NOTE — Discharge Instructions (Addendum)

## 2023-03-14 DIAGNOSIS — Z13220 Encounter for screening for lipoid disorders: Secondary | ICD-10-CM | POA: Diagnosis not present

## 2023-03-14 DIAGNOSIS — R350 Frequency of micturition: Secondary | ICD-10-CM | POA: Diagnosis not present

## 2023-03-14 DIAGNOSIS — N3281 Overactive bladder: Secondary | ICD-10-CM | POA: Diagnosis not present

## 2023-03-14 DIAGNOSIS — R030 Elevated blood-pressure reading, without diagnosis of hypertension: Secondary | ICD-10-CM | POA: Diagnosis not present

## 2023-03-14 DIAGNOSIS — Z136 Encounter for screening for cardiovascular disorders: Secondary | ICD-10-CM | POA: Diagnosis not present

## 2023-03-14 DIAGNOSIS — E559 Vitamin D deficiency, unspecified: Secondary | ICD-10-CM | POA: Diagnosis not present

## 2023-03-14 DIAGNOSIS — R5383 Other fatigue: Secondary | ICD-10-CM | POA: Diagnosis not present

## 2023-03-14 DIAGNOSIS — R0683 Snoring: Secondary | ICD-10-CM | POA: Diagnosis not present

## 2023-03-14 DIAGNOSIS — R011 Cardiac murmur, unspecified: Secondary | ICD-10-CM | POA: Diagnosis not present

## 2023-03-19 DIAGNOSIS — R350 Frequency of micturition: Secondary | ICD-10-CM | POA: Diagnosis not present

## 2023-03-19 DIAGNOSIS — R030 Elevated blood-pressure reading, without diagnosis of hypertension: Secondary | ICD-10-CM | POA: Diagnosis not present

## 2023-03-19 DIAGNOSIS — Z136 Encounter for screening for cardiovascular disorders: Secondary | ICD-10-CM | POA: Diagnosis not present

## 2023-03-19 DIAGNOSIS — E559 Vitamin D deficiency, unspecified: Secondary | ICD-10-CM | POA: Diagnosis not present

## 2023-03-19 DIAGNOSIS — R5383 Other fatigue: Secondary | ICD-10-CM | POA: Diagnosis not present

## 2023-04-15 DIAGNOSIS — R051 Acute cough: Secondary | ICD-10-CM | POA: Diagnosis not present

## 2023-04-15 DIAGNOSIS — T148XXA Other injury of unspecified body region, initial encounter: Secondary | ICD-10-CM | POA: Diagnosis not present

## 2023-04-16 DIAGNOSIS — Z23 Encounter for immunization: Secondary | ICD-10-CM | POA: Diagnosis not present
